# Patient Record
Sex: Female | Born: 1995 | Race: Black or African American | Hispanic: No | Marital: Single | Smoking: Never smoker
Health system: Southern US, Community
[De-identification: ages and names within clinical notes are randomized; demographics above are authoritative.]

## PROBLEM LIST (undated history)

## (undated) ENCOUNTER — Ambulatory Visit (HOSPITAL_COMMUNITY): Admission: EM | Source: Home / Self Care

## (undated) DIAGNOSIS — J45909 Unspecified asthma, uncomplicated: Secondary | ICD-10-CM

---
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
## 2017-02-22 ENCOUNTER — Emergency Department (HOSPITAL_COMMUNITY)

## 2017-02-22 ENCOUNTER — Encounter (HOSPITAL_COMMUNITY): Payer: Self-pay | Admitting: Emergency Medical Services

## 2017-02-22 ENCOUNTER — Emergency Department (HOSPITAL_COMMUNITY)
Admission: EM | Admit: 2017-02-22 | Discharge: 2017-02-22 | Disposition: A | Discharge status: Home / Self Care | Attending: Emergency Medicine | Admitting: Emergency Medicine

## 2017-02-22 ENCOUNTER — Other Ambulatory Visit: Payer: Self-pay

## 2017-02-22 DIAGNOSIS — R059 Cough, unspecified: Secondary | ICD-10-CM

## 2017-02-22 DIAGNOSIS — R05 Cough: Secondary | ICD-10-CM | POA: Diagnosis present

## 2017-02-22 MED ORDER — DEXAMETHASONE SODIUM PHOSPHATE 10 MG/ML IJ SOLN
10.0000 mg | Freq: Once | INTRAMUSCULAR | Status: AC
  Administered 2017-02-22: 10 mg via INTRAMUSCULAR
  Filled 2017-02-22: qty 1

## 2017-02-22 MED ORDER — ALBUTEROL SULFATE HFA 108 (90 BASE) MCG/ACT IN AERS
2.0000 | INHALATION_SPRAY | Freq: Once | RESPIRATORY_TRACT | Status: AC
  Administered 2017-02-22: 2 via RESPIRATORY_TRACT
  Filled 2017-02-22: qty 6.7

## 2017-02-22 MED ORDER — IPRATROPIUM-ALBUTEROL 0.5-2.5 (3) MG/3ML IN SOLN
3.0000 mL | Freq: Once | RESPIRATORY_TRACT | Status: AC
  Administered 2017-02-22: 3 mL via RESPIRATORY_TRACT
  Filled 2017-02-22: qty 3

## 2017-02-22 NOTE — Discharge Instructions (Signed)
2 puffs of albuterol inhaler every 4hrs.  Your chest xray was reassuring.   Please call and schedule appointment with [facility] to establish care and for follow up.  Return to the ER if you have shortness of breath that does not improve with inhaler or have any new or worsening symptoms.

## 2017-02-22 NOTE — ED Triage Notes (Signed)
Patient presents with cough, runny nose and diarrhea x 10 days. Productive cough.

## 2017-02-22 NOTE — ED Notes (Signed)
Patient transported to X-ray 

## 2017-02-22 NOTE — ED Provider Notes (Signed)
[facility]   CSN: [id number] Arrival date & time: 02/22/17  [id number]     History   Chief Complaint Chief Complaint  Patient presents with  . Cough    HPI Kaitlyn Mcneil is a 22 y.o. female.  HPI  Kaitlyn Mcneil is a [place]21yo female with a history of asthma, allergic rhinitis and eczema who presents to the emergency department for evaluation of cough.  Patient states that her cough began about 10 days ago and is productive of a clear mucus.  States that she has some associated wheezing and has been needing to use her albuterol inhaler more often than usual (approximately 2 times a day.)  She normally does not have to use her albuterol inhaler.  States that this morning she felt short of breath when she woke up, which concerned her.  States that the albuterol helped with her shortness of breath.  States that she also has some nasal congestion.  Reports diarrhea about twice a day for the past several days. Denies recent travel outside of the country or recent antibiotic use. She denies fevers, chills, sore throat, ear pain, body aches, chest pain, abdominal pain, nausea/vomiting, hematochezia, melena, dysuria, urinary frequency, pelvic pain,  No past medical history on file.  There are no active problems to display for this patient.   History reviewed. No pertinent surgical history.  OB History    No data available       Home Medications    Prior to Admission medications   Not on File    Family History No family history on file.  Social History Social History   Tobacco Use  . Smoking status: Not on file  Substance Use Topics  . Alcohol use: Not on file  . Drug use: Not on file     Allergies   Shellfish allergy   Review of Systems Review of Systems  Constitutional: Negative for chills and fever.  HENT: Positive for congestion. Negative for sore throat and trouble swallowing.   Respiratory: Positive for cough,  shortness of breath and wheezing.   Cardiovascular: Negative for chest pain and leg swelling.  Gastrointestinal: Positive for diarrhea. Negative for abdominal pain, blood in stool, nausea and vomiting.  Genitourinary: Negative for difficulty urinating, dysuria, flank pain, frequency and pelvic pain.  Musculoskeletal: Negative for gait problem.  Neurological: Negative for headaches.     Physical Exam Updated Vital Signs BP 131/80 (BP Location: Right Arm)   Pulse (!) 115   Temp 99 F (37.2 C) (Oral)   Ht 4' 11.5" (1.511 m)   Wt 50.3 kg (111 lb)   LMP 12/20/2016 (Approximate) Comment: double shielded pt for CXR  SpO2 100%   BMI 22.04 kg/m   Physical Exam  Constitutional: She is oriented to person, place, and time. She appears well-developed and well-nourished. No distress.  HENT:  Head: Normocephalic and atraumatic.  Mouth/Throat: Oropharynx is clear and moist. No oropharyngeal exudate.  Eyes: Conjunctivae are normal. Pupils are equal, round, and reactive to light. Right eye exhibits no discharge. Left eye exhibits no discharge.  Neck: Normal range of motion. Neck supple. No JVD present. No tracheal deviation present.  Cardiovascular: Normal rate, regular rhythm and intact distal pulses. Exam reveals no friction rub.  No murmur heard. Pulmonary/Chest: Effort normal. No respiratory distress.  No respiratory distress.  Speaking in full sentences.  Good air movement.  Inspiratory and expiratory wheezing heard in all lung fields.  No crackles, stridor or  rhonchi.  Abdominal: Soft. Bowel sounds are normal. There is no tenderness. There is no guarding.  Musculoskeletal: Normal range of motion.  Neurological: She is alert and oriented to person, place, and time. Coordination normal.  Skin: Skin is warm and dry. Capillary refill takes less than 2 seconds. She is not diaphoretic.  Psychiatric: She has a normal mood and affect. Her behavior is normal.  Nursing note and vitals  reviewed.    ED Treatments / Results  Labs (all labs ordered are listed, but only abnormal results are displayed) Labs Reviewed - No data to display  EKG  EKG Interpretation None       Radiology Dg Chest 2 View  Result Date: 02/22/2017 CLINICAL DATA:  Productive cough EXAM: CHEST  2 VIEW COMPARISON:  None. FINDINGS: The heart size and mediastinal contours are within normal limits. Both lungs are clear. The visualized skeletal structures are unremarkable. IMPRESSION: No active cardiopulmonary disease. Electronically Signed   By: Marlan Palau M.D.   On: 02/22/2017 11:04    Procedures Procedures (including critical care time)  Medications Ordered in ED Medications  ipratropium-albuterol (DUONEB) 0.5-2.5 (3) MG/3ML nebulizer solution 3 mL (3 mLs Nebulization Given 02/22/17 1226)  dexamethasone (DECADRON) injection 10 mg (10 mg Intramuscular Given 02/22/17 1227)  albuterol (PROVENTIL HFA;VENTOLIN HFA) 108 (90 Base) MCG/ACT inhaler 2 puff (2 puffs Inhalation Given 02/22/17 1226)     Initial Impression / Assessment and Plan / ED Course  I have reviewed the triage vital signs and the nursing notes.  Pertinent labs & imaging results that were available during my care of the patient were reviewed by me and considered in my medical decision making (see chart for details).    CXR without acute abnormality. Patient ambulated in ED with O2 saturations maintained >90, no signs of respiratory distress. Lung exam improved after nebulizer treatment. She is mildly tachycardic (pulse 115), likely related to albuterol breathing treatment.  Pt states they she is breathing at baseline.  Decadron given in the ED. Pt has been instructed to continue using prescribed albuterol as needed. She does not have a PCP, have provided her with information to establish care with [facility] in her discharge paperwork. In terms of patient's diarrhea, no signs of dehydration. No abdominal tenderness on exam. She  reports 1-2 episodes of diarrhea daily. Given presentation, do not suspect infectious diarrhea, diverticulitis, colitis. Have counseled her to use imodium as needed for diarrhea and follow up with PCP. Counseled patient on return precautions. Patient agrees with plan and voices understanding to the above plan.   Final Clinical Impressions(s) / ED Diagnoses   Final diagnoses:  Cough    ED Discharge Orders    None       Lawrence Marseilles 02/22/17 1827    Alvira Monday, MD 02/22/17 2148

## 2019-04-05 IMAGING — DX DG CHEST 2V
2 series · 2 of 2 positions shown · non-contrast
Comparison: None.

CLINICAL DATA: Productive cough

EXAM:
CHEST  2 VIEW

[chest pa]
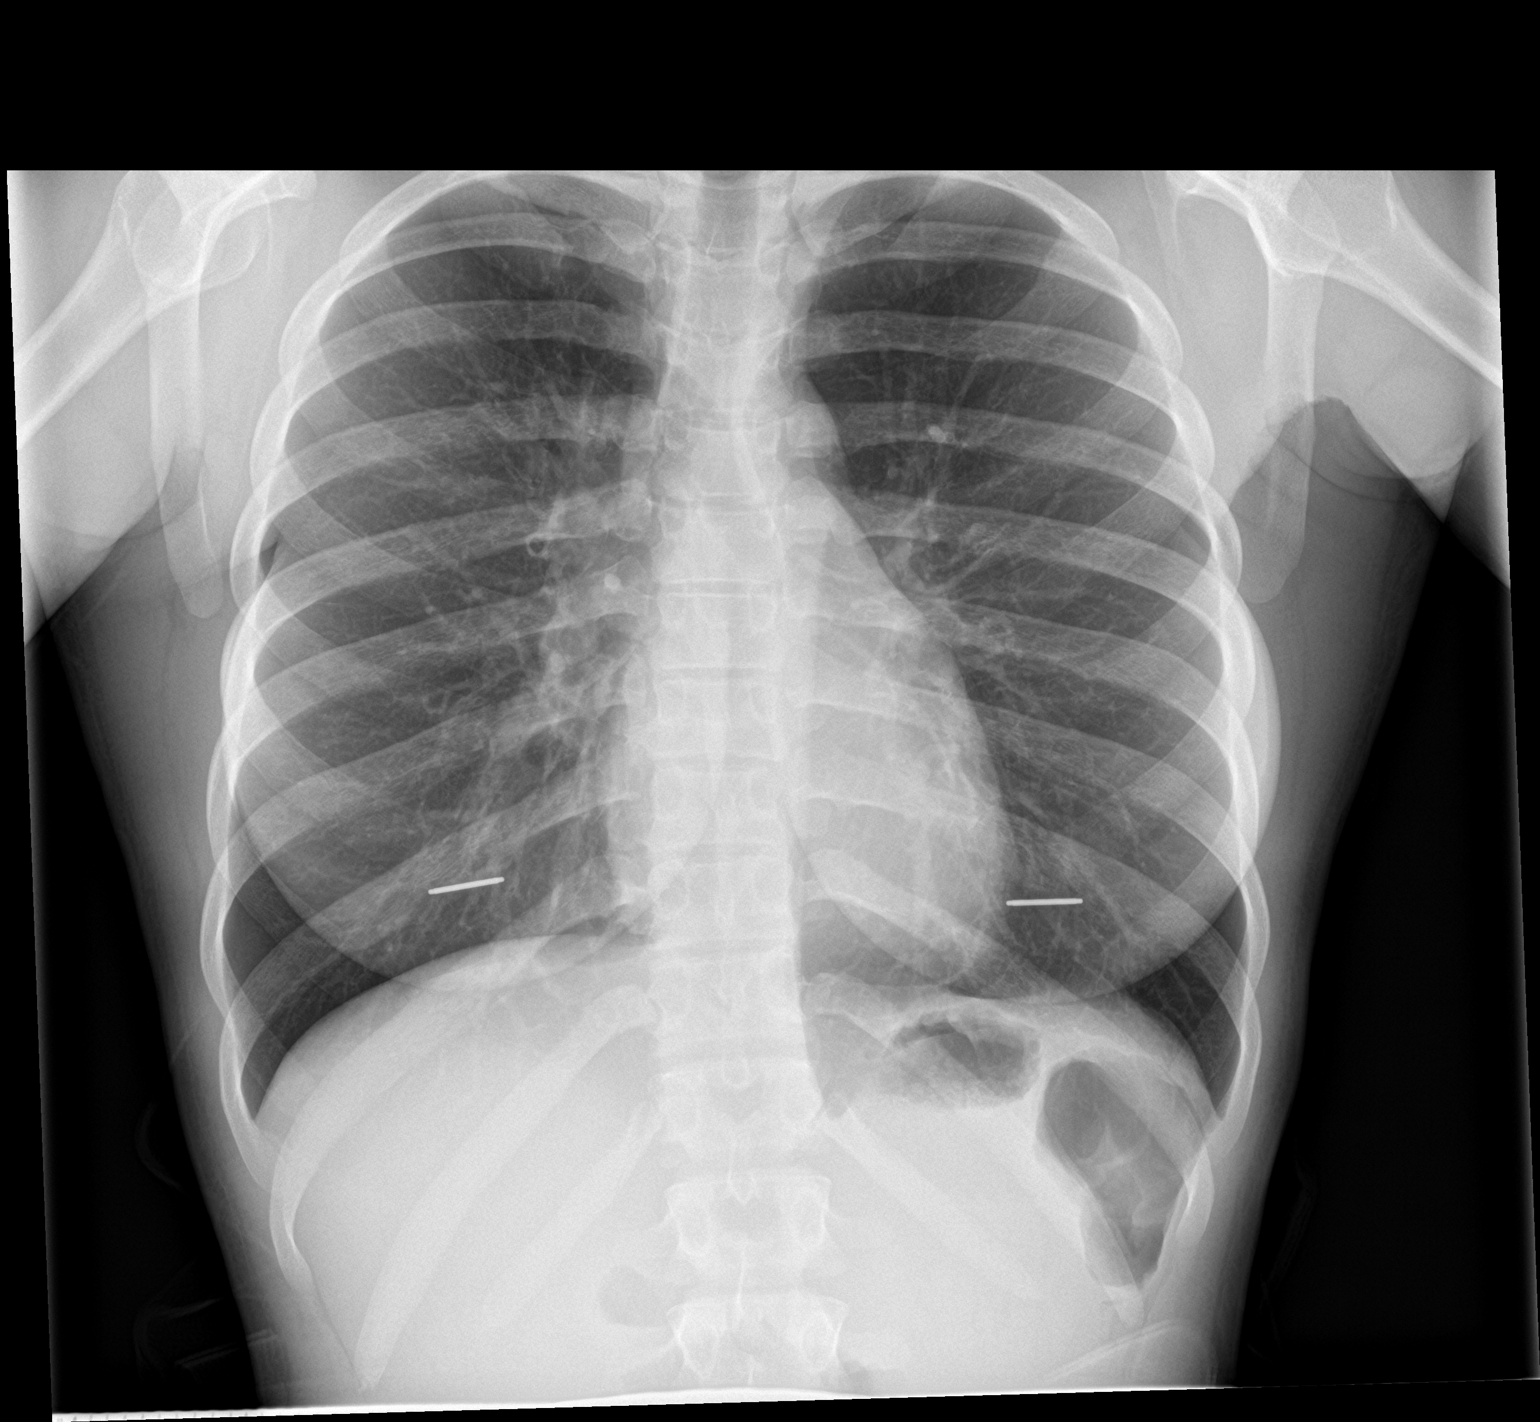

[chest lat]
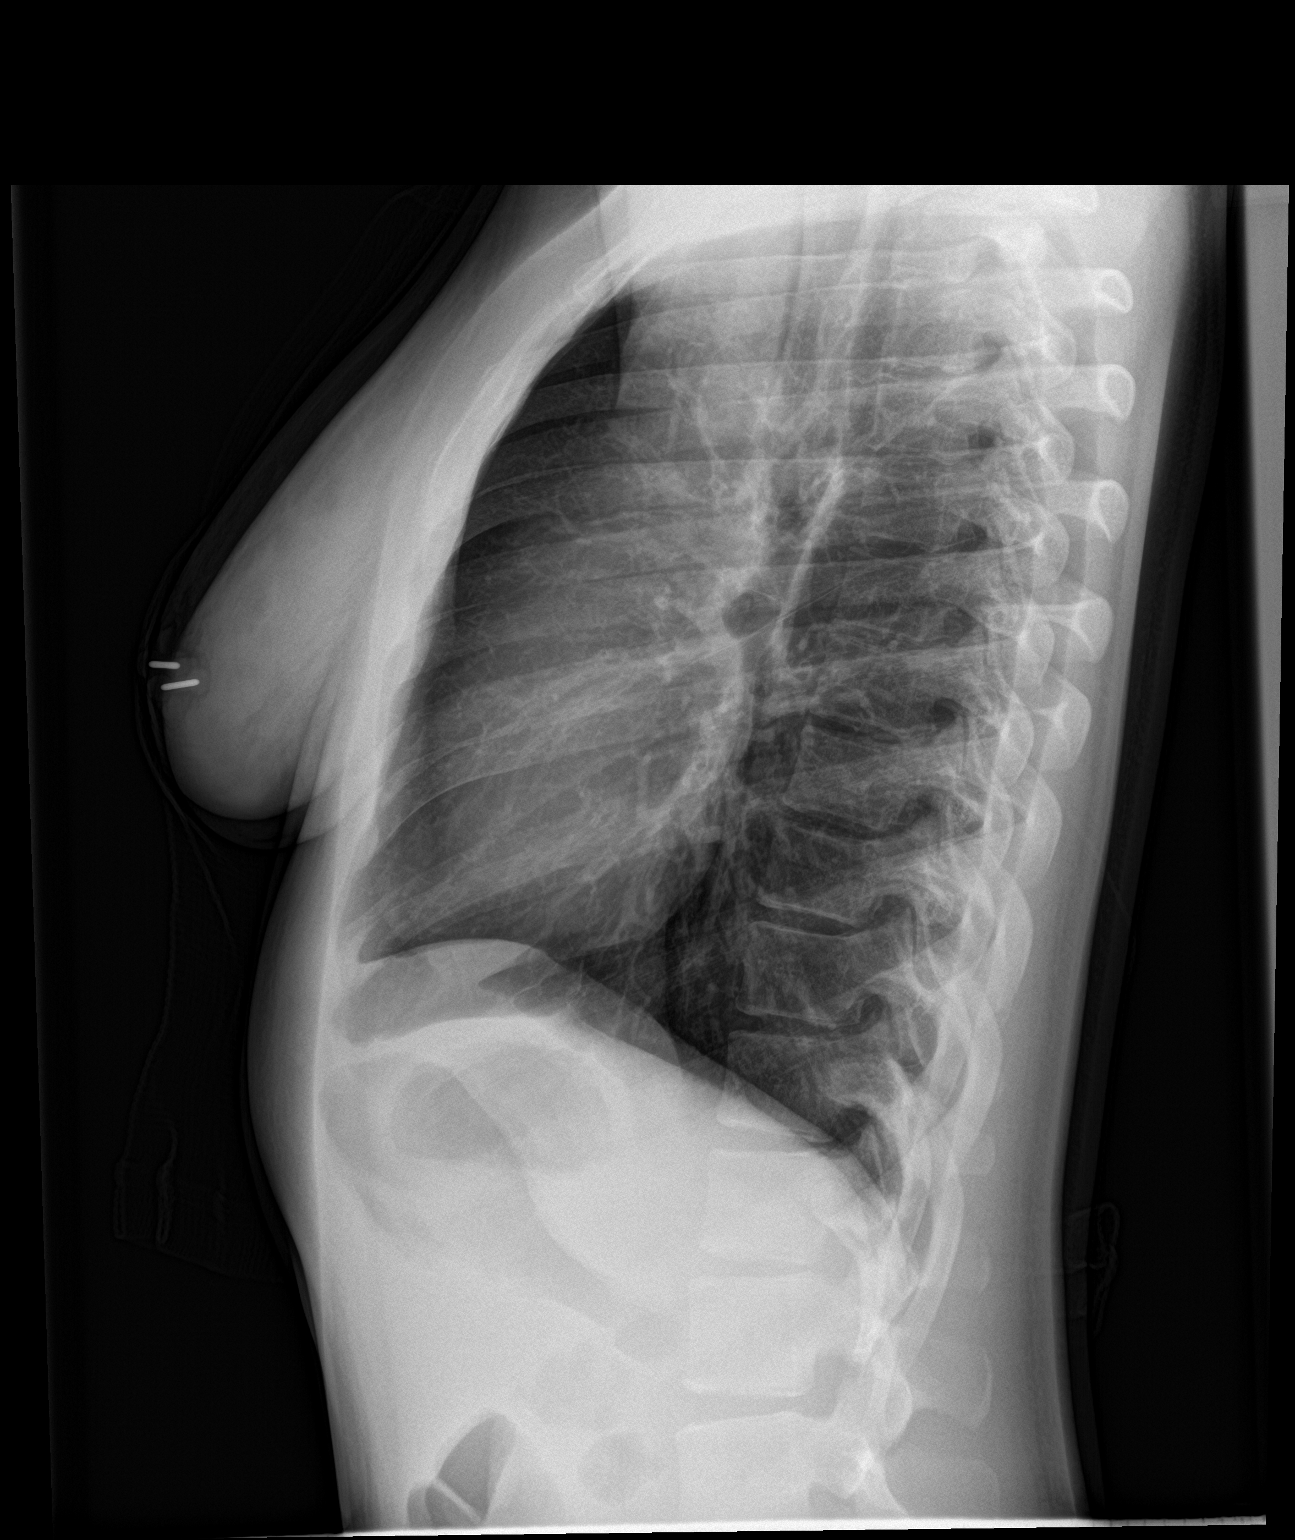

[2 of 2 positions shown; findings below may reference images not displayed]

FINDINGS: The heart size and mediastinal contours are within normal limits.
Both lungs are clear. The visualized skeletal structures are
unremarkable.
IMPRESSION: No active cardiopulmonary disease.

## 2022-02-07 ENCOUNTER — Inpatient Hospital Stay (HOSPITAL_COMMUNITY)
Admission: EM | Disposition: A | Discharge status: Rehabilitation Facility | Payer: Self-pay | Source: Home / Self Care | Attending: Pulmonary Disease

## 2022-02-07 ENCOUNTER — Inpatient Hospital Stay (HOSPITAL_COMMUNITY): Payer: BC Managed Care – PPO | Admitting: Anesthesiology

## 2022-02-07 ENCOUNTER — Inpatient Hospital Stay (HOSPITAL_COMMUNITY): Payer: BC Managed Care – PPO

## 2022-02-07 ENCOUNTER — Encounter (HOSPITAL_COMMUNITY): Payer: Self-pay

## 2022-02-07 ENCOUNTER — Emergency Department (HOSPITAL_COMMUNITY): Payer: BC Managed Care – PPO

## 2022-02-07 ENCOUNTER — Encounter (HOSPITAL_COMMUNITY)
Admission: EM | Disposition: A | Discharge status: Rehabilitation Facility | Payer: Self-pay | Source: Home / Self Care | Attending: Pulmonary Disease

## 2022-02-07 ENCOUNTER — Other Ambulatory Visit: Payer: Self-pay

## 2022-02-07 ENCOUNTER — Inpatient Hospital Stay (HOSPITAL_COMMUNITY)
Admission: EM | Admit: 2022-02-07 | Discharge: 2022-02-25 | DRG: 003 | Disposition: A | Discharge status: Rehabilitation Facility | Payer: BC Managed Care – PPO | Attending: Family Medicine | Admitting: Family Medicine

## 2022-02-07 DIAGNOSIS — Z79899 Other long term (current) drug therapy: Secondary | ICD-10-CM

## 2022-02-07 DIAGNOSIS — A4189 Other specified sepsis: Principal | ICD-10-CM | POA: Diagnosis present

## 2022-02-07 DIAGNOSIS — R0603 Acute respiratory distress: Secondary | ICD-10-CM

## 2022-02-07 DIAGNOSIS — Z4682 Encounter for fitting and adjustment of non-vascular catheter: Secondary | ICD-10-CM | POA: Diagnosis not present

## 2022-02-07 DIAGNOSIS — J9601 Acute respiratory failure with hypoxia: Secondary | ICD-10-CM | POA: Diagnosis not present

## 2022-02-07 DIAGNOSIS — R1312 Dysphagia, oropharyngeal phase: Secondary | ICD-10-CM | POA: Diagnosis present

## 2022-02-07 DIAGNOSIS — Z91018 Allergy to other foods: Secondary | ICD-10-CM

## 2022-02-07 DIAGNOSIS — T380X5A Adverse effect of glucocorticoids and synthetic analogues, initial encounter: Secondary | ICD-10-CM | POA: Diagnosis present

## 2022-02-07 DIAGNOSIS — J4552 Severe persistent asthma with status asthmaticus: Secondary | ICD-10-CM | POA: Diagnosis present

## 2022-02-07 DIAGNOSIS — R531 Weakness: Secondary | ICD-10-CM

## 2022-02-07 DIAGNOSIS — Z91013 Allergy to seafood: Secondary | ICD-10-CM | POA: Diagnosis not present

## 2022-02-07 DIAGNOSIS — Z888 Allergy status to other drugs, medicaments and biological substances status: Secondary | ICD-10-CM

## 2022-02-07 DIAGNOSIS — W19XXXA Unspecified fall, initial encounter: Secondary | ICD-10-CM | POA: Diagnosis present

## 2022-02-07 DIAGNOSIS — D509 Iron deficiency anemia, unspecified: Secondary | ICD-10-CM | POA: Diagnosis present

## 2022-02-07 DIAGNOSIS — I10 Essential (primary) hypertension: Secondary | ICD-10-CM | POA: Diagnosis present

## 2022-02-07 DIAGNOSIS — B9789 Other viral agents as the cause of diseases classified elsewhere: Secondary | ICD-10-CM | POA: Diagnosis present

## 2022-02-07 DIAGNOSIS — J9602 Acute respiratory failure with hypercapnia: Secondary | ICD-10-CM | POA: Diagnosis not present

## 2022-02-07 DIAGNOSIS — R410 Disorientation, unspecified: Secondary | ICD-10-CM | POA: Insufficient documentation

## 2022-02-07 DIAGNOSIS — K567 Ileus, unspecified: Secondary | ICD-10-CM | POA: Diagnosis not present

## 2022-02-07 DIAGNOSIS — J984 Other disorders of lung: Secondary | ICD-10-CM | POA: Diagnosis not present

## 2022-02-07 DIAGNOSIS — Z1152 Encounter for screening for COVID-19: Secondary | ICD-10-CM

## 2022-02-07 DIAGNOSIS — I161 Hypertensive emergency: Secondary | ICD-10-CM | POA: Diagnosis present

## 2022-02-07 DIAGNOSIS — F515 Nightmare disorder: Secondary | ICD-10-CM | POA: Diagnosis not present

## 2022-02-07 DIAGNOSIS — E876 Hypokalemia: Secondary | ICD-10-CM | POA: Diagnosis present

## 2022-02-07 DIAGNOSIS — R7401 Elevation of levels of liver transaminase levels: Secondary | ICD-10-CM | POA: Diagnosis present

## 2022-02-07 DIAGNOSIS — I440 Atrioventricular block, first degree: Secondary | ICD-10-CM | POA: Diagnosis present

## 2022-02-07 DIAGNOSIS — R5381 Other malaise: Secondary | ICD-10-CM | POA: Diagnosis not present

## 2022-02-07 DIAGNOSIS — J45901 Unspecified asthma with (acute) exacerbation: Secondary | ICD-10-CM

## 2022-02-07 DIAGNOSIS — L309 Dermatitis, unspecified: Secondary | ICD-10-CM | POA: Diagnosis present

## 2022-02-07 DIAGNOSIS — T4275XA Adverse effect of unspecified antiepileptic and sedative-hypnotic drugs, initial encounter: Secondary | ICD-10-CM | POA: Diagnosis present

## 2022-02-07 DIAGNOSIS — K30 Functional dyspepsia: Secondary | ICD-10-CM | POA: Diagnosis present

## 2022-02-07 DIAGNOSIS — E870 Hyperosmolality and hypernatremia: Secondary | ICD-10-CM | POA: Diagnosis present

## 2022-02-07 DIAGNOSIS — Z6830 Body mass index (BMI) 30.0-30.9, adult: Secondary | ICD-10-CM

## 2022-02-07 DIAGNOSIS — Y92239 Unspecified place in hospital as the place of occurrence of the external cause: Secondary | ICD-10-CM | POA: Diagnosis present

## 2022-02-07 DIAGNOSIS — J90 Pleural effusion, not elsewhere classified: Secondary | ICD-10-CM | POA: Diagnosis not present

## 2022-02-07 DIAGNOSIS — D62 Acute posthemorrhagic anemia: Secondary | ICD-10-CM | POA: Diagnosis present

## 2022-02-07 DIAGNOSIS — J9811 Atelectasis: Secondary | ICD-10-CM | POA: Diagnosis not present

## 2022-02-07 DIAGNOSIS — A419 Sepsis, unspecified organism: Secondary | ICD-10-CM | POA: Insufficient documentation

## 2022-02-07 DIAGNOSIS — R6520 Severe sepsis without septic shock: Secondary | ICD-10-CM | POA: Diagnosis not present

## 2022-02-07 DIAGNOSIS — Z452 Encounter for adjustment and management of vascular access device: Secondary | ICD-10-CM | POA: Diagnosis not present

## 2022-02-07 DIAGNOSIS — D6489 Other specified anemias: Secondary | ICD-10-CM | POA: Diagnosis present

## 2022-02-07 DIAGNOSIS — G9341 Metabolic encephalopathy: Secondary | ICD-10-CM | POA: Diagnosis present

## 2022-02-07 DIAGNOSIS — R7303 Prediabetes: Secondary | ICD-10-CM | POA: Diagnosis not present

## 2022-02-07 DIAGNOSIS — J45909 Unspecified asthma, uncomplicated: Secondary | ICD-10-CM

## 2022-02-07 DIAGNOSIS — R000 Tachycardia, unspecified: Secondary | ICD-10-CM | POA: Diagnosis not present

## 2022-02-07 DIAGNOSIS — R0602 Shortness of breath: Secondary | ICD-10-CM | POA: Diagnosis not present

## 2022-02-07 DIAGNOSIS — E872 Acidosis, unspecified: Secondary | ICD-10-CM | POA: Diagnosis present

## 2022-02-07 DIAGNOSIS — R918 Other nonspecific abnormal finding of lung field: Secondary | ICD-10-CM | POA: Diagnosis not present

## 2022-02-07 DIAGNOSIS — I952 Hypotension due to drugs: Secondary | ICD-10-CM | POA: Diagnosis present

## 2022-02-07 DIAGNOSIS — E669 Obesity, unspecified: Secondary | ICD-10-CM | POA: Diagnosis not present

## 2022-02-07 DIAGNOSIS — Z597 Insufficient social insurance and welfare support: Secondary | ICD-10-CM

## 2022-02-07 DIAGNOSIS — Z8619 Personal history of other infectious and parasitic diseases: Secondary | ICD-10-CM | POA: Diagnosis not present

## 2022-02-07 DIAGNOSIS — J9690 Respiratory failure, unspecified, unspecified whether with hypoxia or hypercapnia: Secondary | ICD-10-CM

## 2022-02-07 DIAGNOSIS — I619 Nontraumatic intracerebral hemorrhage, unspecified: Secondary | ICD-10-CM | POA: Diagnosis not present

## 2022-02-07 DIAGNOSIS — J45902 Unspecified asthma with status asthmaticus: Secondary | ICD-10-CM | POA: Diagnosis not present

## 2022-02-07 DIAGNOSIS — J9600 Acute respiratory failure, unspecified whether with hypoxia or hypercapnia: Secondary | ICD-10-CM | POA: Diagnosis not present

## 2022-02-07 DIAGNOSIS — G245 Blepharospasm: Secondary | ICD-10-CM | POA: Diagnosis present

## 2022-02-07 HISTORY — PX: ECMO CANNULATION: CATH118321

## 2022-02-07 HISTORY — PX: CANNULATION FOR ECMO (EXTRACORPOREAL MEMBRANE OXYGENATION): SHX6796

## 2022-02-07 HISTORY — PX: TEE WITHOUT CARDIOVERSION: SHX5443

## 2022-02-07 HISTORY — DX: Unspecified asthma, uncomplicated: J45.909

## 2022-02-07 LAB — BASIC METABOLIC PANEL
Anion gap: 12 (ref 5–15)
BUN: 6 mg/dL (ref 6–20)
CO2: 22 mmol/L (ref 22–32)
Calcium: 9.2 mg/dL (ref 8.9–10.3)
Chloride: 101 mmol/L (ref 98–111)
Creatinine, Ser: 0.67 mg/dL (ref 0.44–1.00)
GFR, Estimated: >60 mL/min (ref >60)
Glucose, Bld: 96 mg/dL (ref 70–99)
Potassium: 3.1 mmol/L — ABNORMAL LOW (ref 3.5–5.1)
Sodium: 135 mmol/L (ref 135–145)

## 2022-02-07 LAB — POCT I-STAT 7, (LYTES, BLD GAS, ICA,H+H)
Acid-Base Excess: 0 mmol/L (ref 0.0–2.0)
Acid-base deficit: 4 mmol/L — ABNORMAL HIGH (ref 0.0–2.0)
Acid-base deficit: 3 mmol/L — ABNORMAL HIGH (ref 0.0–2.0)
Bicarbonate: 30 mmol/L — ABNORMAL HIGH (ref 20.0–28.0)
Bicarbonate: 32.2 mmol/L — ABNORMAL HIGH (ref 20.0–28.0)
Bicarbonate: 29.6 mmol/L — ABNORMAL HIGH (ref 20.0–28.0)
Calcium, Ion: 1.25 mmol/L (ref 1.15–1.40)
Calcium, Ion: 1.16 mmol/L (ref 1.15–1.40)
Calcium, Ion: 1.09 mmol/L — ABNORMAL LOW (ref 1.15–1.40)
HCT: 43 % (ref 36.0–46.0)
HCT: 38 % (ref 36.0–46.0)
HCT: 37 % (ref 36.0–46.0)
Hemoglobin: 14.6 g/dL (ref 12.0–15.0)
Hemoglobin: 12.9 g/dL (ref 12.0–15.0)
Hemoglobin: 12.6 g/dL (ref 12.0–15.0)
O2 Saturation: 100 %
O2 Saturation: 97 %
O2 Saturation: 97 %
Patient temperature: 97.9
Patient temperature: 97.7
Patient temperature: 97.7
Potassium: 3.5 mmol/L (ref 3.5–5.1)
Potassium: 4.1 mmol/L (ref 3.5–5.1)
Potassium: 3.3 mmol/L — ABNORMAL LOW (ref 3.5–5.1)
Sodium: 137 mmol/L (ref 135–145)
Sodium: 139 mmol/L (ref 135–145)
Sodium: 142 mmol/L (ref 135–145)
TCO2: 33 mmol/L — ABNORMAL HIGH (ref 22–32)
TCO2: 35 mmol/L — ABNORMAL HIGH (ref 22–32)
TCO2: 33 mmol/L — ABNORMAL HIGH (ref 22–32)
pCO2 arterial: 103.8 mmHg (ref 32–48)
pCO2 arterial: 98.9 mmHg (ref 32–48)
pCO2 arterial: 99.4 mmHg (ref 32–48)
pH, Arterial: 7.066 — CL (ref 7.35–7.45)
pH, Arterial: 7.117 — CL (ref 7.35–7.45)
pH, Arterial: 7.079 — CL (ref 7.35–7.45)
pO2, Arterial: 474 mmHg — ABNORMAL HIGH (ref 83–108)
pO2, Arterial: 124 mmHg — ABNORMAL HIGH (ref 83–108)
pO2, Arterial: 137 mmHg — ABNORMAL HIGH (ref 83–108)

## 2022-02-07 LAB — URINALYSIS, ROUTINE W REFLEX MICROSCOPIC
Bilirubin Urine: NEGATIVE
Glucose, UA: NEGATIVE mg/dL
Hgb urine dipstick: NEGATIVE
Ketones, ur: 5 mg/dL — AB
Leukocytes,Ua: NEGATIVE
Nitrite: NEGATIVE
Protein, ur: NEGATIVE mg/dL
Specific Gravity, Urine: 1.012 (ref 1.005–1.030)
pH: 5 (ref 5.0–8.0)

## 2022-02-07 LAB — CBC WITH DIFFERENTIAL/PLATELET
Abs Immature Granulocytes: 0.07 10*3/uL (ref 0.00–0.07)
Basophils Absolute: 0 10*3/uL (ref 0.0–0.1)
Basophils Relative: 0 %
Eosinophils Absolute: 0.3 10*3/uL (ref 0.0–0.5)
Eosinophils Relative: 2 %
HCT: 38.7 % (ref 36.0–46.0)
Hemoglobin: 12.7 g/dL (ref 12.0–15.0)
Immature Granulocytes: 0 %
Lymphocytes Relative: 8 %
Lymphs Abs: 1.4 10*3/uL (ref 0.7–4.0)
MCH: 27.3 pg (ref 26.0–34.0)
MCHC: 32.8 g/dL (ref 30.0–36.0)
MCV: 83 fL (ref 80.0–100.0)
Monocytes Absolute: 1.1 10*3/uL — ABNORMAL HIGH (ref 0.1–1.0)
Monocytes Relative: 7 %
Neutro Abs: 13.9 10*3/uL — ABNORMAL HIGH (ref 1.7–7.7)
Neutrophils Relative %: 83 %
Platelets: 283 10*3/uL (ref 150–400)
RBC: 4.66 MIL/uL (ref 3.87–5.11)
RDW: 16 % — ABNORMAL HIGH (ref 11.5–15.5)
WBC: 16.7 10*3/uL — ABNORMAL HIGH (ref 4.0–10.5)
nRBC: 0 % (ref 0.0–0.2)

## 2022-02-07 LAB — PROCALCITONIN: Procalcitonin: <0.1 ng/mL

## 2022-02-07 LAB — RESP PANEL BY RT-PCR (RSV, FLU A&B, COVID)  RVPGX2
Influenza A by PCR: NEGATIVE
Influenza B by PCR: NEGATIVE
Resp Syncytial Virus by PCR: NEGATIVE
SARS Coronavirus 2 by RT PCR: NEGATIVE

## 2022-02-07 LAB — LACTIC ACID, PLASMA
Lactic Acid, Venous: 1.1 mmol/L (ref 0.5–1.9)
Lactic Acid, Venous: 5.5 mmol/L (ref 0.5–1.9)

## 2022-02-07 LAB — GLUCOSE, CAPILLARY
Glucose-Capillary: 182 mg/dL — ABNORMAL HIGH (ref 70–99)
Glucose-Capillary: 179 mg/dL — ABNORMAL HIGH (ref 70–99)

## 2022-02-07 LAB — ABO/RH: ABO/RH(D): O POS

## 2022-02-07 LAB — PREPARE RBC (CROSSMATCH)

## 2022-02-07 LAB — HIV ANTIBODY (ROUTINE TESTING W REFLEX): HIV Screen 4th Generation wRfx: NONREACTIVE

## 2022-02-07 LAB — MRSA NEXT GEN BY PCR, NASAL: MRSA by PCR Next Gen: NOT DETECTED

## 2022-02-07 LAB — SEDIMENTATION RATE: Sed Rate: 7 mm/hr (ref 0–22)

## 2022-02-07 LAB — POCT ACTIVATED CLOTTING TIME: Activated Clotting Time: 141 seconds

## 2022-02-07 SURGERY — CANNULATION FOR ECMO (EXTRACORPOREAL MEMBRANE OXYGENATION)
Anesthesia: General

## 2022-02-07 SURGERY — ECMO CANNULATION
Anesthesia: LOCAL

## 2022-02-07 MED ORDER — CLEVIDIPINE BUTYRATE 0.5 MG/ML IV EMUL: 0.0000 mg/h | INTRAVENOUS | Status: DC

## 2022-02-07 MED ORDER — NOREPINEPHRINE 4 MG/250ML-% IV SOLN
2.0000 ug/min | INTRAVENOUS | Status: DC
  Administered 2022-02-07: 2 ug/min via INTRAVENOUS
  Filled 2022-02-07: qty 250

## 2022-02-07 MED ORDER — HEPARIN (PORCINE) IN NACL 1000-0.9 UT/500ML-% IV SOLN
INTRAVENOUS | Status: DC | PRN
  Administered 2022-02-07: 500 mL

## 2022-02-07 MED ORDER — METHYLPREDNISOLONE SODIUM SUCC 125 MG IJ SOLR: 120.0000 mg | Freq: Two times a day (BID) | INTRAMUSCULAR | Status: DC

## 2022-02-07 MED ORDER — SODIUM BICARBONATE 8.4 % IV SOLN
INTRAVENOUS | Status: AC
  Administered 2022-02-07: 50 meq
  Filled 2022-02-07: qty 50

## 2022-02-07 MED ORDER — ROCURONIUM BROMIDE 10 MG/ML (PF) SYRINGE
PREFILLED_SYRINGE | INTRAVENOUS | Status: AC
  Filled 2022-02-07: qty 10

## 2022-02-07 MED ORDER — SUCCINYLCHOLINE CHLORIDE 20 MG/ML IJ SOLN
INTRAMUSCULAR | Status: DC | PRN
  Administered 2022-02-07: 120 mg via INTRAVENOUS

## 2022-02-07 MED ORDER — CEFAZOLIN SODIUM-DEXTROSE 2-3 GM-%(50ML) IV SOLR
INTRAVENOUS | Status: DC | PRN
  Administered 2022-02-07: 2 g via INTRAVENOUS

## 2022-02-07 MED ORDER — MAGNESIUM SULFATE 2 GM/50ML IV SOLN
2.0000 g | INTRAVENOUS | Status: AC
  Administered 2022-02-08: 2 g via INTRAVENOUS
  Filled 2022-02-07: qty 50

## 2022-02-07 MED ORDER — FENTANYL BOLUS VIA INFUSION
50.0000 ug | INTRAVENOUS | Status: DC | PRN
  Administered 2022-02-07 – 2022-02-08 (×3): 50 ug via INTRAVENOUS
  Filled 2022-02-07 (×3): qty 50

## 2022-02-07 MED ORDER — FENTANYL CITRATE PF 50 MCG/ML IJ SOSY: 50.0000 ug | PREFILLED_SYRINGE | Freq: Once | INTRAMUSCULAR | Status: AC

## 2022-02-07 MED ORDER — FENTANYL 2500MCG IN NS 250ML (10MCG/ML) PREMIX INFUSION
0.0000 ug/h | INTRAVENOUS | Status: DC
  Administered 2022-02-08: 200 ug/h via INTRAVENOUS
  Administered 2022-02-08 – 2022-02-09 (×2): 250 ug/h via INTRAVENOUS
  Administered 2022-02-09: 275 ug/h via INTRAVENOUS
  Administered 2022-02-09: 300 ug/h via INTRAVENOUS
  Administered 2022-02-10 (×2): 400 ug/h via INTRAVENOUS
  Administered 2022-02-10: 300 ug/h via INTRAVENOUS
  Administered 2022-02-11: 400 ug/h via INTRAVENOUS
  Administered 2022-02-11: 300 ug/h via INTRAVENOUS
  Administered 2022-02-11: 350 ug/h via INTRAVENOUS
  Administered 2022-02-12: 300 ug/h via INTRAVENOUS
  Administered 2022-02-12: 200 ug/h via INTRAVENOUS
  Administered 2022-02-13: 150 ug/h via INTRAVENOUS
  Administered 2022-02-13: 200 ug/h via INTRAVENOUS
  Administered 2022-02-14: 125 ug/h via INTRAVENOUS
  Administered 2022-02-15: 100 ug/h via INTRAVENOUS
  Administered 2022-02-16 – 2022-02-17 (×2): 200 ug/h via INTRAVENOUS
  Filled 2022-02-07: qty 250
  Filled 2022-02-07: qty 500
  Filled 2022-02-07 (×16): qty 250

## 2022-02-07 MED ORDER — SODIUM CHLORIDE 0.9 % IV SOLN: INTRAVENOUS | Status: DC | PRN

## 2022-02-07 MED ORDER — BUDESONIDE 0.5 MG/2ML IN SUSP
0.5000 mg | Freq: Two times a day (BID) | RESPIRATORY_TRACT | Status: DC
  Administered 2022-02-07: 0.5 mg via RESPIRATORY_TRACT
  Filled 2022-02-07: qty 2

## 2022-02-07 MED ORDER — MONTELUKAST SODIUM 10 MG PO TABS
10.0000 mg | ORAL_TABLET | Freq: Every day | ORAL | Status: DC
  Administered 2022-02-08 – 2022-02-19 (×11): 10 mg
  Filled 2022-02-07 (×12): qty 1

## 2022-02-07 MED ORDER — CISATRACURIUM BOLUS VIA INFUSION
0.1000 mg/kg | Freq: Once | INTRAVENOUS | Status: AC
  Administered 2022-02-07: 6.8 mg via INTRAVENOUS
  Filled 2022-02-07: qty 7

## 2022-02-07 MED ORDER — KETAMINE HCL 10 MG/ML IJ SOLN
INTRAMUSCULAR | Status: DC | PRN
  Administered 2022-02-07: 70 mg via INTRAVENOUS

## 2022-02-07 MED ORDER — MAGNESIUM SULFATE 2 GM/50ML IV SOLN
2.0000 g | Freq: Once | INTRAVENOUS | Status: AC
  Administered 2022-02-07: 2 g via INTRAVENOUS
  Filled 2022-02-07: qty 50

## 2022-02-07 MED ORDER — ETOMIDATE 2 MG/ML IV SOLN
INTRAVENOUS | Status: DC | PRN
  Administered 2022-02-07: 20 mg via INTRAVENOUS

## 2022-02-07 MED ORDER — FENTANYL 2500MCG IN NS 250ML (10MCG/ML) PREMIX INFUSION
50.0000 ug/h | INTRAVENOUS | Status: DC
  Administered 2022-02-07: 100 ug/h via INTRAVENOUS
  Filled 2022-02-07: qty 250

## 2022-02-07 MED ORDER — PROPOFOL 1000 MG/100ML IV EMUL
25.0000 ug/kg/min | INTRAVENOUS | Status: DC
  Administered 2022-02-07: 80 ug/kg/min via INTRAVENOUS
  Administered 2022-02-08: 60 ug/kg/min via INTRAVENOUS
  Administered 2022-02-08: 50 ug/kg/min via INTRAVENOUS
  Administered 2022-02-08: 55 ug/kg/min via INTRAVENOUS
  Administered 2022-02-08: 60 ug/kg/min via INTRAVENOUS
  Administered 2022-02-09: 50 ug/kg/min via INTRAVENOUS
  Administered 2022-02-09: 80 ug/kg/min via INTRAVENOUS
  Administered 2022-02-09: 50 ug/kg/min via INTRAVENOUS
  Administered 2022-02-09: 80 ug/kg/min via INTRAVENOUS
  Administered 2022-02-09 (×2): 50 ug/kg/min via INTRAVENOUS
  Administered 2022-02-09 – 2022-02-11 (×10): 80 ug/kg/min via INTRAVENOUS
  Administered 2022-02-11: 75 ug/kg/min via INTRAVENOUS
  Administered 2022-02-11: 50 ug/kg/min via INTRAVENOUS
  Administered 2022-02-11: 80 ug/kg/min via INTRAVENOUS
  Administered 2022-02-11: 70 ug/kg/min via INTRAVENOUS
  Administered 2022-02-11 – 2022-02-12 (×2): 50 ug/kg/min via INTRAVENOUS
  Administered 2022-02-12 (×2): 75 ug/kg/min via INTRAVENOUS
  Administered 2022-02-12: 70 ug/kg/min via INTRAVENOUS
  Administered 2022-02-12: 50 ug/kg/min via INTRAVENOUS
  Administered 2022-02-12: 40 ug/kg/min via INTRAVENOUS
  Administered 2022-02-12: 75 ug/kg/min via INTRAVENOUS
  Administered 2022-02-13: 60 ug/kg/min via INTRAVENOUS
  Administered 2022-02-13: 30 ug/kg/min via INTRAVENOUS
  Administered 2022-02-13: 39.951 ug/kg/min via INTRAVENOUS
  Administered 2022-02-14: 30 ug/kg/min via INTRAVENOUS
  Administered 2022-02-14 (×2): 40 ug/kg/min via INTRAVENOUS
  Administered 2022-02-15 (×3): 35 ug/kg/min via INTRAVENOUS
  Administered 2022-02-15: 45 ug/kg/min via INTRAVENOUS
  Administered 2022-02-16: 25 ug/kg/min via INTRAVENOUS
  Administered 2022-02-16: 29.902 ug/kg/min via INTRAVENOUS
  Administered 2022-02-17 (×2): 30 ug/kg/min via INTRAVENOUS
  Filled 2022-02-07 (×12): qty 100
  Filled 2022-02-07: qty 200
  Filled 2022-02-07 (×4): qty 100
  Filled 2022-02-07: qty 200
  Filled 2022-02-07 (×14): qty 100
  Filled 2022-02-07: qty 200
  Filled 2022-02-07 (×9): qty 100
  Filled 2022-02-07: qty 200
  Filled 2022-02-07 (×3): qty 100

## 2022-02-07 MED ORDER — FAMOTIDINE 20 MG PO TABS: 20.0000 mg | ORAL_TABLET | Freq: Two times a day (BID) | ORAL | Status: DC

## 2022-02-07 MED ORDER — ARFORMOTEROL TARTRATE 15 MCG/2ML IN NEBU
15.0000 ug | INHALATION_SOLUTION | Freq: Two times a day (BID) | RESPIRATORY_TRACT | Status: DC
  Administered 2022-02-07: 15 ug via RESPIRATORY_TRACT
  Filled 2022-02-07: qty 2

## 2022-02-07 MED ORDER — KETAMINE HCL 50 MG/5ML IJ SOSY
PREFILLED_SYRINGE | INTRAMUSCULAR | Status: AC
  Filled 2022-02-07: qty 10

## 2022-02-07 MED ORDER — ALBUTEROL (5 MG/ML) CONTINUOUS INHALATION SOLN
10.0000 mg/h | INHALATION_SOLUTION | RESPIRATORY_TRACT | Status: DC
  Filled 2022-02-07: qty 20

## 2022-02-07 MED ORDER — NOREPINEPHRINE 4 MG/250ML-% IV SOLN
2.0000 ug/min | INTRAVENOUS | Status: DC
  Filled 2022-02-07: qty 250

## 2022-02-07 MED ORDER — STERILE WATER FOR INJECTION IV SOLN
INTRAVENOUS | Status: DC
  Filled 2022-02-07 (×2): qty 1000

## 2022-02-07 MED ORDER — ETOMIDATE 2 MG/ML IV SOLN
INTRAVENOUS | Status: AC
  Filled 2022-02-07: qty 20

## 2022-02-07 MED ORDER — METHYLPREDNISOLONE SODIUM SUCC 125 MG IJ SOLR
120.0000 mg | INTRAMUSCULAR | Status: DC
  Filled 2022-02-07: qty 2

## 2022-02-07 MED ORDER — ALBUTEROL (5 MG/ML) CONTINUOUS INHALATION SOLN
20.0000 mg/h | INHALATION_SOLUTION | RESPIRATORY_TRACT | Status: DC
  Administered 2022-02-07: 20 mg/h via RESPIRATORY_TRACT
  Filled 2022-02-07 (×2): qty 20

## 2022-02-07 MED ORDER — IPRATROPIUM-ALBUTEROL 0.5-2.5 (3) MG/3ML IN SOLN: 3.0000 mL | Freq: Once | RESPIRATORY_TRACT | Status: DC

## 2022-02-07 MED ORDER — SUCCINYLCHOLINE CHLORIDE 200 MG/10ML IV SOSY
PREFILLED_SYRINGE | INTRAVENOUS | Status: AC
  Filled 2022-02-07: qty 10

## 2022-02-07 MED ORDER — SODIUM BICARBONATE 8.4 % IV SOLN
100.0000 meq | Freq: Once | INTRAVENOUS | Status: AC
  Administered 2022-02-07: 100 meq via INTRAVENOUS
  Filled 2022-02-07: qty 50

## 2022-02-07 MED ORDER — ONDANSETRON HCL 4 MG/2ML IJ SOLN: 4.0000 mg | Freq: Four times a day (QID) | INTRAMUSCULAR | Status: DC | PRN

## 2022-02-07 MED ORDER — DOCUSATE SODIUM 50 MG/5ML PO LIQD: 100.0000 mg | Freq: Two times a day (BID) | ORAL | Status: DC | PRN

## 2022-02-07 MED ORDER — LORATADINE 10 MG PO TABS
10.0000 mg | ORAL_TABLET | Freq: Every day | ORAL | Status: DC
  Administered 2022-02-08 – 2022-02-20 (×11): 10 mg
  Filled 2022-02-07 (×11): qty 1

## 2022-02-07 MED ORDER — ALBUTEROL SULFATE (2.5 MG/3ML) 0.083% IN NEBU
INHALATION_SOLUTION | RESPIRATORY_TRACT | Status: AC
  Filled 2022-02-07: qty 12

## 2022-02-07 MED ORDER — ALBUTEROL SULFATE (2.5 MG/3ML) 0.083% IN NEBU
10.0000 mg | INHALATION_SOLUTION | Freq: Once | RESPIRATORY_TRACT | Status: AC
  Administered 2022-02-07: 10 mg via RESPIRATORY_TRACT
  Filled 2022-02-07: qty 12

## 2022-02-07 MED ORDER — PROPOFOL 1000 MG/100ML IV EMUL
0.0000 ug/kg/min | INTRAVENOUS | Status: DC
  Administered 2022-02-07: 20 ug/kg/min via INTRAVENOUS
  Filled 2022-02-07: qty 100

## 2022-02-07 MED ORDER — SODIUM BICARBONATE 8.4 % IV SOLN
INTRAVENOUS | Status: AC
  Filled 2022-02-07: qty 50

## 2022-02-07 MED ORDER — SODIUM CHLORIDE 0.9 % IV SOLN
0.0000 ug/kg/min | INTRAVENOUS | Status: DC
  Administered 2022-02-07: 3 ug/kg/min via INTRAVENOUS
  Administered 2022-02-08: 4 ug/kg/min via INTRAVENOUS
  Administered 2022-02-08: 3 ug/kg/min via INTRAVENOUS
  Filled 2022-02-07 (×4): qty 20

## 2022-02-07 MED ORDER — POTASSIUM CHLORIDE 20 MEQ PO PACK
40.0000 meq | PACK | Freq: Once | ORAL | Status: AC
  Administered 2022-02-07: 40 meq
  Filled 2022-02-07: qty 2

## 2022-02-07 MED ORDER — POLYETHYLENE GLYCOL 3350 17 G PO PACK
17.0000 g | PACK | Freq: Every day | ORAL | Status: DC
  Administered 2022-02-08 – 2022-02-13 (×6): 17 g
  Filled 2022-02-07 (×6): qty 1

## 2022-02-07 MED ORDER — CEFAZOLIN SODIUM-DEXTROSE 2-4 GM/100ML-% IV SOLN
INTRAVENOUS | Status: AC
  Filled 2022-02-07: qty 100

## 2022-02-07 MED ORDER — REVEFENACIN 175 MCG/3ML IN SOLN
175.0000 ug | Freq: Every day | RESPIRATORY_TRACT | Status: DC
  Administered 2022-02-07: 175 ug via RESPIRATORY_TRACT
  Filled 2022-02-07: qty 3

## 2022-02-07 MED ORDER — IPRATROPIUM-ALBUTEROL 0.5-2.5 (3) MG/3ML IN SOLN
3.0000 mL | Freq: Once | RESPIRATORY_TRACT | Status: AC
  Administered 2022-02-07: 3 mL via RESPIRATORY_TRACT
  Filled 2022-02-07: qty 3

## 2022-02-07 MED ORDER — FENTANYL CITRATE PF 50 MCG/ML IJ SOSY: 50.0000 ug | PREFILLED_SYRINGE | Freq: Once | INTRAMUSCULAR | Status: DC

## 2022-02-07 MED ORDER — ORAL CARE MOUTH RINSE
15.0000 mL | OROMUCOSAL | Status: DC
  Administered 2022-02-07 – 2022-02-17 (×116): 15 mL via OROMUCOSAL

## 2022-02-07 MED ORDER — DEXAMETHASONE SODIUM PHOSPHATE 10 MG/ML IJ SOLN
10.0000 mg | Freq: Once | INTRAMUSCULAR | Status: AC
  Administered 2022-02-07: 10 mg via INTRAMUSCULAR
  Filled 2022-02-07: qty 1

## 2022-02-07 MED ORDER — IPRATROPIUM-ALBUTEROL 0.5-2.5 (3) MG/3ML IN SOLN
3.0000 mL | RESPIRATORY_TRACT | Status: DC | PRN
  Filled 2022-02-07: qty 12

## 2022-02-07 MED ORDER — MIDAZOLAM HCL 2 MG/2ML IJ SOLN
INTRAMUSCULAR | Status: AC
  Filled 2022-02-07: qty 2

## 2022-02-07 MED ORDER — ARTIFICIAL TEARS OPHTHALMIC OINT
1.0000 | TOPICAL_OINTMENT | Freq: Three times a day (TID) | OPHTHALMIC | Status: DC
  Administered 2022-02-07 – 2022-02-10 (×8): 1 via OPHTHALMIC
  Filled 2022-02-07 (×4): qty 3.5

## 2022-02-07 MED ORDER — LACTATED RINGERS IV SOLN: INTRAVENOUS | Status: DC | PRN

## 2022-02-07 MED ORDER — FAMOTIDINE 20 MG PO TABS
20.0000 mg | ORAL_TABLET | Freq: Two times a day (BID) | ORAL | Status: AC
  Administered 2022-02-08 – 2022-02-09 (×3): 20 mg
  Filled 2022-02-07 (×4): qty 1

## 2022-02-07 MED ORDER — FENTANYL CITRATE PF 50 MCG/ML IJ SOSY
PREFILLED_SYRINGE | INTRAMUSCULAR | Status: AC
  Administered 2022-02-07: 50 ug via INTRAVENOUS
  Filled 2022-02-07: qty 2

## 2022-02-07 MED ORDER — SODIUM CHLORIDE 0.9% IV SOLUTION: Freq: Once | INTRAVENOUS | Status: DC

## 2022-02-07 MED ORDER — ROCURONIUM BROMIDE 50 MG/5ML IV SOLN
INTRAVENOUS | Status: DC | PRN
  Administered 2022-02-07: 80 mg via INTRAVENOUS

## 2022-02-07 MED ORDER — DOCUSATE SODIUM 50 MG/5ML PO LIQD
100.0000 mg | Freq: Two times a day (BID) | ORAL | Status: DC
  Administered 2022-02-08 – 2022-02-15 (×13): 100 mg
  Filled 2022-02-07 (×14): qty 10

## 2022-02-07 MED ORDER — FENTANYL CITRATE (PF) 100 MCG/2ML IJ SOLN
INTRAMUSCULAR | Status: DC | PRN
  Administered 2022-02-07: 100 ug via INTRAVENOUS

## 2022-02-07 MED ORDER — LORAZEPAM 2 MG/ML IJ SOLN
0.5000 mg | Freq: Once | INTRAMUSCULAR | Status: AC
  Administered 2022-02-07: 0.5 mg via INTRAVENOUS
  Filled 2022-02-07: qty 1

## 2022-02-07 MED ORDER — HYDRALAZINE HCL 20 MG/ML IJ SOLN: 10.0000 mg | Freq: Four times a day (QID) | INTRAMUSCULAR | Status: DC | PRN

## 2022-02-07 MED ORDER — HEPARIN SODIUM (PORCINE) 5000 UNIT/ML IJ SOLN
5000.0000 [IU] | Freq: Three times a day (TID) | INTRAMUSCULAR | Status: DC
  Administered 2022-02-07: 5000 [IU] via SUBCUTANEOUS
  Filled 2022-02-07: qty 1

## 2022-02-07 MED ORDER — IPRATROPIUM-ALBUTEROL 0.5-2.5 (3) MG/3ML IN SOLN
3.0000 mL | RESPIRATORY_TRACT | Status: DC
  Administered 2022-02-07 – 2022-02-08 (×3): 3 mL via RESPIRATORY_TRACT
  Filled 2022-02-07 (×3): qty 3

## 2022-02-07 MED ORDER — METHYLPREDNISOLONE SODIUM SUCC 125 MG IJ SOLR
80.0000 mg | INTRAMUSCULAR | Status: DC
  Administered 2022-02-08 – 2022-02-11 (×20): 80 mg via INTRAVENOUS
  Filled 2022-02-07 (×20): qty 2

## 2022-02-07 MED ORDER — CHLORHEXIDINE GLUCONATE CLOTH 2 % EX PADS
6.0000 | MEDICATED_PAD | Freq: Every day | CUTANEOUS | Status: DC
  Administered 2022-02-07 – 2022-02-20 (×15): 6 via TOPICAL

## 2022-02-07 MED ORDER — SODIUM CHLORIDE 0.9 % IV SOLN: 250.0000 mL | INTRAVENOUS | Status: DC

## 2022-02-07 MED ORDER — METHYLPREDNISOLONE SODIUM SUCC 125 MG IJ SOLR
125.0000 mg | Freq: Once | INTRAMUSCULAR | Status: AC
  Administered 2022-02-07: 125 mg via INTRAVENOUS
  Filled 2022-02-07: qty 2

## 2022-02-07 MED ORDER — ORAL CARE MOUTH RINSE: 15.0000 mL | OROMUCOSAL | Status: DC | PRN

## 2022-02-07 MED ORDER — FENTANYL BOLUS VIA INFUSION: 50.0000 ug | INTRAVENOUS | Status: DC | PRN

## 2022-02-07 MED ORDER — POTASSIUM CHLORIDE 20 MEQ PO PACK
40.0000 meq | PACK | Freq: Once | ORAL | Status: DC
  Filled 2022-02-07: qty 2

## 2022-02-07 MED ORDER — SODIUM CHLORIDE 0.9 % IV SOLN
250.0000 mL | INTRAVENOUS | Status: DC
  Administered 2022-02-15: 500 mL via INTRAVENOUS

## 2022-02-07 MED ORDER — POLYETHYLENE GLYCOL 3350 17 G PO PACK: 17.0000 g | PACK | Freq: Every day | ORAL | Status: DC | PRN

## 2022-02-07 MED ORDER — INSULIN ASPART 100 UNIT/ML IJ SOLN
0.0000 [IU] | INTRAMUSCULAR | Status: DC
  Administered 2022-02-08: 8 [IU] via SUBCUTANEOUS

## 2022-02-07 MED ORDER — ALBUMIN HUMAN 5 % IV SOLN
INTRAVENOUS | Status: AC
  Filled 2022-02-07: qty 1000

## 2022-02-07 MED ORDER — LIDOCAINE HCL (PF) 1 % IJ SOLN
INTRAMUSCULAR | Status: DC | PRN
  Administered 2022-02-07: 2 mL

## 2022-02-07 MED ORDER — PANTOPRAZOLE SODIUM 40 MG IV SOLR
40.0000 mg | Freq: Every day | INTRAVENOUS | Status: DC
  Filled 2022-02-07: qty 10

## 2022-02-07 SURGICAL SUPPLY — 12 items
CATH DUAL LUMEN CRESCENT 30FR (CATHETERS) IMPLANT
ELECT DEFIB PAD ADLT CADENCE (PAD) IMPLANT
GUIDEWIRE SAFE TJ AMPLATZ EXST (WIRE) IMPLANT
KIT MICROPUNCTURE NIT STIFF (SHEATH) IMPLANT
SET TUBING HLS ADVANCED 7.0 (TUBING) IMPLANT
SHEATH PINNACLE 6F 10CM (SHEATH) IMPLANT
SHEATH PINNACLE 8F 10CM (SHEATH) IMPLANT
SHEATH PROBE COVER 6X72 (BAG) IMPLANT
WIRE AMPLATZ SS-J .035X180CM (WIRE) IMPLANT
WIRE EMERALD 3MM-J .025X260CM (WIRE) IMPLANT
WIRE EMERALD 3MM-J .035X150CM (WIRE) IMPLANT
WIRE EMERALD 3MM-J .035X260CM (WIRE) IMPLANT

## 2022-02-07 NOTE — ED Notes (Signed)
MD at bedside. 

## 2022-02-07 NOTE — Progress Notes (Addendum)
Mother, Christen Bame, updated at bedside on patients status, plan of care. Reviewed nature of critical illness, potential for ECMO pending clinical course. Questions answered. Support offered.     Mother reports she does not smoke cigarettes, occasional THC.  Works in a [employer].  Has been sick for 3 days.  Lives with her mother currently.    Time spent with family: 16 minutes    Noe Gens, MSN, APRN, NP-C, AGACNP-BC [facility] 02/07/2022, 6:39 PM   Please see [email or web address] for pager details.   From 7A-7P if no response, please call [phone] After hours, please call ELink [phone]

## 2022-02-07 NOTE — Anesthesia Preprocedure Evaluation (Signed)
Anesthesia Evaluation  Patient identified by MRN, date of birth, ID band  Reviewed: Unable to perform ROS - Chart review onlyPreop documentation limited or incomplete due to emergent nature of procedure.  Airway Mallampati: Intubated       Dental   Pulmonary           Cardiovascular      Neuro/Psych    GI/Hepatic   Endo/Other    Renal/GU      Musculoskeletal   Abdominal   Peds  Hematology   Anesthesia Other Findings   Reproductive/Obstetrics                             Anesthesia Physical Anesthesia Plan  ASA: 5 and emergent  Anesthesia Plan: General   Post-op Pain Management:    Induction:   PONV Risk Score and Plan: 3 and Treatment may vary due to age or medical condition  Airway Management Planned: Oral ETT  Additional Equipment: Arterial line  Intra-op Plan:   Post-operative Plan: Post-operative intubation/ventilation  Informed Consent:      History available from chart only and Only emergency history available  Plan Discussed with: CRNA  Anesthesia Plan Comments:        Anesthesia Quick Evaluation

## 2022-02-07 NOTE — ED Notes (Signed)
BIPAP applied

## 2022-02-07 NOTE — Progress Notes (Signed)
Albuterol 20mg /hr continuous nebulizer started.

## 2022-02-07 NOTE — Progress Notes (Signed)
ABG    Component Value Date/Time   PHART 7.066 (LL) 02/07/2022 1904   PCO2ART 103.8 (HH) 02/07/2022 1904   PO2ART 474 (H) 02/07/2022 1904   HCO3 30.0 (H) 02/07/2022 1904   TCO2 33 (H) 02/07/2022 1904   ACIDBASEDEF 4.0 (H) 02/07/2022 1904   O2SAT 100 02/07/2022 1904   Giving 2 amps bicarb, starting infusion Increase RR to 16 Repeat ABG in 1 hr.  Julian Hy, DO 02/07/22 7:20 PM [facility]

## 2022-02-07 NOTE — ED Notes (Signed)
CCM remains at bedside.

## 2022-02-07 NOTE — ED Triage Notes (Signed)
Pt arrived POV from home c/o SHOB, tightness in the chest and coughing. Pt does have a hx of asthma and has been using her rescue inhalers all day with no relief. Pt has audible wheezing in triage.

## 2022-02-07 NOTE — ED Provider Notes (Signed)
[facility] Provider Note   CSN: [id number] Arrival date & time: 02/07/22  1410     History  Chief Complaint  Patient presents with   Shortness of Breath    Kaitlyn Mcneil is a 27 y.o. female.  27 year old female resents with shortness of breath.  Patient has history of asthma and this feels similar.  She was at home nebulizers without relief.  Patient states she has had positive sick exposures.  She has had fever and cough.  Also notes myalgias.  Patient has been hospitalized in the past for her asthma.  Denies any DVT risk factors.       Home Medications Prior to Admission medications   Not on File      Allergies    Shellfish allergy    Review of Systems   Review of Systems  All other systems reviewed and are negative.   Physical Exam Updated Vital Signs BP (!) 181/126 (BP Location: Right Arm)   Pulse (!) 126   Temp 99 F (37.2 C)   Resp (!) 24   Ht 1.524 m (5')   Wt 68 kg   LMP  (LMP Unknown)   SpO2 93%   BMI 29.29 kg/m  Physical Exam Vitals and nursing note reviewed.  Constitutional:      General: She is not in acute distress.    Appearance: Normal appearance. She is well-developed. She is not toxic-appearing.  HENT:     Head: Normocephalic and atraumatic.  Eyes:     General: Lids are normal.     Conjunctiva/sclera: Conjunctivae normal.     Pupils: Pupils are equal, round, and reactive to light.  Neck:     Thyroid: No thyroid mass.     Trachea: No tracheal deviation.  Cardiovascular:     Rate and Rhythm: Regular rhythm. Tachycardia present.     Heart sounds: Normal heart sounds. No murmur heard.    No gallop.  Pulmonary:     Effort: Tachypnea, prolonged expiration and respiratory distress present.     Breath sounds: No stridor. Decreased breath sounds and wheezing present. No rhonchi or rales.  Abdominal:     General: There is no distension.     Palpations: Abdomen is soft.     Tenderness:  There is no abdominal tenderness. There is no rebound.  Musculoskeletal:        General: No tenderness. Normal range of motion.     Cervical back: Normal range of motion and neck supple.  Skin:    General: Skin is warm and dry.     Findings: No abrasion or rash.  Neurological:     Mental Status: She is alert and oriented to person, place, and time. Mental status is at baseline.     GCS: GCS eye subscore is 4. GCS verbal subscore is 5. GCS motor subscore is 6.     Cranial Nerves: No cranial nerve deficit.     Sensory: No sensory deficit.     Motor: Motor function is intact.  Psychiatric:        Attention and Perception: Attention normal.        Speech: Speech normal.        Behavior: Behavior normal.     ED Results / Procedures / Treatments   Labs (all labs ordered are listed, but only abnormal results are displayed) Labs Reviewed  RESP PANEL BY RT-PCR (RSV, FLU A&B, COVID)  [id number]  BASIC METABOLIC PANEL  CBC WITH DIFFERENTIAL/PLATELET    EKG EKG Interpretation  Date/Time:  Monday February 07 2022 14:18:10 EST Ventricular Rate:  127 PR Interval:  144 QRS Duration: 74 QT Interval:  294 QTC Calculation: 427 R Axis:   92 Text Interpretation: Sinus tachycardia Right atrial enlargement Rightward axis Pulmonary disease pattern Nonspecific ST and T wave abnormality Abnormal ECG No previous ECGs available Confirmed by Lacretia Leigh ([id number]) on 02/07/2022 4:17:08 PM  Radiology DG Chest Portable 1 View  Result Date: 02/07/2022 CLINICAL DATA:  Shortness of breath. EXAM: PORTABLE CHEST 1 VIEW COMPARISON:  Chest x-ray dated 02/22/2017. FINDINGS: Heart size and mediastinal contours are within normal limits. Mild prominence of the central perihilar bronchovascular markings bilaterally. Lungs are otherwise clear. No pleural effusion or pneumothorax is seen. Osseous structures about the chest are unremarkable. IMPRESSION: Mild prominence of the central perihilar bronchovascular markings  bilaterally, compatible with viral bronchitis or reactive airway disease. No evidence of a consolidating pneumonia. Electronically Signed   By: Franki Cabot M.D.   On: 02/07/2022 16:08    Procedures Procedures    Medications Ordered in ED Medications  magnesium sulfate IVPB 2 g 50 mL (2 g Intravenous New Bag/Given 02/07/22 1612)  LORazepam (ATIVAN) injection 0.5 mg (has no administration in time range)  ipratropium-albuterol (DUONEB) 0.5-2.5 (3) MG/3ML nebulizer solution 3 mL (3 mLs Nebulization Given 02/07/22 1512)  dexamethasone (DECADRON) injection 10 mg (10 mg Intramuscular Given 02/07/22 1512)  albuterol (PROVENTIL) (2.5 MG/3ML) 0.083% nebulizer solution 10 mg (10 mg Nebulization Given 02/07/22 1609)  methylPREDNISolone sodium succinate (SOLU-MEDROL) 125 mg/2 mL injection 125 mg (125 mg Intravenous Given 02/07/22 1611)    ED Course/ Medical Decision Making/ A&P                             Medical Decision Making Amount and/or Complexity of Data Reviewed Labs: ordered.  Risk OTC drugs. Prescription drug management.  Patient's chest x-ray shows no acute infiltrates.  Patient is EKG per interpretation shows sinus tachycardia.  Viral panel negative. Patient presented and respiratory distress.  Was given and magnesium along with Solu-Medrol.  Was given a continuous albuterol treatment.  Patient very anxious at this time.  Did not do well with this and became more somnolent.  Pulse oximetry start to go down.  Was placed on BiPAP.  Patient did have improvement of her status but mental status began to decrease.  Decision made to intubate for airway protection.  Please see resident procedure note for this.  Discussed with critical care and they have accepted patient to go to the ICU  CRITICAL CARE Performed by: Leota Jacobsen Total critical care time: 50 minutes Critical care time was exclusive of separately billable procedures and treating other patients. Critical care was necessary to treat  or prevent imminent or life-threatening deterioration. Critical care was time spent personally by me on the following activities: development of treatment plan with patient and/or surrogate as well as nursing, discussions with consultants, evaluation of patient's response to treatment, examination of patient, obtaining history from patient or surrogate, ordering and performing treatments and interventions, ordering and review of laboratory studies, ordering and review of radiographic studies, pulse oximetry and re-evaluation of patient's condition.         Final Clinical Impression(s) / ED Diagnoses Final diagnoses:  None    Rx / DC Orders ED Discharge Orders     None         Lacretia Leigh,  MD 02/07/22 1658

## 2022-02-07 NOTE — Progress Notes (Addendum)
Repeat ABG 7.1/pc02 101  Adding duonebs, another CAT now.  Minimal autoPEEP per RT- increase RR to 18 Bedside team will check on her in a few minutes. Additional K+ tonight with bicarb and albuterol.  Julian Hy, DO 02/07/22 9:36 PM [facility]

## 2022-02-07 NOTE — ED Notes (Signed)
RT called for BIPAP

## 2022-02-07 NOTE — H&P (View-Only) (Signed)
Advanced Heart Failure Team Consult Note   Primary Physician: Patient, No Pcp Per PCP-Cardiologist:  None  Reason for Consultation: Acute hypoxic/hypercarbic respiratory failure  HPI:    Kaitlyn Mcneil is seen today for evaluation of possible VV ECMO cannulation for respiratory failure at the request of Dr. Chase Caller.   Kaitlyn Mcneil is a 27 y/o woman with a history of eczema, asthma, and allergies who presented with an acute asthma exacerbation. Over several hours today she was noted to have increasing oxygen requirements and progressive respiratory failure. She was intubated in the ED for respiratory distress and PCCM was consulted. Due to poor ventilation ECMO team was consulted.  Per the ED notes, she has had a cough and fever PTA and known sick contacts. She is on PRN albuterol only, not maintenance long acting inhalers. She has been hospitalized previously for asthma. She was seen in the ED at [facility] in 12/23 for AE asthma and was prescribed steroids.   During the course of the evening has remained severely hypercarbic despite maximum intervention. Recently became hypotensive with sedation and NE started but now off.   7.06/104/474/100% -> 7.12/99/124/97%   Lactic acid 5.5   pCXR: no infiltrate  ROS: Not available    Home Medications Prior to Admission medications   Medication Sig Start Date End Date Taking? Authorizing Provider  albuterol (VENTOLIN HFA) 108 (90 Base) MCG/ACT inhaler Inhale 1-2 puffs into the lungs every 6 (six) hours as needed for wheezing or shortness of breath.   Yes [provider]  ALBUTEROL IN Inhale 1 vial into the lungs every 6 (six) hours as needed (wheezing, shortness of breath).   Yes [provider]  triamcinolone ointment (KENALOG) 0.1 % Apply 1 Application topically daily as needed (eczema flares).   Yes [provider]    Past Medical History: Past Medical History:  Diagnosis Date   Asthma     Past  Surgical History: History reviewed. No pertinent surgical history.  Family History: History reviewed. No pertinent family history.  Social History: Social History   Socioeconomic History   Marital status: Single    Spouse name: Not on file   Number of children: Not on file   Years of education: Not on file   Highest education level: Not on file  Occupational History   Not on file  Tobacco Use   Smoking status: Never   Smokeless tobacco: Never  Substance and Sexual Activity   Alcohol use: Not on file   Drug use: Not on file   Sexual activity: Not on file  Other Topics Concern   Not on file  Social History Narrative   Not on file   Social Determinants of Health   Financial Resource Strain: Not on file  Food Insecurity: Not on file  Transportation Needs: Not on file  Physical Activity: Not on file  Stress: Not on file  Social Connections: Not on file    Allergies:  Allergies  Allergen Reactions   Shellfish Allergy Swelling    Throat, facial swelling   Tomato Itching and Other (See Comments)    Eczema flares    Objective:    Vital Signs:   Temp:  [96.4 F (35.8 C)-99 F (37.2 C)] 97.7 F (36.5 C) (02/05 1943) Pulse Rate:  [109-137] 137 (02/05 1800) Resp:  [11-39] 16 (02/05 1736) BP: (147-254)/(85-190) 170/106 (02/05 1800) SpO2:  [78 %-99 %] 94 % (02/05 2211) FiO2 (%):  [50 %-100 %] 70 % (02/05 2211) Weight:  [  68 kg-71.1 kg] 71.1 kg (02/05 1800) Last BM Date :  (pta)  Weight change: Filed Weights   02/07/22 1434 02/07/22 1800  Weight: 68 kg 71.1 kg    Intake/Output:   Intake/Output Summary (Last 24 hours) at 02/07/2022 2249 Last data filed at 02/07/2022 1809 Gross per 24 hour  Intake 50 ml  Output 75 ml  Net -25 ml      Physical Exam    General:  Intubated sedated HEENT: normal + ETT Neck: supple. JVP  up . Carotids 2+ bilat; no bruits. No lymphadenopathy or thyromegaly appreciated. Cor: PMI nondisplaced. Regular rate & rhythm. No rubs,  gallops or murmurs. Lungs: poor air movement Abdomen: soft, nontender, nondistended. No hepatosplenomegaly. No bruits or masses. Good bowel sounds. Extremities: no cyanosis, clubbing, rash, edema Neuro: intubated sedated   Telemetry   Sinus tach 120-130s Personally reviewed  Labs   Basic Metabolic Panel: Recent Labs  Lab 02/07/22 1516 02/07/22 1904 02/07/22 2130  NA 135 137 139  K 3.1* 3.5 4.1  CL 101  --   --   CO2 22  --   --   GLUCOSE 96  --   --   BUN 6  --   --   CREATININE 0.67  --   --   CALCIUM 9.2  --   --     Liver Function Tests: No results for input(s): "AST", "ALT", "ALKPHOS", "BILITOT", "PROT", "ALBUMIN" in the last 168 hours. No results for input(s): "LIPASE", "AMYLASE" in the last 168 hours. No results for input(s): "AMMONIA" in the last 168 hours.  CBC: Recent Labs  Lab 02/07/22 1516 02/07/22 1904 02/07/22 2130  WBC 16.7*  --   --   NEUTROABS 13.9*  --   --   HGB 12.7 14.6 12.9  HCT 38.7 43.0 38.0  MCV 83.0  --   --   PLT 283  --   --     Cardiac Enzymes: No results for input(s): "CKTOTAL", "CKMB", "CKMBINDEX", "TROPONINI" in the last 168 hours.  BNP: BNP (last 3 results) No results for input(s): "BNP" in the last 8760 hours.  ProBNP (last 3 results) No results for input(s): "PROBNP" in the last 8760 hours.   CBG: Recent Labs  Lab 02/07/22 1831 02/07/22 1940  GLUCAP 182* 179*    Coagulation Studies: No results for input(s): "LABPROT", "INR" in the last 72 hours.   Imaging   DG CHEST PORT 1 VIEW  Result Date: 02/07/2022 CLINICAL DATA:  [id number] Acute hypoxemic respiratory failure ([place]) [id number] EXAM: PORTABLE CHEST 1 VIEW COMPARISON:  02/07/2022 FINDINGS: Endotracheal tube is 3 cm above the carina. NG tube is in the stomach. Heart is normal size. Right basilar and left perihilar airspace disease, similar to prior study. No visible effusions or acute bony abnormality. IMPRESSION: Right basilar and left perihilar airspace  opacities, stable. Support devices in expected position. Electronically Signed   By: Rolm Baptise M.D.   On: 02/07/2022 22:29   DG Abd Portable 1V  Result Date: 02/07/2022 CLINICAL DATA:  Nasogastric tube placement. EXAM: PORTABLE ABDOMEN - 1 VIEW COMPARISON:  None Available. FINDINGS: Nasal/orogastric tube passes well below the diaphragm to curl within the mid stomach, tip projecting in the fundus. Hazy opacity noted at the right lung base, better visualized on the current portable chest radiograph. This may reflect atelectasis or infection. IMPRESSION: 1. Well-positioned nasal/orogastric tube. 2. Hazy right lung base opacity. Consider pneumonia in the proper clinical setting. Electronically Signed   By: Shanon Brow  Ormond M.D.   On: 02/07/2022 17:16   DG Chest Port 1 View  Result Date: 02/07/2022 CLINICAL DATA:  Endotracheal tube placement. Also reported central line placement. EXAM: PORTABLE CHEST 1 VIEW COMPARISON:  02/07/2022 at 3:52 p.m. FINDINGS: Endotracheal tube tip projects 1.8 cm above the carina. No visualized central line. Heart, mediastinum and hila are unremarkable. Clear lungs. IMPRESSION: 1. Well-positioned endotracheal tube. Electronically Signed   By: Lajean Manes M.D.   On: 02/07/2022 17:15   DG Chest Portable 1 View  Result Date: 02/07/2022 CLINICAL DATA:  Shortness of breath. EXAM: PORTABLE CHEST 1 VIEW COMPARISON:  Chest x-ray dated 02/22/2017. FINDINGS: Heart size and mediastinal contours are within normal limits. Mild prominence of the central perihilar bronchovascular markings bilaterally. Lungs are otherwise clear. No pleural effusion or pneumothorax is seen. Osseous structures about the chest are unremarkable. IMPRESSION: Mild prominence of the central perihilar bronchovascular markings bilaterally, compatible with viral bronchitis or reactive airway disease. No evidence of a consolidating pneumonia. Electronically Signed   By: Franki Cabot M.D.   On: 02/07/2022 16:08      Medications:     Current Medications:  artificial tears  1 Application Both Eyes N2D   Chlorhexidine Gluconate Cloth  6 each Topical Daily   docusate  100 mg Per Tube BID   famotidine  20 mg Per Tube BID   heparin  5,000 Units Subcutaneous Q8H   [START ON 02/08/2022] insulin aspart  0-15 Units Subcutaneous Q4H   [START ON 02/08/2022] ipratropium-albuterol  3 mL Nebulization Q4H   [START ON 02/08/2022] loratadine  10 mg Per Tube Daily   methylPREDNISolone (SOLU-MEDROL) injection  80 mg Intravenous Q4H   montelukast  10 mg Per Tube QHS   mouth rinse  15 mL Mouth Rinse Q2H   pantoprazole (PROTONIX) IV  40 mg Intravenous QHS   polyethylene glycol  17 g Per Tube Daily   [START ON 02/08/2022] potassium chloride  40 mEq Per Tube Once   sodium bicarbonate        Infusions:  sodium chloride     sodium chloride     sodium chloride     albuterol 20 mg/hr (02/07/22 2210)   cisatracurium (NIMBEX) 200 mg in sodium chloride 0.9 % 100 mL (2 mg/mL) infusion 3 mcg/kg/min (02/07/22 1851)   clevidipine     fentaNYL infusion INTRAVENOUS 200 mcg/hr (02/07/22 1823)   magnesium sulfate bolus IVPB     norepinephrine (LEVOPHED) Adult infusion 2 mcg/min (02/07/22 2214)   norepinephrine (LEVOPHED) Adult infusion     propofol (DIPRIVAN) infusion 80 mcg/kg/min (02/07/22 1930)   sodium bicarbonate 150 mEq in sterile water 1,150 mL infusion 100 mL/hr at 02/07/22 2036       Assessment/Plan   1. Acute hypoxic/hypercarbic respiratory failure in setting of status asthmaticus - d/w P/CCM including Drs. Clark and Clinton. She remains hypercarbic despite maximum intervention. Now with recent onset hemodynamic instability requiring NE.  - will repeat ABG, if not improving will plan VV ECMO cannulation  - plan bedside echo to assess RV  RESP Score + 11 = 92% survival  Echo LV hyperdynamic and underfilled EF 70-75%. RV normal.   CRITICAL CARE Performed by: Glori Bickers  Total critical care time: 55  minutes  Critical care time was exclusive of separately billable procedures and treating other patients.  Critical care was necessary to treat or prevent imminent or life-threatening deterioration.  Critical care was time spent personally by me (independent of midlevel providers or residents) on the  following activities: development of treatment plan with patient and/or surrogate as well as nursing, discussions with consultants, evaluation of patient's response to treatment, examination of patient, obtaining history from patient or surrogate, ordering and performing treatments and interventions, ordering and review of laboratory studies, ordering and review of radiographic studies, pulse oximetry and re-evaluation of patient's condition.   Length of Stay: 0  Arvilla Meres, MD  02/07/2022, 10:49 PM  Advanced Heart Failure Team Pager [phone] (M-F; 7a - 5p)  Please contact [facility] for night-coverage after hours (4p -7a ) and weekends on [email or web address]

## 2022-02-07 NOTE — Progress Notes (Signed)
[place] Progress Note Patient Name: Gwynneth Fabio DOB: Apr 20, 1995 MRN: [id number]   Date of Service  02/07/2022  HPI/Events of Note  Issues with IV access now and hypotension. Has deep sedation planned, with paralytics as well as a bicarb drip. D/w Dr Chase Caller . They will place central line. Art line has already been placed. ECMO consult also placed and is following.   eICU Interventions  Bedside evaluation requested. I placed order for Levophed to start via peripheral line as well.      Intervention Category Major Interventions: Respiratory failure - evaluation and management  Margaretmary Lombard 02/07/2022, 9:58 PM

## 2022-02-07 NOTE — Code Documentation (Signed)
Time out completed at bedside by Nicole Kindred, MD

## 2022-02-07 NOTE — Progress Notes (Addendum)
    PM rounds and called to bedsde  Patient Kaitlyn Mcneil now hemodynmically unstable - > despite dc diprivan m, increased bic gtt from 100cc/h t0 125/cch and fluid bolus  ABG Recent Labs  Lab 02/07/22 1904 02/07/22 2130  PHART 7.066* 7.117*  PCO2ART 103.8* 98.9*  PO2ART 474* 124*  HCO3 30.0* 32.2*  TCO2 33* 35*  O2SAT 100 97    ABG only marginally better Exam with diffuse bilaterally wheezing Pateint deeply sedated and paralyzed. No high Pip.Marland Kitchen 80% fio2, peep 5, RR 18   A Shock Severa ASThma - status with BP/HR insteability Severe refractory acidosis with lactic acidosis  Plan d/w Dr Carlis Abbott, Bensimohn and elink  - mag sulfate stat  - start levophed gtt via PIV - CVL - start [place] =- called Dr Haroldine Laws - chagne steroid dosing to 80mg  Q4h - ssi - nimbex (dc diprivan till bp/HR better, continue fent gtt, BIS monitoring - await RVP - check RAST allergen panel and IgE   Mom updated  45 min criticl care     SIGNATURE    Dr. Brand Males, M.D., F.C.C.P,  Pulmonary and Critical Care Medicine Staff Physician, [place] Director - Interstitial Lung Disease  Program  Medical Director - [facility] [place] [facility] [place], [place], [address]   Pager: [phone], If no answer  -> Check AMION or Try [phone] Telephone (clinical office): [phone] Telephone (research): [phone]  10:35 PM 02/07/2022

## 2022-02-07 NOTE — Consult Note (Signed)
Advanced Heart Failure Team Consult Note   Primary Physician: Patient, No Pcp Per PCP-Cardiologist:  None  Reason for Consultation: Acute hypoxic/hypercarbic respiratory failure  HPI:    Kaitlyn Mcneil is seen today for evaluation of possible VV ECMO cannulation for respiratory failure at the request of Dr. Chase Caller.   HMs. Kaitlyn Mcneil is a 27 y/o woman with a history of eczema, asthma, and allergies who presented with an acute asthma exacerbation. Over several hours today she was noted to have increasing oxygen requirements and progressive respiratory failure. She was intubated in the ED for respiratory distress and PCCM was consulted. Due to poor ventilation ECMO team was consulted.  Per the ED notes, she has had a cough and fever PTA and known sick contacts. She is on PRN albuterol only, not maintenance long acting inhalers. She has been hospitalized previously for asthma. She was seen in the ED at [facility] in 12/23 for AE asthma and was prescribed steroids.   During the course of the evening has remained severely hypercarbic despite maximum intervention. Recently became hypotensive with sedation and NE started but now off.   7.06/104/474/100% -> 7.12/99/124/97%   Lactic acid 5.5   pCXR: no infiltrate  ROS: Not available    Home Medications Prior to Admission medications   Medication Sig Start Date End Date Taking? Authorizing Provider  albuterol (VENTOLIN HFA) 108 (90 Base) MCG/ACT inhaler Inhale 1-2 puffs into the lungs every 6 (six) hours as needed for wheezing or shortness of breath.   Yes [provider]  ALBUTEROL IN Inhale 1 vial into the lungs every 6 (six) hours as needed (wheezing, shortness of breath).   Yes [provider]  triamcinolone ointment (KENALOG) 0.1 % Apply 1 Application topically daily as needed (eczema flares).   Yes [provider]    Past Medical History: Past Medical History:  Diagnosis Date   Asthma     Past  Surgical History: History reviewed. No pertinent surgical history.  Family History: History reviewed. No pertinent family history.  Social History: Social History   Socioeconomic History   Marital status: Single    Spouse name: Not on file   Number of children: Not on file   Years of education: Not on file   Highest education level: Not on file  Occupational History   Not on file  Tobacco Use   Smoking status: Never   Smokeless tobacco: Never  Substance and Sexual Activity   Alcohol use: Not on file   Drug use: Not on file   Sexual activity: Not on file  Other Topics Concern   Not on file  Social History Narrative   Not on file   Social Determinants of Health   Financial Resource Strain: Not on file  Food Insecurity: Not on file  Transportation Needs: Not on file  Physical Activity: Not on file  Stress: Not on file  Social Connections: Not on file    Allergies:  Allergies  Allergen Reactions   Shellfish Allergy Swelling    Throat, facial swelling   Tomato Itching and Other (See Comments)    Eczema flares    Objective:    Vital Signs:   Temp:  [96.4 F (35.8 C)-99 F (37.2 C)] 97.7 F (36.5 C) (02/05 1943) Pulse Rate:  [109-137] 137 (02/05 1800) Resp:  [11-39] 16 (02/05 1736) BP: (147-254)/(85-190) 170/106 (02/05 1800) SpO2:  [78 %-99 %] 94 % (02/05 2211) FiO2 (%):  [50 %-100 %] 70 % (02/05 2211) Weight:  [  68 kg-71.1 kg] 71.1 kg (02/05 1800) Last BM Date :  (pta)  Weight change: Filed Weights   02/07/22 1434 02/07/22 1800  Weight: 68 kg 71.1 kg    Intake/Output:   Intake/Output Summary (Last 24 hours) at 02/07/2022 2249 Last data filed at 02/07/2022 1809 Gross per 24 hour  Intake 50 ml  Output 75 ml  Net -25 ml      Physical Exam    General:  Intubated sedated HEENT: normal + ETT Neck: supple. JVP  up . Carotids 2+ bilat; no bruits. No lymphadenopathy or thyromegaly appreciated. Cor: PMI nondisplaced. Regular rate & rhythm. No rubs,  gallops or murmurs. Lungs: poor air movement Abdomen: soft, nontender, nondistended. No hepatosplenomegaly. No bruits or masses. Good bowel sounds. Extremities: no cyanosis, clubbing, rash, edema Neuro: intubated sedated   Telemetry   Sinus tach 120-130s Personally reviewed  Labs   Basic Metabolic Panel: Recent Labs  Lab 02/07/22 1516 02/07/22 1904 02/07/22 2130  NA 135 137 139  K 3.1* 3.5 4.1  CL 101  --   --   CO2 22  --   --   GLUCOSE 96  --   --   BUN 6  --   --   CREATININE 0.67  --   --   CALCIUM 9.2  --   --     Liver Function Tests: No results for input(s): "AST", "ALT", "ALKPHOS", "BILITOT", "PROT", "ALBUMIN" in the last 168 hours. No results for input(s): "LIPASE", "AMYLASE" in the last 168 hours. No results for input(s): "AMMONIA" in the last 168 hours.  CBC: Recent Labs  Lab 02/07/22 1516 02/07/22 1904 02/07/22 2130  WBC 16.7*  --   --   NEUTROABS 13.9*  --   --   HGB 12.7 14.6 12.9  HCT 38.7 43.0 38.0  MCV 83.0  --   --   PLT 283  --   --     Cardiac Enzymes: No results for input(s): "CKTOTAL", "CKMB", "CKMBINDEX", "TROPONINI" in the last 168 hours.  BNP: BNP (last 3 results) No results for input(s): "BNP" in the last 8760 hours.  ProBNP (last 3 results) No results for input(s): "PROBNP" in the last 8760 hours.   CBG: Recent Labs  Lab 02/07/22 1831 02/07/22 1940  GLUCAP 182* 179*    Coagulation Studies: No results for input(s): "LABPROT", "INR" in the last 72 hours.   Imaging   DG CHEST PORT 1 VIEW  Result Date: 02/07/2022 CLINICAL DATA:  [id number] Acute hypoxemic respiratory failure ([place]) [id number] EXAM: PORTABLE CHEST 1 VIEW COMPARISON:  02/07/2022 FINDINGS: Endotracheal tube is 3 cm above the carina. NG tube is in the stomach. Heart is normal size. Right basilar and left perihilar airspace disease, similar to prior study. No visible effusions or acute bony abnormality. IMPRESSION: Right basilar and left perihilar airspace  opacities, stable. Support devices in expected position. Electronically Signed   By: Rolm Baptise M.D.   On: 02/07/2022 22:29   DG Abd Portable 1V  Result Date: 02/07/2022 CLINICAL DATA:  Nasogastric tube placement. EXAM: PORTABLE ABDOMEN - 1 VIEW COMPARISON:  None Available. FINDINGS: Nasal/orogastric tube passes well below the diaphragm to curl within the mid stomach, tip projecting in the fundus. Hazy opacity noted at the right lung base, better visualized on the current portable chest radiograph. This may reflect atelectasis or infection. IMPRESSION: 1. Well-positioned nasal/orogastric tube. 2. Hazy right lung base opacity. Consider pneumonia in the proper clinical setting. Electronically Signed   By: Shanon Brow  Ormond M.D.   On: 02/07/2022 17:16   DG Chest Port 1 View  Result Date: 02/07/2022 CLINICAL DATA:  Endotracheal tube placement. Also reported central line placement. EXAM: PORTABLE CHEST 1 VIEW COMPARISON:  02/07/2022 at 3:52 p.m. FINDINGS: Endotracheal tube tip projects 1.8 cm above the carina. No visualized central line. Heart, mediastinum and hila are unremarkable. Clear lungs. IMPRESSION: 1. Well-positioned endotracheal tube. Electronically Signed   By: Lajean Manes M.D.   On: 02/07/2022 17:15   DG Chest Portable 1 View  Result Date: 02/07/2022 CLINICAL DATA:  Shortness of breath. EXAM: PORTABLE CHEST 1 VIEW COMPARISON:  Chest x-ray dated 02/22/2017. FINDINGS: Heart size and mediastinal contours are within normal limits. Mild prominence of the central perihilar bronchovascular markings bilaterally. Lungs are otherwise clear. No pleural effusion or pneumothorax is seen. Osseous structures about the chest are unremarkable. IMPRESSION: Mild prominence of the central perihilar bronchovascular markings bilaterally, compatible with viral bronchitis or reactive airway disease. No evidence of a consolidating pneumonia. Electronically Signed   By: Franki Cabot M.D.   On: 02/07/2022 16:08      Medications:     Current Medications:  artificial tears  1 Application Both Eyes N2D   Chlorhexidine Gluconate Cloth  6 each Topical Daily   docusate  100 mg Per Tube BID   famotidine  20 mg Per Tube BID   heparin  5,000 Units Subcutaneous Q8H   [START ON 02/08/2022] insulin aspart  0-15 Units Subcutaneous Q4H   [START ON 02/08/2022] ipratropium-albuterol  3 mL Nebulization Q4H   [START ON 02/08/2022] loratadine  10 mg Per Tube Daily   methylPREDNISolone (SOLU-MEDROL) injection  80 mg Intravenous Q4H   montelukast  10 mg Per Tube QHS   mouth rinse  15 mL Mouth Rinse Q2H   pantoprazole (PROTONIX) IV  40 mg Intravenous QHS   polyethylene glycol  17 g Per Tube Daily   [START ON 02/08/2022] potassium chloride  40 mEq Per Tube Once   sodium bicarbonate        Infusions:  sodium chloride     sodium chloride     sodium chloride     albuterol 20 mg/hr (02/07/22 2210)   cisatracurium (NIMBEX) 200 mg in sodium chloride 0.9 % 100 mL (2 mg/mL) infusion 3 mcg/kg/min (02/07/22 1851)   clevidipine     fentaNYL infusion INTRAVENOUS 200 mcg/hr (02/07/22 1823)   magnesium sulfate bolus IVPB     norepinephrine (LEVOPHED) Adult infusion 2 mcg/min (02/07/22 2214)   norepinephrine (LEVOPHED) Adult infusion     propofol (DIPRIVAN) infusion 80 mcg/kg/min (02/07/22 1930)   sodium bicarbonate 150 mEq in sterile water 1,150 mL infusion 100 mL/hr at 02/07/22 2036       Assessment/Plan   1. Acute hypoxic/hypercarbic respiratory failure in setting of status asthmaticus - d/w P/CCM including Drs. Clark and Clinton. She remains hypercarbic despite maximum intervention. Now with recent onset hemodynamic instability requiring NE.  - will repeat ABG, if not improving will plan VV ECMO cannulation  - plan bedside echo to assess RV  RESP Score + 11 = 92% survival  Echo LV hyperdynamic and underfilled EF 70-75%. RV normal.   CRITICAL CARE Performed by: Glori Bickers  Total critical care time: 55  minutes  Critical care time was exclusive of separately billable procedures and treating other patients.  Critical care was necessary to treat or prevent imminent or life-threatening deterioration.  Critical care was time spent personally by me (independent of midlevel providers or residents) on the  following activities: development of treatment plan with patient and/or surrogate as well as nursing, discussions with consultants, evaluation of patient's response to treatment, examination of patient, obtaining history from patient or surrogate, ordering and performing treatments and interventions, ordering and review of laboratory studies, ordering and review of radiographic studies, pulse oximetry and re-evaluation of patient's condition.   Length of Stay: 0  Shadasia Oldfield, MD  02/07/2022, 10:49 PM  Advanced Heart Failure Team Pager [phone] (M-F; 7a - 5p)  Please contact [facility] for night-coverage after hours (4p -7a ) and weekends on [email or web address]  

## 2022-02-07 NOTE — Progress Notes (Signed)
IVT consult placed r/t vasopressor order.  Upon arrival, RN states MD coming to place CL.  MD at bedside prior to leaving unit. Advised to place a new consult should need arise for u/s guided PIV. RN in agreement.

## 2022-02-07 NOTE — Consult Note (Signed)
ECMO Consult Note   Called to acute asthma exacerbation for ECMO Consult at (time)5:03pm by Rise Paganini MD Admitting Diagnosis- acute asthma exacerbation Primary Issue- acute hypoxic and hypercapnic respiratory failure Age:27 y.o. Weight: 71.1 kg  Days on Mechanical Ventilation- <1  MAP FiO2 Oxygen Index P/F Ratio         Vasopressors no   MSOF No   RESP score (VV-ECMO) : http://www.respscore.com  SAVE score (VA-ECMO) : http://www.save-score.com  Recent Blood Gas:     Component Value Date/Time   PHART 7.066 (LL) 02/07/2022 1904   PCO2ART 103.8 (HH) 02/07/2022 1904   PO2ART 474 (H) 02/07/2022 1904   HCO3 30.0 (H) 02/07/2022 1904   TCO2 33 (H) 02/07/2022 1904   ACIDBASEDEF 4.0 (H) 02/07/2022 1904   O2SAT 100 02/07/2022 1904    Coags: No results found for: "PTT", "INR", "FIBRINOGEN"  CBC    Component Value Date/Time   WBC 16.7 (H) 02/07/2022 1516   RBC 4.66 02/07/2022 1516   HGB 14.6 02/07/2022 1904   HCT 43.0 02/07/2022 1904   PLT 283 02/07/2022 1516   MCV 83.0 02/07/2022 1516   MCH 27.3 02/07/2022 1516   MCHC 32.8 02/07/2022 1516   RDW 16.0 (H) 02/07/2022 1516   LYMPHSABS 1.4 02/07/2022 1516   MONOABS 1.1 (H) 02/07/2022 1516   EOSABS 0.3 02/07/2022 1516   BASOSABS 0.0 02/07/2022 1516    BMET    Component Value Date/Time   NA 137 02/07/2022 1904   K 3.5 02/07/2022 1904   CL 101 02/07/2022 1516   CO2 22 02/07/2022 1516   GLUCOSE 96 02/07/2022 1516   BUN 6 02/07/2022 1516   CREATININE 0.67 02/07/2022 1516   CALCIUM 9.2 02/07/2022 1516   GFRNONAA >60 02/07/2022 1516                                                                                                                                                             ECMO physician Mennie Spiller, [place] notified at 5:10pm (time) Candidate meets ECMO Criteria- Yes  Placed on ECMO watch at 6pm (time).  Additional notes- Poorly controlled asthma, recent hospitalization for acute asthma exacerbation,  smoker Reason for Consult:acute respiratory failure Referring Physician: Delbert Mcneil Kaitlyn Mcneil is an 27 y.o. female.  HPI: Kaitlyn Mcneil is a 27 y/o woman with a history of eczema, asthma, and allergies who presented with an acute asthma exacerbation. Over several hours today she was noted to have increasing oxygen requirements and progressive respiratory failure. She was intubated in the ED for respiratory distress and PCCM was consulted. Due to poor ventilation ECMO team was consulted.  Per the ED notes, she has had a cough and fever PTA and known sick contacts. She is on PRN albuterol only, not maintenance long acting inhalers. She has been  hospitalized previously for asthma. She was seen in the ED at [facility] in 12/23 for AE asthma and was prescribed steroids.   Past Medical History:  Diagnosis Date   Asthma     History reviewed. No pertinent surgical history.  History reviewed. No pertinent family history.  Social History:  reports that she has never smoked. She has never used smokeless tobacco. No history on file for alcohol use and drug use.  Allergies:  Allergies  Allergen Reactions   Shellfish Allergy Swelling    Throat, facial swelling   Tomato Itching and Other (See Comments)    Eczema flares    Medications: I have reviewed the patient's current medications. Prior to Admission:  Medications Prior to Admission  Medication Sig Dispense Refill Last Dose   albuterol (VENTOLIN HFA) 108 (90 Base) MCG/ACT inhaler Inhale 1-2 puffs into the lungs every 6 (six) hours as needed for wheezing or shortness of breath.   02/07/2022   ALBUTEROL IN Inhale 1 vial into the lungs every 6 (six) hours as needed (wheezing, shortness of breath).   UNK   triamcinolone ointment (KENALOG) 0.1 % Apply 1 Application topically daily as needed (eczema flares).   UNK    Results for orders placed or performed during the hospital encounter of 02/07/22 (from the past 48 hour(s))  Resp panel by RT-PCR (RSV,  Flu A&B, Covid) Anterior Nasal Swab     Status: None   Collection Time: 02/07/22  3:13 PM   Specimen: Anterior Nasal Swab  Result Value Ref Range   SARS Coronavirus 2 by RT PCR NEGATIVE NEGATIVE   Influenza A by PCR NEGATIVE NEGATIVE   Influenza B by PCR NEGATIVE NEGATIVE    Comment: (NOTE) The Xpert Xpress SARS-CoV-2/FLU/RSV plus assay is intended as an aid in the diagnosis of influenza from Nasopharyngeal swab specimens and should not be used as a sole basis for treatment. Nasal washings and aspirates are unacceptable for Xpert Xpress SARS-CoV-2/FLU/RSV testing.  Fact Sheet for Patients: EntrepreneurPulse.com.au  Fact Sheet for Healthcare Providers: IncredibleEmployment.be  This test is not yet approved or cleared by the [place] FDA and has been authorized for detection and/or diagnosis of SARS-CoV-2 by FDA under an Emergency Use Authorization (EUA). This EUA will remain in effect (meaning this test can be used) for the duration of the COVID-19 declaration under Section 564(b)(1) of the Act, 21 U.S.C. section 360bbb-3(b)(1), unless the authorization is terminated or revoked.     Resp Syncytial Virus by PCR NEGATIVE NEGATIVE    Comment: (NOTE) Fact Sheet for Patients: EntrepreneurPulse.com.au  Fact Sheet for Healthcare Providers: IncredibleEmployment.be  This test is not yet approved or cleared by the [place] FDA and has been authorized for detection and/or diagnosis of SARS-CoV-2 by FDA under an Emergency Use Authorization (EUA). This EUA will remain in effect (meaning this test can be used) for the duration of the COVID-19 declaration under Section 564(b)(1) of the Act, 21 U.S.C. section 360bbb-3(b)(1), unless the authorization is terminated or revoked.  Performed at [facility], [place] [address], [place], [place] [address]   Urinalysis, Routine w reflex microscopic -Urine, Clean  Catch     Status: Abnormal   Collection Time: 02/07/22  3:13 PM  Result Value Ref Range   Color, Urine STRAW (A) YELLOW   APPearance CLEAR CLEAR   Specific Gravity, Urine 1.012 1.005 - 1.030   pH 5.0 5.0 - 8.0   Glucose, UA NEGATIVE NEGATIVE mg/dL   Hgb urine dipstick NEGATIVE NEGATIVE  Bilirubin Urine NEGATIVE NEGATIVE   Ketones, ur 5 (A) NEGATIVE mg/dL   Protein, ur NEGATIVE NEGATIVE mg/dL   Nitrite NEGATIVE NEGATIVE   Leukocytes,Ua NEGATIVE NEGATIVE    Comment: Performed at [facility] [address], [place], [place] [address]  Basic metabolic panel     Status: Abnormal   Collection Time: 02/07/22  3:16 PM  Result Value Ref Range   Sodium 135 135 - 145 mmol/L   Potassium 3.1 (L) 3.5 - 5.1 mmol/L   Chloride 101 98 - 111 mmol/L   CO2 22 22 - 32 mmol/L   Glucose, Bld 96 70 - 99 mg/dL    Comment: Glucose reference range applies only to samples taken after fasting for at least 8 hours.   BUN 6 6 - 20 mg/dL   Creatinine, Ser 0.67 0.44 - 1.00 mg/dL   Calcium 9.2 8.9 - 10.3 mg/dL   GFR, Estimated >60 >60 mL/min    Comment: (NOTE) Calculated using the CKD-EPI Creatinine Equation (2021)    Anion gap 12 5 - 15    Comment: Performed at [facility] [address], [place], [place] [address]  CBC with Differential     Status: Abnormal   Collection Time: 02/07/22  3:16 PM  Result Value Ref Range   WBC 16.7 (H) 4.0 - 10.5 K/uL   RBC 4.66 3.87 - 5.11 MIL/uL   Hemoglobin 12.7 12.0 - 15.0 g/dL   HCT 38.7 36.0 - 46.0 %   MCV 83.0 80.0 - 100.0 fL   MCH 27.3 26.0 - 34.0 pg   MCHC 32.8 30.0 - 36.0 g/dL   RDW 16.0 (H) 11.5 - 15.5 %   Platelets 283 150 - 400 K/uL   nRBC 0.0 0.0 - 0.2 %   Neutrophils Relative % 83 %   Neutro Abs 13.9 (H) 1.7 - 7.7 K/uL   Lymphocytes Relative 8 %   Lymphs Abs 1.4 0.7 - 4.0 K/uL   Monocytes Relative 7 %   Monocytes Absolute 1.1 (H) 0.1 - 1.0 K/uL   Eosinophils Relative 2 %   Eosinophils Absolute 0.3 0.0 - 0.5 K/uL   Basophils Relative 0  %   Basophils Absolute 0.0 0.0 - 0.1 K/uL   Immature Granulocytes 0 %   Abs Immature Granulocytes 0.07 0.00 - 0.07 K/uL    Comment: Performed at [facility] [address] [place], [place] [address]  Glucose, capillary     Status: Abnormal   Collection Time: 02/07/22  6:31 PM  Result Value Ref Range   Glucose-Capillary 182 (H) 70 - 99 mg/dL    Comment: Glucose reference range applies only to samples taken after fasting for at least 8 hours.  I-STAT 7, (LYTES, BLD GAS, ICA, H+H)     Status: Abnormal   Collection Time: 02/07/22  7:04 PM  Result Value Ref Range   pH, Arterial 7.066 (LL) 7.35 - 7.45   pCO2 arterial 103.8 (HH) 32 - 48 mmHg   pO2, Arterial 474 (H) 83 - 108 mmHg   Bicarbonate 30.0 (H) 20.0 - 28.0 mmol/L   TCO2 33 (H) 22 - 32 mmol/L   O2 Saturation 100 %   Acid-base deficit 4.0 (H) 0.0 - 2.0 mmol/L   Sodium 137 135 - 145 mmol/L   Potassium 3.5 3.5 - 5.1 mmol/L   Calcium, Ion 1.25 1.15 - 1.40 mmol/L   HCT 43.0 36.0 - 46.0 %   Hemoglobin 14.6 12.0 - 15.0 g/dL  Patient temperature 97.9 F    Collection site art line    Drawn by RT    Sample type ARTERIAL    Comment NOTIFIED PHYSICIAN     DG Abd Portable 1V  Result Date: 02/07/2022 CLINICAL DATA:  Nasogastric tube placement. EXAM: PORTABLE ABDOMEN - 1 VIEW COMPARISON:  None Available. FINDINGS: Nasal/orogastric tube passes well below the diaphragm to curl within the mid stomach, tip projecting in the fundus. Hazy opacity noted at the right lung base, better visualized on the current portable chest radiograph. This may reflect atelectasis or infection. IMPRESSION: 1. Well-positioned nasal/orogastric tube. 2. Hazy right lung base opacity. Consider pneumonia in the proper clinical setting. Electronically Signed   By: Lajean Manes M.D.   On: 02/07/2022 17:16   DG Chest Port 1 View  Result Date: 02/07/2022 CLINICAL DATA:  Endotracheal tube placement. Also reported central line placement. EXAM: PORTABLE CHEST 1 VIEW  COMPARISON:  02/07/2022 at 3:52 p.m. FINDINGS: Endotracheal tube tip projects 1.8 cm above the carina. No visualized central line. Heart, mediastinum and hila are unremarkable. Clear lungs. IMPRESSION: 1. Well-positioned endotracheal tube. Electronically Signed   By: Lajean Manes M.D.   On: 02/07/2022 17:15   DG Chest Portable 1 View  Result Date: 02/07/2022 CLINICAL DATA:  Shortness of breath. EXAM: PORTABLE CHEST 1 VIEW COMPARISON:  Chest x-ray dated 02/22/2017. FINDINGS: Heart size and mediastinal contours are within normal limits. Mild prominence of the central perihilar bronchovascular markings bilaterally. Lungs are otherwise clear. No pleural effusion or pneumothorax is seen. Osseous structures about the chest are unremarkable. IMPRESSION: Mild prominence of the central perihilar bronchovascular markings bilaterally, compatible with viral bronchitis or reactive airway disease. No evidence of a consolidating pneumonia. Electronically Signed   By: Franki Cabot M.D.   On: 02/07/2022 16:08    Review of Systems- unable to be obtained due to mental status Blood pressure (!) 170/106, pulse (!) 137, temperature 98.2 F (36.8 C), temperature source Oral, resp. rate 16, height 5' (1.524 m), weight 71.1 kg, SpO2 99 %. Physical Exam Critically ill appearing young woman Breath stacking on the vent, high peak pressures ~50, Pplat 26 Inspiratory and expiratory wheezing Tachycardic, reg rhythm Abd soft, NT No peripheral eddema  Assessment/Plan: Hypertensive emergency -cleviprex for goal SBP <160  Acute hypoxic and hypercapnic respiratory failure due to acute asthma exacerbation -LTVV; switch to PRVC -long e-time, low rate-- have increased it slightly based on previous ABGs. Permissive hypercapnia is appropriate here -steroids -bronchodilators -ketamine and magnesium given -NMB to prevent any vent dyssynchrony -needs better outpatient control of her asthma-- needs an aggressive OP regimen -adding  montelukast, antihistamine -bicarb gtt; 2 amps given as a bolus -waiting on repeat ABG  On ECMO watch. Likely can be stabilized with permissive hypercapnia with appropriate bronchodilators, NMB, high dose steroids, antihistamines. Cardiac instability from her acidosis would be an indication for progression to ECMO. Consult reviewed with Dr. Haroldine Laws.    Kaitlyn Mcneil Date: 02/07/2022 Time: 7:20 PM

## 2022-02-07 NOTE — ED Provider Triage Note (Signed)
Emergency Medicine Provider Triage Evaluation Note  Kaitlyn Mcneil , a 27 y.o. female  was evaluated in triage.  Pt complains of shortness of breath, wheezing, chest tightness, and cough since last night consistent with previous asthma exacerbations.  Patient uses daily preventative inhaler as well as a rescue inhaler which she has used 8 times this morning without any relief of her symptoms.  States that she does not often have asthma exacerbations but did require over night stay in the hospital in December 2023 for an acute asthma exacerbation.  Possible sick contacts with influenza.  No fever, chills, nausea, vomiting, abdominal pain.  No recent travel, surgery, birth control or hormone use, leg pain or leg swelling, or history of DVT/PE.  Review of Systems  Positive: See HPI Negative: See HPI  Physical Exam  BP (!) 181/126 (BP Location: Right Arm)   Pulse (!) 126   Temp 99 F (37.2 C)   Resp (!) 24   Ht 5' (1.524 m)   Wt 68 kg   SpO2 93%   BMI 29.29 kg/m  Gen:   Awake, mild distress Resp:  Mild increased effort, audible wheezing, mild inspiratory wheezing, moderate and expiratory wheezing MSK:   Moves extremities without difficulty no lower extremity edema Other:  Tachycardia, abdomen soft nontender, patent airway  Medical Decision Making  Medically screening exam initiated at 3:09 PM.  Appropriate orders placed.  Kaitlyn Mcneil was informed that the remainder of the evaluation will be completed by another provider, this initial triage assessment does not replace that evaluation, and the importance of remaining in the ED until their evaluation is complete.    Suzzette Righter, PA-C 02/07/22 1512

## 2022-02-07 NOTE — ED Notes (Signed)
Pt noted to have increased work of breathing and increased oxygen requirements while in triage. Pt moved to room and primary RN and MD at bedside.

## 2022-02-07 NOTE — H&P (Addendum)
NAME:  Kaitlyn Mcneil, MRN:  [id number], DOB:  03-19-1995, LOS: 0 ADMISSION DATE:  02/07/2022, CONSULTATION DATE:  02/07/22 REFERRING MD:  Zenia Resides, CHIEF COMPLAINT:  dyspnea   History of Present Illness:  27yF with history of asthma, AR, eczema who presented today with acute dyspnea. Tried home nebulizers/rescue inhalers without relief. Has been around others with URIs. Has had fever, cough. Reportedly hospitalized in 12/2021 for asthma exacerbation.   In ED she was given continuous neb, steroids, IV magnesium and placed on BiPAP. She continued to have remarkably increased work of breathing, fatigue and decision made with Dr. Zenia Resides to intubate. Required a great deal of sedation and neuromuscular blockade to achieve adequate minute ventilation. After sedation and NMB on PRVC 400/14/5 peaks 40-55, plateau 20-25, tidal volumes ~400. Intrinsic peep of 13.  Pertinent  Medical History  Asthma  AR  Eczema  Significant Hospital Events: Including procedures, antibiotic start and stop dates in addition to other pertinent events   02/07/22 intubated, deep sedation, nimbex  Interim History / Subjective:    Objective   Blood pressure (!) 170/106, pulse (!) 137, temperature 98.2 F (36.8 C), temperature source Oral, resp. rate 16, height 5' (1.524 m), weight 71.1 kg, SpO2 98 %.    Vent Mode: PRVC FiO2 (%):  [100 %] 100 % Set Rate:  [14 bmp] 14 bmp Vt Set:  [400 mL] 400 mL PEEP:  [5 cmH20] 5 cmH20 Pressure Support:  [10 cmH20] 10 cmH20 Plateau Pressure:  [26 cmH20] 26 cmH20   Intake/Output Summary (Last 24 hours) at 02/07/2022 1826 Last data filed at 02/07/2022 1809 Gross per 24 hour  Intake 50 ml  Output 75 ml  Net -25 ml   Filed Weights   02/07/22 1434 02/07/22 1800  Weight: 68 kg 71.1 kg    Examination: General appearance: 27 y.o., female, intubated  Eyes: PERRL HENT: NCAT; MMM Neck: Trachea midline; no lymphadenopathy, no JVD Lungs: exp wheeze bl, equal chest rise CV: tachy RR, no  murmur  Abdomen: Soft, non-tender; non-distended, BS present  Extremities: No peripheral edema, warm Skin: Normal turgor and texture; no rash Neuro: rass -4   Resolved Hospital Problem list     Assessment & Plan:  # Life-threatening asthma exacerbation - RVP, BCx, urine legionella, trach aspirate - place a-line, abg q6 - full vent support, very permissive hypercapnia pH 7.1-7.2  acceptable provided there's no significant hemodynamic issue - deep sedation, nimbex - steroids, scheduled nebs  # Hypertensive urgency - cleviprex for sbp <160  # Leukocytosis - trend cbc  Best Practice (right click and "Reselect all SmartList Selections" daily)   Diet/type: NPO w/ meds via tube DVT prophylaxis: prophylactic heparin  GI prophylaxis: PPI Lines: N/A Foley:  N/A Code Status:  full code Last date of multidisciplinary goals of care discussion [will update today]  Labs   CBC: Recent Labs  Lab 02/07/22 1516  WBC 16.7*  NEUTROABS 13.9*  HGB 12.7  HCT 38.7  MCV 83.0  PLT 003    Basic Metabolic Panel: Recent Labs  Lab 02/07/22 1516  NA 135  K 3.1*  CL 101  CO2 22  GLUCOSE 96  BUN 6  CREATININE 0.67  CALCIUM 9.2   GFR: Estimated Creatinine Clearance: 94.5 mL/min (by C-G formula based on SCr of 0.67 mg/dL). Recent Labs  Lab 02/07/22 1516  WBC 16.7*    Liver Function Tests: No results for input(s): "AST", "ALT", "ALKPHOS", "BILITOT", "PROT", "ALBUMIN" in the last 168 hours. No results for  input(s): "LIPASE", "AMYLASE" in the last 168 hours. No results for input(s): "AMMONIA" in the last 168 hours.  ABG No results found for: "PHART", "PCO2ART", "PO2ART", "HCO3", "TCO2", "ACIDBASEDEF", "O2SAT"   Coagulation Profile: No results for input(s): "INR", "PROTIME" in the last 168 hours.  Cardiac Enzymes: No results for input(s): "CKTOTAL", "CKMB", "CKMBINDEX", "TROPONINI" in the last 168 hours.  HbA1C: No results found for: "HGBA1C"  CBG: No results for  input(s): "GLUCAP" in the last 168 hours.  Review of Systems:   Unable to obtain in setting of her   Past Medical History:  She,  has a past medical history of Asthma.   Surgical History:  History reviewed. No pertinent surgical history.   Social History:   reports that she has never smoked. She has never used smokeless tobacco.   Family History:  Her family history is not on file.   Allergies Allergies  Allergen Reactions   Shellfish Allergy      Home Medications  Prior to Admission medications   Not on File     Critical care time: 35 minutes

## 2022-02-07 NOTE — Interval H&P Note (Signed)
History and Physical Interval Note:  02/07/2022 11:31 PM  Kaitlyn Mcneil  has presented today for surgery, with the diagnosis of respiratory compromise.  The various methods of treatment have been discussed with the patient and family. After consideration of risks, benefits and other options for treatment, the patient has consented to  Procedure(s): CANNULATION FOR ECMO (EXTRACORPOREAL MEMBRANE OXYGENATION) (N/A) as a surgical intervention.  The patient's history has been reviewed, patient examined, no change in status, stable for surgery.  I have reviewed the patient's chart and labs.  Questions were answered to the patient's satisfaction.     Ariane Ditullio

## 2022-02-07 NOTE — Procedures (Signed)
Arterial Catheter Insertion Procedure Note  Kaitlyn Mcneil  [id number]  May 18, 1995  Date:02/07/22  Time:7:11 PM    Provider Performing: Catha Brow    Procedure: Insertion of Arterial Line [phone]) without US guidance  Indication(s) Blood pressure monitoring and/or need for frequent ABGs  Consent Unable to obtain consent due to emergent nature of procedure.  Anesthesia None   Time Out Verified patient identification, verified procedure, site/side was marked, verified correct patient position, special equipment/implants available, medications/allergies/relevant history reviewed, required imaging and test results available.   Sterile Technique Maximal sterile technique including full sterile barrier drape, hand hygiene, sterile gown, sterile gloves, mask, hair covering, sterile ultrasound probe cover (if used).   Procedure Description Area of catheter insertion was cleaned with chlorhexidine and draped in sterile fashion. Without real-time ultrasound guidance an arterial catheter was placed into the left radial artery.  Appropriate arterial tracings confirmed on monitor.     Complications/Tolerance None; patient tolerated the procedure well.   EBL Minimal   Specimen(s) None

## 2022-02-07 NOTE — Progress Notes (Signed)
Pt transported from ED 33 to 3M03 on vent. Pt stable throughout with no complications. VS WNL

## 2022-02-07 NOTE — Procedures (Signed)
Central Venous Catheter Insertion Procedure Note  Kaitlyn Mcneil  [id number]  November 09, 1995  Date:02/07/22  Time:10:54 PM   Provider Performing:Artina Minella Shearon Stalls   Procedure: Insertion of Non-tunneled Central Venous [phone]) with US guidance ([id number])   Indication(s) Medication administration and Difficult access  Consent Risks of the procedure as well as the alternatives and risks of each were explained to the patient and/or caregiver.  Consent for the procedure was obtained and is signed in the bedside chart  Anesthesia Topical only with 1% lidocaine   Timeout Verified patient identification, verified procedure, site/side was marked, verified correct patient position, special equipment/implants available, medications/allergies/relevant history reviewed, required imaging and test results available.  Sterile Technique Maximal sterile technique including full sterile barrier drape, hand hygiene, sterile gown, sterile gloves, mask, hair covering, sterile ultrasound probe cover (if used).  Procedure Description Area of catheter insertion was cleaned with chlorhexidine and draped in sterile fashion.  With real-time ultrasound guidance a central venous catheter was placed into the left internal jugular vein. Nonpulsatile blood flow and easy flushing noted in all ports.  The catheter was sutured in place and sterile dressing applied.  Complications/Tolerance None; patient tolerated the procedure well. Chest X-ray is ordered to verify placement for internal jugular or subclavian cannulation.   Chest x-ray is not ordered for femoral cannulation.  EBL Minimal  Specimen(s) None   Montey Hora, PA - [facility] For pager details, please see AMION or use Epic chat  After 1900, please call [facility] for cross coverage needs 02/07/2022, 10:54 PM

## 2022-02-08 ENCOUNTER — Inpatient Hospital Stay (HOSPITAL_COMMUNITY): Payer: BC Managed Care – PPO

## 2022-02-08 ENCOUNTER — Inpatient Hospital Stay: Payer: Self-pay

## 2022-02-08 ENCOUNTER — Encounter (HOSPITAL_COMMUNITY): Payer: Self-pay | Admitting: Internal Medicine

## 2022-02-08 DIAGNOSIS — Z9911 Dependence on respirator [ventilator] status: Secondary | ICD-10-CM

## 2022-02-08 DIAGNOSIS — R0603 Acute respiratory distress: Secondary | ICD-10-CM

## 2022-02-08 DIAGNOSIS — J9601 Acute respiratory failure with hypoxia: Secondary | ICD-10-CM

## 2022-02-08 DIAGNOSIS — J4551 Severe persistent asthma with (acute) exacerbation: Secondary | ICD-10-CM

## 2022-02-08 DIAGNOSIS — J9602 Acute respiratory failure with hypercapnia: Secondary | ICD-10-CM

## 2022-02-08 LAB — POCT I-STAT 7, (LYTES, BLD GAS, ICA,H+H)
Acid-Base Excess: 0 mmol/L (ref 0.0–2.0)
Acid-Base Excess: 3 mmol/L — ABNORMAL HIGH (ref 0.0–2.0)
Acid-Base Excess: 1 mmol/L (ref 0.0–2.0)
Acid-Base Excess: 0 mmol/L (ref 0.0–2.0)
Acid-base deficit: 8 mmol/L — ABNORMAL HIGH (ref 0.0–2.0)
Acid-base deficit: 5 mmol/L — ABNORMAL HIGH (ref 0.0–2.0)
Acid-base deficit: 5 mmol/L — ABNORMAL HIGH (ref 0.0–2.0)
Acid-base deficit: 5 mmol/L — ABNORMAL HIGH (ref 0.0–2.0)
Acid-base deficit: 6 mmol/L — ABNORMAL HIGH (ref 0.0–2.0)
Acid-base deficit: 4 mmol/L — ABNORMAL HIGH (ref 0.0–2.0)
Acid-base deficit: 8 mmol/L — ABNORMAL HIGH (ref 0.0–2.0)
Acid-base deficit: 5 mmol/L — ABNORMAL HIGH (ref 0.0–2.0)
Acid-base deficit: 5 mmol/L — ABNORMAL HIGH (ref 0.0–2.0)
Acid-base deficit: 1 mmol/L (ref 0.0–2.0)
Bicarbonate: 22.9 mmol/L (ref 20.0–28.0)
Bicarbonate: 19.2 mmol/L — ABNORMAL LOW (ref 20.0–28.0)
Bicarbonate: 21.3 mmol/L (ref 20.0–28.0)
Bicarbonate: 21.3 mmol/L (ref 20.0–28.0)
Bicarbonate: 23.6 mmol/L (ref 20.0–28.0)
Bicarbonate: 17.3 mmol/L — ABNORMAL LOW (ref 20.0–28.0)
Bicarbonate: 20.5 mmol/L (ref 20.0–28.0)
Bicarbonate: 19.6 mmol/L — ABNORMAL LOW (ref 20.0–28.0)
Bicarbonate: 20.4 mmol/L (ref 20.0–28.0)
Bicarbonate: 24.3 mmol/L (ref 20.0–28.0)
Bicarbonate: 26.9 mmol/L (ref 20.0–28.0)
Bicarbonate: 25.8 mmol/L (ref 20.0–28.0)
Bicarbonate: 24.1 mmol/L (ref 20.0–28.0)
Bicarbonate: 24.1 mmol/L (ref 20.0–28.0)
Calcium, Ion: 0.9 mmol/L — ABNORMAL LOW (ref 1.15–1.40)
Calcium, Ion: 0.92 mmol/L — ABNORMAL LOW (ref 1.15–1.40)
Calcium, Ion: 0.96 mmol/L — ABNORMAL LOW (ref 1.15–1.40)
Calcium, Ion: 0.97 mmol/L — ABNORMAL LOW (ref 1.15–1.40)
Calcium, Ion: 1.01 mmol/L — ABNORMAL LOW (ref 1.15–1.40)
Calcium, Ion: 0.94 mmol/L — ABNORMAL LOW (ref 1.15–1.40)
Calcium, Ion: 0.97 mmol/L — ABNORMAL LOW (ref 1.15–1.40)
Calcium, Ion: 0.97 mmol/L — ABNORMAL LOW (ref 1.15–1.40)
Calcium, Ion: 1.02 mmol/L — ABNORMAL LOW (ref 1.15–1.40)
Calcium, Ion: 1.05 mmol/L — ABNORMAL LOW (ref 1.15–1.40)
Calcium, Ion: 1.13 mmol/L — ABNORMAL LOW (ref 1.15–1.40)
Calcium, Ion: 1.17 mmol/L (ref 1.15–1.40)
Calcium, Ion: 1.17 mmol/L (ref 1.15–1.40)
Calcium, Ion: 1.19 mmol/L (ref 1.15–1.40)
HCT: 28 % — ABNORMAL LOW (ref 36.0–46.0)
HCT: 28 % — ABNORMAL LOW (ref 36.0–46.0)
HCT: 28 % — ABNORMAL LOW (ref 36.0–46.0)
HCT: 29 % — ABNORMAL LOW (ref 36.0–46.0)
HCT: 30 % — ABNORMAL LOW (ref 36.0–46.0)
HCT: 28 % — ABNORMAL LOW (ref 36.0–46.0)
HCT: 31 % — ABNORMAL LOW (ref 36.0–46.0)
HCT: 30 % — ABNORMAL LOW (ref 36.0–46.0)
HCT: 30 % — ABNORMAL LOW (ref 36.0–46.0)
HCT: 24 % — ABNORMAL LOW (ref 36.0–46.0)
HCT: 23 % — ABNORMAL LOW (ref 36.0–46.0)
HCT: 23 % — ABNORMAL LOW (ref 36.0–46.0)
HCT: 24 % — ABNORMAL LOW (ref 36.0–46.0)
HCT: 24 % — ABNORMAL LOW (ref 36.0–46.0)
Hemoglobin: 9.5 g/dL — ABNORMAL LOW (ref 12.0–15.0)
Hemoglobin: 10.2 g/dL — ABNORMAL LOW (ref 12.0–15.0)
Hemoglobin: 9.5 g/dL — ABNORMAL LOW (ref 12.0–15.0)
Hemoglobin: 9.5 g/dL — ABNORMAL LOW (ref 12.0–15.0)
Hemoglobin: 9.9 g/dL — ABNORMAL LOW (ref 12.0–15.0)
Hemoglobin: 10.5 g/dL — ABNORMAL LOW (ref 12.0–15.0)
Hemoglobin: 9.5 g/dL — ABNORMAL LOW (ref 12.0–15.0)
Hemoglobin: 10.2 g/dL — ABNORMAL LOW (ref 12.0–15.0)
Hemoglobin: 10.2 g/dL — ABNORMAL LOW (ref 12.0–15.0)
Hemoglobin: 8.2 g/dL — ABNORMAL LOW (ref 12.0–15.0)
Hemoglobin: 7.8 g/dL — ABNORMAL LOW (ref 12.0–15.0)
Hemoglobin: 7.8 g/dL — ABNORMAL LOW (ref 12.0–15.0)
Hemoglobin: 8.2 g/dL — ABNORMAL LOW (ref 12.0–15.0)
Hemoglobin: 8.2 g/dL — ABNORMAL LOW (ref 12.0–15.0)
O2 Saturation: 100 %
O2 Saturation: 100 %
O2 Saturation: 100 %
O2 Saturation: 100 %
O2 Saturation: 92 %
O2 Saturation: 91 %
O2 Saturation: 92 %
O2 Saturation: 93 %
O2 Saturation: 94 %
O2 Saturation: 99 %
O2 Saturation: 98 %
O2 Saturation: 99 %
O2 Saturation: 99 %
O2 Saturation: 99 %
Patient temperature: 36
Patient temperature: 36.7
Patient temperature: 36.4
Patient temperature: 36.6
Patient temperature: 36.4
Patient temperature: 36.4
Patient temperature: 36.4
Patient temperature: 36.5
Patient temperature: 36.5
Patient temperature: 36.1
Patient temperature: 36.6
Potassium: 3.3 mmol/L — ABNORMAL LOW (ref 3.5–5.1)
Potassium: 2.8 mmol/L — ABNORMAL LOW (ref 3.5–5.1)
Potassium: 2.9 mmol/L — ABNORMAL LOW (ref 3.5–5.1)
Potassium: 2.9 mmol/L — ABNORMAL LOW (ref 3.5–5.1)
Potassium: 3.1 mmol/L — ABNORMAL LOW (ref 3.5–5.1)
Potassium: 2.7 mmol/L — CL (ref 3.5–5.1)
Potassium: 2.9 mmol/L — ABNORMAL LOW (ref 3.5–5.1)
Potassium: 2.7 mmol/L — CL (ref 3.5–5.1)
Potassium: 2.9 mmol/L — ABNORMAL LOW (ref 3.5–5.1)
Potassium: 2.7 mmol/L — CL (ref 3.5–5.1)
Potassium: 2.8 mmol/L — ABNORMAL LOW (ref 3.5–5.1)
Potassium: 3.4 mmol/L — ABNORMAL LOW (ref 3.5–5.1)
Potassium: 3.9 mmol/L (ref 3.5–5.1)
Potassium: 4.1 mmol/L (ref 3.5–5.1)
Sodium: 143 mmol/L (ref 135–145)
Sodium: 140 mmol/L (ref 135–145)
Sodium: 142 mmol/L (ref 135–145)
Sodium: 143 mmol/L (ref 135–145)
Sodium: 143 mmol/L (ref 135–145)
Sodium: 141 mmol/L (ref 135–145)
Sodium: 142 mmol/L (ref 135–145)
Sodium: 142 mmol/L (ref 135–145)
Sodium: 142 mmol/L (ref 135–145)
Sodium: 143 mmol/L (ref 135–145)
Sodium: 144 mmol/L (ref 135–145)
Sodium: 145 mmol/L (ref 135–145)
Sodium: 142 mmol/L (ref 135–145)
Sodium: 144 mmol/L (ref 135–145)
TCO2: 25 mmol/L (ref 22–32)
TCO2: 20 mmol/L — ABNORMAL LOW (ref 22–32)
TCO2: 23 mmol/L (ref 22–32)
TCO2: 23 mmol/L (ref 22–32)
TCO2: 25 mmol/L (ref 22–32)
TCO2: 18 mmol/L — ABNORMAL LOW (ref 22–32)
TCO2: 22 mmol/L (ref 22–32)
TCO2: 21 mmol/L — ABNORMAL LOW (ref 22–32)
TCO2: 22 mmol/L (ref 22–32)
TCO2: 25 mmol/L (ref 22–32)
TCO2: 28 mmol/L (ref 22–32)
TCO2: 27 mmol/L (ref 22–32)
TCO2: 25 mmol/L (ref 22–32)
TCO2: 25 mmol/L (ref 22–32)
pCO2 arterial: 86.5 mmHg (ref 32–48)
pCO2 arterial: 36.6 mmHg (ref 32–48)
pCO2 arterial: 41.7 mmHg (ref 32–48)
pCO2 arterial: 43.1 mmHg (ref 32–48)
pCO2 arterial: 59.4 mmHg — ABNORMAL HIGH (ref 32–48)
pCO2 arterial: 33.9 mmHg (ref 32–48)
pCO2 arterial: 35.5 mmHg (ref 32–48)
pCO2 arterial: 34.7 mmHg (ref 32–48)
pCO2 arterial: 36.5 mmHg (ref 32–48)
pCO2 arterial: 35.8 mmHg (ref 32–48)
pCO2 arterial: 35.7 mmHg (ref 32–48)
pCO2 arterial: 37.8 mmHg (ref 32–48)
pCO2 arterial: 34.5 mmHg (ref 32–48)
pCO2 arterial: 39.2 mmHg (ref 32–48)
pH, Arterial: 7.03 — CL (ref 7.35–7.45)
pH, Arterial: 7.208 — ABNORMAL LOW (ref 7.35–7.45)
pH, Arterial: 7.302 — ABNORMAL LOW (ref 7.35–7.45)
pH, Arterial: 7.311 — ABNORMAL LOW (ref 7.35–7.45)
pH, Arterial: 7.326 — ABNORMAL LOW (ref 7.35–7.45)
pH, Arterial: 7.314 — ABNORMAL LOW (ref 7.35–7.45)
pH, Arterial: 7.368 (ref 7.35–7.45)
pH, Arterial: 7.358 (ref 7.35–7.45)
pH, Arterial: 7.353 (ref 7.35–7.45)
pH, Arterial: 7.437 (ref 7.35–7.45)
pH, Arterial: 7.483 — ABNORMAL HIGH (ref 7.35–7.45)
pH, Arterial: 7.439 (ref 7.35–7.45)
pH, Arterial: 7.392 (ref 7.35–7.45)
pH, Arterial: 7.45 (ref 7.35–7.45)
pO2, Arterial: 394 mmHg — ABNORMAL HIGH (ref 83–108)
pO2, Arterial: 316 mmHg — ABNORMAL HIGH (ref 83–108)
pO2, Arterial: 345 mmHg — ABNORMAL HIGH (ref 83–108)
pO2, Arterial: 378 mmHg — ABNORMAL HIGH (ref 83–108)
pO2, Arterial: 68 mmHg — ABNORMAL LOW (ref 83–108)
pO2, Arterial: 60 mmHg — ABNORMAL LOW (ref 83–108)
pO2, Arterial: 67 mmHg — ABNORMAL LOW (ref 83–108)
pO2, Arterial: 67 mmHg — ABNORMAL LOW (ref 83–108)
pO2, Arterial: 73 mmHg — ABNORMAL LOW (ref 83–108)
pO2, Arterial: 121 mmHg — ABNORMAL HIGH (ref 83–108)
pO2, Arterial: 100 mmHg (ref 83–108)
pO2, Arterial: 123 mmHg — ABNORMAL HIGH (ref 83–108)
pO2, Arterial: 117 mmHg — ABNORMAL HIGH (ref 83–108)
pO2, Arterial: 139 mmHg — ABNORMAL HIGH (ref 83–108)

## 2022-02-08 LAB — GLUCOSE, CAPILLARY
Glucose-Capillary: 286 mg/dL — ABNORMAL HIGH (ref 70–99)
Glucose-Capillary: 329 mg/dL — ABNORMAL HIGH (ref 70–99)
Glucose-Capillary: 273 mg/dL — ABNORMAL HIGH (ref 70–99)
Glucose-Capillary: 256 mg/dL — ABNORMAL HIGH (ref 70–99)
Glucose-Capillary: 253 mg/dL — ABNORMAL HIGH (ref 70–99)
Glucose-Capillary: 236 mg/dL — ABNORMAL HIGH (ref 70–99)
Glucose-Capillary: 195 mg/dL — ABNORMAL HIGH (ref 70–99)
Glucose-Capillary: 173 mg/dL — ABNORMAL HIGH (ref 70–99)
Glucose-Capillary: 165 mg/dL — ABNORMAL HIGH (ref 70–99)
Glucose-Capillary: 127 mg/dL — ABNORMAL HIGH (ref 70–99)
Glucose-Capillary: 88 mg/dL (ref 70–99)
Glucose-Capillary: 92 mg/dL (ref 70–99)
Glucose-Capillary: 107 mg/dL — ABNORMAL HIGH (ref 70–99)
Glucose-Capillary: 102 mg/dL — ABNORMAL HIGH (ref 70–99)
Glucose-Capillary: 114 mg/dL — ABNORMAL HIGH (ref 70–99)
Glucose-Capillary: 115 mg/dL — ABNORMAL HIGH (ref 70–99)
Glucose-Capillary: 154 mg/dL — ABNORMAL HIGH (ref 70–99)

## 2022-02-08 LAB — CBC
HCT: 28.5 % — ABNORMAL LOW (ref 36.0–46.0)
HCT: 28.2 % — ABNORMAL LOW (ref 36.0–46.0)
HCT: 23.2 % — ABNORMAL LOW (ref 36.0–46.0)
Hemoglobin: 9 g/dL — ABNORMAL LOW (ref 12.0–15.0)
Hemoglobin: 9 g/dL — ABNORMAL LOW (ref 12.0–15.0)
Hemoglobin: 7.4 g/dL — ABNORMAL LOW (ref 12.0–15.0)
MCH: 27.6 pg (ref 26.0–34.0)
MCH: 27.5 pg (ref 26.0–34.0)
MCH: 27.2 pg (ref 26.0–34.0)
MCHC: 31.6 g/dL (ref 30.0–36.0)
MCHC: 31.9 g/dL (ref 30.0–36.0)
MCHC: 31.9 g/dL (ref 30.0–36.0)
MCV: 87.4 fL (ref 80.0–100.0)
MCV: 86.2 fL (ref 80.0–100.0)
MCV: 85.3 fL (ref 80.0–100.0)
Platelets: 200 10*3/uL (ref 150–400)
Platelets: 225 10*3/uL (ref 150–400)
Platelets: 172 10*3/uL (ref 150–400)
RBC: 3.26 MIL/uL — ABNORMAL LOW (ref 3.87–5.11)
RBC: 3.27 MIL/uL — ABNORMAL LOW (ref 3.87–5.11)
RBC: 2.72 MIL/uL — ABNORMAL LOW (ref 3.87–5.11)
RDW: 16 % — ABNORMAL HIGH (ref 11.5–15.5)
RDW: 16.1 % — ABNORMAL HIGH (ref 11.5–15.5)
RDW: 16.2 % — ABNORMAL HIGH (ref 11.5–15.5)
WBC: 25.3 10*3/uL — ABNORMAL HIGH (ref 4.0–10.5)
WBC: 29.3 10*3/uL — ABNORMAL HIGH (ref 4.0–10.5)
WBC: 19.6 10*3/uL — ABNORMAL HIGH (ref 4.0–10.5)
nRBC: 0 % (ref 0.0–0.2)
nRBC: 0 % (ref 0.0–0.2)
nRBC: 0 % (ref 0.0–0.2)

## 2022-02-08 LAB — BASIC METABOLIC PANEL
Anion gap: 21 — ABNORMAL HIGH (ref 5–15)
Anion gap: 6 (ref 5–15)
BUN: 6 mg/dL (ref 6–20)
BUN: 5 mg/dL — ABNORMAL LOW (ref 6–20)
CO2: 18 mmol/L — ABNORMAL LOW (ref 22–32)
CO2: 25 mmol/L (ref 22–32)
Calcium: 7 mg/dL — ABNORMAL LOW (ref 8.9–10.3)
Calcium: 7.7 mg/dL — ABNORMAL LOW (ref 8.9–10.3)
Chloride: 101 mmol/L (ref 98–111)
Chloride: 112 mmol/L — ABNORMAL HIGH (ref 98–111)
Creatinine, Ser: 1.02 mg/dL — ABNORMAL HIGH (ref 0.44–1.00)
Creatinine, Ser: 0.63 mg/dL (ref 0.44–1.00)
GFR, Estimated: >60 mL/min (ref >60)
GFR, Estimated: >60 mL/min (ref >60)
Glucose, Bld: 327 mg/dL — ABNORMAL HIGH (ref 70–99)
Glucose, Bld: 90 mg/dL (ref 70–99)
Potassium: 2.8 mmol/L — ABNORMAL LOW (ref 3.5–5.1)
Potassium: 3.6 mmol/L (ref 3.5–5.1)
Sodium: 140 mmol/L (ref 135–145)
Sodium: 143 mmol/L (ref 135–145)

## 2022-02-08 LAB — GLOBAL TEG PANEL
CFF Max Amplitude: 20.3 mm (ref 15–32)
CK with Heparinase (R): 7.6 min (ref 4.3–8.3)
Citrated Functional Fibrinogen: 370.4 mg/dL (ref 278–581)
Citrated Kaolin (K): 2.8 min — ABNORMAL HIGH (ref 0.8–2.1)
Citrated Kaolin (MA): 57.2 mm (ref 52–69)
Citrated Kaolin (R): >17 min — ABNORMAL HIGH (ref 4.6–9.1)
Citrated Kaolin Angle: 56.5 deg — ABNORMAL LOW (ref 63–78)
Citrated Rapid TEG (MA): 64.2 mm (ref 52–70)

## 2022-02-08 LAB — COMPREHENSIVE METABOLIC PANEL
ALT: 16 U/L (ref 0–44)
AST: 46 U/L — ABNORMAL HIGH (ref 15–41)
Albumin: 3 g/dL — ABNORMAL LOW (ref 3.5–5.0)
Alkaline Phosphatase: 49 U/L (ref 38–126)
Anion gap: 25 — ABNORMAL HIGH (ref 5–15)
BUN: 5 mg/dL — ABNORMAL LOW (ref 6–20)
CO2: 14 mmol/L — ABNORMAL LOW (ref 22–32)
Calcium: 7.2 mg/dL — ABNORMAL LOW (ref 8.9–10.3)
Chloride: 103 mmol/L (ref 98–111)
Creatinine, Ser: 1.08 mg/dL — ABNORMAL HIGH (ref 0.44–1.00)
GFR, Estimated: >60 mL/min (ref >60)
Glucose, Bld: 324 mg/dL — ABNORMAL HIGH (ref 70–99)
Potassium: 3 mmol/L — ABNORMAL LOW (ref 3.5–5.1)
Sodium: 142 mmol/L (ref 135–145)
Total Bilirubin: 0.2 mg/dL — ABNORMAL LOW (ref 0.3–1.2)
Total Protein: 5.7 g/dL — ABNORMAL LOW (ref 6.5–8.1)

## 2022-02-08 LAB — RESPIRATORY PANEL BY PCR

## 2022-02-08 LAB — PROTIME-INR
INR: 1.4 — ABNORMAL HIGH (ref 0.8–1.2)
INR: 1.4 — ABNORMAL HIGH (ref 0.8–1.2)
Prothrombin Time: 17 seconds — ABNORMAL HIGH (ref 11.4–15.2)
Prothrombin Time: 16.6 seconds — ABNORMAL HIGH (ref 11.4–15.2)

## 2022-02-08 LAB — FIBRINOGEN: Fibrinogen: 263 mg/dL (ref 210–475)

## 2022-02-08 LAB — LACTIC ACID, PLASMA
Lactic Acid, Venous: >9 mmol/L (ref 0.5–1.9)
Lactic Acid, Venous: >9 mmol/L (ref 0.5–1.9)
Lactic Acid, Venous: >9 mmol/L (ref 0.5–1.9)
Lactic Acid, Venous: 0.7 mmol/L (ref 0.5–1.9)

## 2022-02-08 LAB — APTT
aPTT: 197 seconds (ref 24–36)
aPTT: 100 seconds — ABNORMAL HIGH (ref 24–36)
aPTT: 43 seconds — ABNORMAL HIGH (ref 24–36)
aPTT: 48 seconds — ABNORMAL HIGH (ref 24–36)
aPTT: 52 seconds — ABNORMAL HIGH (ref 24–36)
aPTT: 49 seconds — ABNORMAL HIGH (ref 24–36)

## 2022-02-08 LAB — MAGNESIUM
Magnesium: 2.4 mg/dL (ref 1.7–2.4)
Magnesium: 2.3 mg/dL (ref 1.7–2.4)

## 2022-02-08 LAB — HEPATIC FUNCTION PANEL
ALT: 17 U/L (ref 0–44)
AST: 43 U/L — ABNORMAL HIGH (ref 15–41)
Albumin: 3.2 g/dL — ABNORMAL LOW (ref 3.5–5.0)
Alkaline Phosphatase: 49 U/L (ref 38–126)
Bilirubin, Direct: 0.1 mg/dL (ref 0.0–0.2)
Indirect Bilirubin: 0.2 mg/dL — ABNORMAL LOW (ref 0.3–0.9)
Total Bilirubin: 0.3 mg/dL (ref 0.3–1.2)
Total Protein: 5.6 g/dL — ABNORMAL LOW (ref 6.5–8.1)

## 2022-02-08 LAB — HEMOGLOBIN A1C
Hgb A1c MFr Bld: 5.9 % — ABNORMAL HIGH (ref 4.8–5.6)
Mean Plasma Glucose: 122.63 mg/dL

## 2022-02-08 LAB — TRIGLYCERIDES: Triglycerides: 85 mg/dL (ref <150)

## 2022-02-08 LAB — PHOSPHORUS
Phosphorus: 1.5 mg/dL — ABNORMAL LOW (ref 2.5–4.6)
Phosphorus: 1.6 mg/dL — ABNORMAL LOW (ref 2.5–4.6)

## 2022-02-08 LAB — STREP PNEUMONIAE URINARY ANTIGEN: Strep Pneumo Urinary Antigen: NEGATIVE

## 2022-02-08 LAB — POCT ACTIVATED CLOTTING TIME: Activated Clotting Time: 190 seconds

## 2022-02-08 LAB — PREPARE RBC (CROSSMATCH)

## 2022-02-08 LAB — LACTATE DEHYDROGENASE: LDH: 254 U/L — ABNORMAL HIGH (ref 98–192)

## 2022-02-08 MED ORDER — INSULIN REGULAR(HUMAN) IN NACL 100-0.9 UT/100ML-% IV SOLN
INTRAVENOUS | Status: DC
  Administered 2022-02-08: 8 [IU]/h via INTRAVENOUS
  Filled 2022-02-08: qty 100

## 2022-02-08 MED ORDER — CHLORHEXIDINE GLUCONATE CLOTH 2 % EX PADS: 6.0000 | MEDICATED_PAD | Freq: Once | CUTANEOUS | Status: AC

## 2022-02-08 MED ORDER — POTASSIUM CHLORIDE 10 MEQ/50ML IV SOLN
10.0000 meq | INTRAVENOUS | Status: AC
  Administered 2022-02-08 (×6): 10 meq via INTRAVENOUS
  Filled 2022-02-08 (×6): qty 50

## 2022-02-08 MED ORDER — PROSOURCE TF20 ENFIT COMPATIBL EN LIQD
60.0000 mL | Freq: Every day | ENTERAL | Status: DC
  Administered 2022-02-08 – 2022-02-09 (×2): 60 mL
  Filled 2022-02-08 (×2): qty 60

## 2022-02-08 MED ORDER — NOREPINEPHRINE 16 MG/250ML-% IV SOLN
2.0000 ug/min | INTRAVENOUS | Status: DC
  Filled 2022-02-08: qty 250

## 2022-02-08 MED ORDER — SODIUM CHLORIDE 0.9 % IV SOLN
1.0000 g | Freq: Three times a day (TID) | INTRAVENOUS | Status: DC
  Administered 2022-02-08 – 2022-02-11 (×10): 1 g via INTRAVENOUS
  Filled 2022-02-08 (×11): qty 20

## 2022-02-08 MED ORDER — INSULIN ASPART 100 UNIT/ML IJ SOLN
3.0000 [IU] | INTRAMUSCULAR | Status: DC
  Administered 2022-02-09 – 2022-02-11 (×13): 3 [IU] via SUBCUTANEOUS
  Administered 2022-02-13 (×2): 6 [IU] via SUBCUTANEOUS
  Administered 2022-02-13: 3 [IU] via SUBCUTANEOUS
  Administered 2022-02-13 – 2022-02-14 (×2): 6 [IU] via SUBCUTANEOUS
  Administered 2022-02-14: 3 [IU] via SUBCUTANEOUS
  Administered 2022-02-14: 6 [IU] via SUBCUTANEOUS
  Administered 2022-02-14: 3 [IU] via SUBCUTANEOUS
  Administered 2022-02-14 – 2022-02-16 (×9): 6 [IU] via SUBCUTANEOUS
  Administered 2022-02-16 (×3): 3 [IU] via SUBCUTANEOUS
  Administered 2022-02-16: 6 [IU] via SUBCUTANEOUS
  Administered 2022-02-16 – 2022-02-17 (×5): 3 [IU] via SUBCUTANEOUS
  Administered 2022-02-17: 6 [IU] via SUBCUTANEOUS
  Administered 2022-02-18: 3 [IU] via SUBCUTANEOUS
  Administered 2022-02-18 (×3): 6 [IU] via SUBCUTANEOUS
  Administered 2022-02-18: 3 [IU] via SUBCUTANEOUS
  Administered 2022-02-18: 6 [IU] via SUBCUTANEOUS
  Administered 2022-02-19 – 2022-02-20 (×6): 3 [IU] via SUBCUTANEOUS
  Administered 2022-02-20: 6 [IU] via SUBCUTANEOUS

## 2022-02-08 MED ORDER — HEPARIN SODIUM (PORCINE) 1000 UNIT/ML IJ SOLN
INTRAMUSCULAR | Status: DC | PRN
  Administered 2022-02-08: 4000 [IU] via INTRAVENOUS

## 2022-02-08 MED ORDER — IPRATROPIUM BROMIDE 0.02 % IN SOLN
0.5000 mg | RESPIRATORY_TRACT | Status: DC
  Administered 2022-02-08 – 2022-02-17 (×56): 0.5 mg via RESPIRATORY_TRACT
  Filled 2022-02-08 (×56): qty 2.5

## 2022-02-08 MED ORDER — VANCOMYCIN HCL IN DEXTROSE 1-5 GM/200ML-% IV SOLN
1000.0000 mg | Freq: Two times a day (BID) | INTRAVENOUS | Status: DC
  Administered 2022-02-08 – 2022-02-09 (×3): 1000 mg via INTRAVENOUS
  Filled 2022-02-08 (×3): qty 200

## 2022-02-08 MED ORDER — DEXTROSE-NACL 5-0.45 % IV SOLN: INTRAVENOUS | Status: DC

## 2022-02-08 MED ORDER — SODIUM CHLORIDE 0.9% FLUSH: 10.0000 mL | INTRAVENOUS | Status: DC | PRN

## 2022-02-08 MED ORDER — REVEFENACIN 175 MCG/3ML IN SOLN
175.0000 ug | Freq: Every day | RESPIRATORY_TRACT | Status: DC
  Administered 2022-02-08 – 2022-02-17 (×10): 175 ug via RESPIRATORY_TRACT
  Filled 2022-02-08 (×10): qty 3

## 2022-02-08 MED ORDER — VANCOMYCIN HCL 10 G IV SOLR
2000.0000 mg | Freq: Once | INTRAVENOUS | Status: AC
  Administered 2022-02-08: 2000 mg via INTRAVENOUS
  Filled 2022-02-08: qty 2000

## 2022-02-08 MED ORDER — FUROSEMIDE 10 MG/ML IJ SOLN
10.0000 mg | Freq: Once | INTRAMUSCULAR | Status: AC
  Administered 2022-02-08: 10 mg via INTRAVENOUS
  Filled 2022-02-08: qty 2

## 2022-02-08 MED ORDER — POTASSIUM CHLORIDE 20 MEQ PO PACK
40.0000 meq | PACK | ORAL | Status: DC
  Administered 2022-02-08: 40 meq

## 2022-02-08 MED ORDER — DEXTROSE 50 % IV SOLN: 0.0000 mL | INTRAVENOUS | Status: DC | PRN

## 2022-02-08 MED ORDER — POTASSIUM PHOSPHATES 15 MMOLE/5ML IV SOLN
20.0000 mmol | Freq: Once | INTRAVENOUS | Status: AC
  Administered 2022-02-08: 20 mmol via INTRAVENOUS
  Filled 2022-02-08: qty 6.67

## 2022-02-08 MED ORDER — EPINEPHRINE PF 1 MG/ML IJ SOLN
INTRAMUSCULAR | Status: AC
  Filled 2022-02-08: qty 2

## 2022-02-08 MED ORDER — ALBUMIN HUMAN 5 % IV SOLN: 12.5000 g | Freq: Once | INTRAVENOUS | Status: AC

## 2022-02-08 MED ORDER — CALCIUM GLUCONATE-NACL 1-0.675 GM/50ML-% IV SOLN
1.0000 g | Freq: Once | INTRAVENOUS | Status: AC
  Administered 2022-02-08: 1000 mg via INTRAVENOUS
  Filled 2022-02-08: qty 50

## 2022-02-08 MED ORDER — LIDOCAINE HCL 1 % IJ SOLN
INTRAMUSCULAR | Status: AC
  Filled 2022-02-08: qty 20

## 2022-02-08 MED ORDER — POTASSIUM CHLORIDE 10 MEQ/50ML IV SOLN
10.0000 meq | INTRAVENOUS | Status: DC
  Administered 2022-02-08: 10 meq via INTRAVENOUS
  Filled 2022-02-08 (×4): qty 50

## 2022-02-08 MED ORDER — VITAL HIGH PROTEIN PO LIQD: 1000.0000 mL | ORAL | Status: DC

## 2022-02-08 MED ORDER — SODIUM BICARBONATE 8.4 % IV SOLN
100.0000 meq | Freq: Once | INTRAVENOUS | Status: AC
  Administered 2022-02-08: 100 meq via INTRAVENOUS

## 2022-02-08 MED ORDER — BUDESONIDE 0.5 MG/2ML IN SUSP
0.5000 mg | Freq: Two times a day (BID) | RESPIRATORY_TRACT | Status: DC
  Administered 2022-02-08 – 2022-02-19 (×23): 0.5 mg via RESPIRATORY_TRACT
  Filled 2022-02-08 (×23): qty 2

## 2022-02-08 MED ORDER — ARFORMOTEROL TARTRATE 15 MCG/2ML IN NEBU
15.0000 ug | INHALATION_SOLUTION | Freq: Two times a day (BID) | RESPIRATORY_TRACT | Status: DC
  Administered 2022-02-08 – 2022-02-19 (×22): 15 ug via RESPIRATORY_TRACT
  Filled 2022-02-08 (×23): qty 2

## 2022-02-08 MED ORDER — INSULIN DETEMIR 100 UNIT/ML ~~LOC~~ SOLN
5.0000 [IU] | Freq: Two times a day (BID) | SUBCUTANEOUS | Status: DC
  Administered 2022-02-08 – 2022-02-10 (×5): 5 [IU] via SUBCUTANEOUS
  Filled 2022-02-08 (×7): qty 0.05

## 2022-02-08 MED ORDER — MIDAZOLAM HCL 2 MG/2ML IJ SOLN
INTRAMUSCULAR | Status: DC | PRN
  Administered 2022-02-07: 2 mg via INTRAVENOUS

## 2022-02-08 MED ORDER — NOREPINEPHRINE 16 MG/250ML-% IV SOLN
0.0000 ug/min | INTRAVENOUS | Status: DC
  Administered 2022-02-08: 6 ug/min via INTRAVENOUS
  Administered 2022-02-08: 8 ug/min via INTRAVENOUS
  Filled 2022-02-08: qty 250

## 2022-02-08 MED ORDER — ALBUMIN HUMAN 5 % IV SOLN
12.5000 g | Freq: Once | INTRAVENOUS | Status: AC
  Administered 2022-02-08: 12.5 g via INTRAVENOUS

## 2022-02-08 MED ORDER — PROPOFOL 10 MG/ML IV BOLUS
INTRAVENOUS | Status: DC | PRN
  Administered 2022-02-08: 50 mg via INTRAVENOUS
  Administered 2022-02-08: 25 mg via INTRAVENOUS

## 2022-02-08 MED ORDER — SODIUM CHLORIDE 0.9 % IV SOLN: INTRAVENOUS | Status: DC

## 2022-02-08 MED ORDER — SODIUM BICARBONATE 8.4 % IV SOLN
INTRAVENOUS | Status: AC
  Administered 2022-02-08: 100 meq via INTRAVENOUS
  Filled 2022-02-08: qty 100

## 2022-02-08 MED ORDER — SODIUM BICARBONATE 8.4 % IV SOLN: 100.0000 meq | Freq: Once | INTRAVENOUS | Status: AC

## 2022-02-08 MED ORDER — HEPARIN (PORCINE) IN NACL 1000-0.9 UT/500ML-% IV SOLN
INTRAVENOUS | Status: AC
  Filled 2022-02-08: qty 500

## 2022-02-08 MED ORDER — SODIUM CHLORIDE 0.9% FLUSH
10.0000 mL | Freq: Two times a day (BID) | INTRAVENOUS | Status: DC
  Administered 2022-02-08 – 2022-02-17 (×16): 10 mL

## 2022-02-08 MED ORDER — CALCIUM CHLORIDE 10 % IV SOLN
INTRAVENOUS | Status: AC
  Filled 2022-02-08: qty 10

## 2022-02-08 MED ORDER — PANTOPRAZOLE SODIUM 40 MG IV SOLR
40.0000 mg | Freq: Every day | INTRAVENOUS | Status: DC
  Administered 2022-02-08 – 2022-02-16 (×9): 40 mg via INTRAVENOUS
  Filled 2022-02-08 (×9): qty 10

## 2022-02-08 MED ORDER — ATROPINE SULFATE 1 MG/10ML IJ SOSY
PREFILLED_SYRINGE | INTRAMUSCULAR | Status: AC
  Filled 2022-02-08: qty 20

## 2022-02-08 MED ORDER — SODIUM CHLORIDE 0.9 % IV SOLN
0.0530 mg/kg/h | INTRAVENOUS | Status: DC
  Administered 2022-02-08: 0.03 mg/kg/h via INTRAVENOUS
  Filled 2022-02-08: qty 250

## 2022-02-08 MED ORDER — HYDRALAZINE HCL 20 MG/ML IJ SOLN
10.0000 mg | INTRAMUSCULAR | Status: DC | PRN
  Administered 2022-02-08: 10 mg via INTRAVENOUS
  Filled 2022-02-08: qty 2

## 2022-02-08 NOTE — Progress Notes (Signed)
Wheezing improved, vent rate increased to RR 16. EEP 11 with set PEEP 10, Pplat remains at goal.   LA 0.7 H/H 7.4/23.2  Minimal fibrin in circuit.  Continued high flows on circuit, sweep 4L  I/O +2L today, +3.3L this admission  Plan: K+ repletion Small dose of lasix tonight Type and screen UTD from 2/5; doesn't need blood unless hypotensive or chugging Con't to minimize albuterol; con't long-acting nebs and atrovent Transition to basal bolus insulin today.  Mom updated at bedside again this evening.  Julian Hy, DO 02/08/22 6:30 PM [facility]

## 2022-02-08 NOTE — Progress Notes (Signed)
ANTICOAGULATION CONSULT NOTE - Initial Consult  Pharmacy Consult for Bivalirudin Indication: VV Ecmo  Allergies  Allergen Reactions   Shellfish Allergy Swelling    Throat, facial swelling   Tomato Itching and Other (See Comments)    Eczema flares    Patient Measurements: Height: 5' (152.4 cm) Weight: 71.1 kg (156 lb 12 oz) IBW/kg (Calculated) : 45.5   Vital Signs: Temp: 97.7 F (36.5 C) (02/05 1943) Temp Source: Axillary (02/05 1943) BP: 126/36 (02/05 2300) Pulse Rate: 142 (02/06 0048)  Labs: Recent Labs    02/07/22 1516 02/07/22 1904 02/07/22 2130 02/07/22 2259  HGB 12.7 14.6 12.9 12.6  HCT 38.7 43.0 38.0 37.0  PLT 283  --   --   --   CREATININE 0.67  --   --   --     Estimated Creatinine Clearance: 94.5 mL/min (by C-G formula based on SCr of 0.67 mg/dL).   Medical History: Past Medical History:  Diagnosis Date   Asthma     Medications:  Medications Prior to Admission  Medication Sig Dispense Refill Last Dose   albuterol (VENTOLIN HFA) 108 (90 Base) MCG/ACT inhaler Inhale 1-2 puffs into the lungs every 6 (six) hours as needed for wheezing or shortness of breath.   02/07/2022   ALBUTEROL IN Inhale 1 vial into the lungs every 6 (six) hours as needed (wheezing, shortness of breath).   UNK   triamcinolone ointment (KENALOG) 0.1 % Apply 1 Application topically daily as needed (eczema flares).   UNK    Assessment: 26yF with history of asthma remained severely hypercarbic despite maximum intervention. The decision was made to initiate VV ECMO overnight. Hgb 9.5 at the time of intervention. PLT WNL. Pharmacy consulted to start bivalirudin.    Goal of Therapy:  aPTT 50-80 seconds Monitor platelets by anticoagulation protocol: Yes   Plan:  Start Bivalirudin 0.03 mg/kg/hr aPTT/CBC in 4 hours Monitor for s/sx of bleeding  Georga Bora, PharmD Clinical Pharmacist 02/08/2022 1:26 AM Please check AMION for all [place] numbers

## 2022-02-08 NOTE — Progress Notes (Signed)
RT and RN transported vent patient to cath lab and post procedure to [place] room 23.RT monitored patient in cath lab for 1.45 hours.

## 2022-02-08 NOTE — Progress Notes (Signed)
ANTICOAGULATION CONSULT NOTE - Initial Consult  Pharmacy Consult for Bivalirudin Indication: VV Ecmo  Allergies  Allergen Reactions   Shellfish Allergy Swelling    Throat, facial swelling   Tomato Itching and Other (See Comments)    Eczema flares    Patient Measurements: Height: 5' (152.4 cm) Weight: 75.9 kg (167 lb 5.3 oz) IBW/kg (Calculated) : 45.5   Vital Signs: Temp: 97.5 F (36.4 C) (02/06 0400) Temp Source: Core (02/06 0400) BP: 126/36 (02/05 2300) Pulse Rate: 121 (02/06 0645)  Labs: Recent Labs    02/07/22 1516 02/07/22 1904 02/08/22 0129 02/08/22 0133 02/08/22 0307 02/08/22 0313 02/08/22 0405 02/08/22 0546 02/08/22 0608  HGB 12.7   < > 9.0*   < >  --  9.0* 10.2* 10.2*  --   HCT 38.7   < > 28.5*   < >  --  28.2* 30.0* 30.0*  --   PLT 283  --  200  --   --  225  --   --   --   APTT  --   --  197*  --   --  100*  --   --  43*  LABPROT  --   --  17.0*  --   --  16.6*  --   --   --   INR  --   --  1.4*  --   --  1.4*  --   --   --   CREATININE 0.67  --   --   --  1.02*  --   --   --   --    < > = values in this interval not displayed.     Estimated Creatinine Clearance: 76.8 mL/min (A) (by C-G formula based on SCr of 1.02 mg/dL (H)).   Medical History: Past Medical History:  Diagnosis Date   Asthma     Medications:  Medications Prior to Admission  Medication Sig Dispense Refill Last Dose   albuterol (VENTOLIN HFA) 108 (90 Base) MCG/ACT inhaler Inhale 1-2 puffs into the lungs every 6 (six) hours as needed for wheezing or shortness of breath.   02/07/2022   ALBUTEROL IN Inhale 1 vial into the lungs every 6 (six) hours as needed (wheezing, shortness of breath).   UNK   triamcinolone ointment (KENALOG) 0.1 % Apply 1 Application topically daily as needed (eczema flares).   UNK    Assessment: 26yF with history of asthma remained severely hypercarbic despite maximum intervention. The decision was made to initiate VV ECMO overnight. Hgb 9.5 at the time of  intervention. PLT WNL. Pharmacy consulted to start bivalirudin.   PTT came back at 43 at the 4 hr mark. Early PTT was due to the effect of heparin bolus dose. No bleeding per Rn  Goal of Therapy:  aPTT 50-80 seconds Monitor platelets by anticoagulation protocol: Yes   Plan:  Increase Bivalirudin 0.036 mg/kg/hr aPTT in 4 hours Monitor for s/sx of bleeding  Onnie Boer, PharmD, BCIDP, AAHIVP, CPP Infectious Disease Pharmacist 02/08/2022 7:30 AM

## 2022-02-08 NOTE — Progress Notes (Signed)
Per chest xray PICC is coiled back on itself . This was a difficult PICC placement and the PICC nurse was not able to get the PICC to pass central due to other lines that are already in the central venous system. We will remove the PICC due to the malposition. Primary RN made aware and will notify MD.

## 2022-02-08 NOTE — Progress Notes (Signed)
OT Cancellation Note  Patient Details Name: Kaitlyn Mcneil MRN: [id number] DOB: Jun 18, 1995   Cancelled Treatment:    Reason Eval/Treat Not Completed: Medical issues which prohibited therapy.  Initiation of ECMO and RN reports not yet ready for OT.  Please consult again when appropriate.    Darleny Sem D Deboraha Goar 02/08/2022, 10:00 AM 02/08/2022  RP, OTR/L  Acute Rehabilitation Services  Office:  [phone]

## 2022-02-08 NOTE — Progress Notes (Signed)
Initial Nutrition Assessment  DOCUMENTATION CODES:   Not applicable  INTERVENTION:   Consider Cortrak placement  Tube Feeding Recommendations: recommend initiation of trickle TF within 24-48 hours of admission or as soon as medically able TF goal: Vital 1.5 at 55 ml/hr with Pro-source TF20 60 mL BID Start at 20 ml/hr upon initiation TF provides 129 g of protein, 2140 kcals and 1003 mL of free water  Pt currently receiving lipid calories via propofol infusion  NUTRITION DIAGNOSIS:   Inadequate oral intake related to acute illness as evidenced by NPO status.  GOAL:   Patient will meet greater than or equal to 90% of their needs  MONITOR:   Vent status, Labs, Weight trends  REASON FOR ASSESSMENT:   Consult, Ventilator Enteral/tube feeding initiation and management (ECMO)  ASSESSMENT:   27 yo female admitted with acute respiratory failure due to severe asthma exacerbation requiring intubation with deep sedation, paralyzed and cannulated for VV ECMO. PMH includes asthma, AR, eczema  2/5 Admitted, Intubated, deep sedation and nimbex, VV ECMO cannulation  Pt remains sedated on vent support, paralyzed on NMB, VV ECMO  OG tube in stomach Propofol: 24.5 ml/hr Off Levo  Unable to obtain diet and weight history  Discussed nutrition poc with Dr. Carlis Abbott. RN reporting that meds are coming back out of OG after clamping for 1 hour post administration. Pt very likely has delayed emptying that this time. Plan to reassess for initiation of TF tomorrow, possible Cortrak  Admit weight around 71 kg, weight up to 76 kg this AM  Labs: reviewed Meds: reviewed  NUTRITION - FOCUSED PHYSICAL EXAM:  Flowsheet Row Most Recent Value  Orbital Region No depletion  Upper Arm Region Unable to assess  Thoracic and Lumbar Region No depletion  Buccal Region Unable to assess  Temple Region No depletion  Clavicle Bone Region No depletion  Clavicle and Acromion Bone Region No depletion  Scapular  Bone Region No depletion  Dorsal Hand Unable to assess  Patellar Region Unable to assess  Anterior Thigh Region Unable to assess  Posterior Calf Region Unable to assess  Edema (RD Assessment) Moderate       Diet Order:   Diet Order             Diet NPO time specified  Diet effective now                   EDUCATION NEEDS:   Not appropriate for education at this time  Skin:  Skin Assessment: Reviewed RN Assessment  Last BM:  2/5  Height:   Ht Readings from Last 1 Encounters:  02/07/22 5' (1.524 m)    Weight:   Wt Readings from Last 1 Encounters:  02/08/22 75.9 kg    Ideal Body Weight:     BMI:  Body mass index is 32.68 kg/m.  Estimated Nutritional Needs:   Kcal:  2000-2200 kcals  Protein:  115-135 g  Fluid:  >/= 2L (limit volume from TF at this time)   Kerman Passey MS, RDN, LDN, CNSC Registered Dietitian 3 Clinical Nutrition RD Pager and On-Call Pager Number Located in [place]

## 2022-02-08 NOTE — CV Procedure (Signed)
    TRANSESOPHAGEAL ECHOCARDIOGRAM   NAME:  Kaitlyn Mcneil   MRN: [id number] DOB:  04-30-1995   ADMIT DATE: 02/07/2022  INDICATIONS:  ECMO placement  PROCEDURE:   Patient was already sedated on vent. A timeout was taken. The transesophageal probe was inserted in the esophagus and stomach without difficulty and multiple views were obtained.    COMPLICATIONS:    There were no immediate complications.  FINDINGS:  LEFT VENTRICLE: EF = 65-70%. No regional wall motion abnormalities.  RIGHT VENTRICLE: Normal size and function.   LEFT ATRIUM: Normal  LEFT ATRIAL APPENDAGE: Not well visualized  RIGHT ATRIUM: Normal ECMO cannula well positioned with return flow well directed into the RA  AORTIC VALVE:  Trileaflet. No AI/AS  MITRAL VALVE:    Normal. No MR  TRICUSPID VALVE: Normal. Mild TR  PULMONIC VALVE: Grossly normal.  INTERATRIAL SEPTUM: No PFO or ASD.  PERICARDIUM: No effusion  DESCENDING AORTA: Not visualized    Quillian Quince [name],MD 2:07 PM

## 2022-02-08 NOTE — Progress Notes (Signed)
PT Cancellation Note  Patient Details Name: Kaitlyn Mcneil MRN: [id number] DOB: 1995/02/27   Cancelled Treatment:    Reason Eval/Treat Not Completed: Patient not medically ready; noted initiation of ECMO and RN reports not yet ready for PT.  Please consult again when appropriate.  PT will sign off.  Thanks!   Reginia Naas 02/08/2022, 9:55 AM Magda Kiel, PT Acute Rehabilitation Services Office:[phone] 02/08/2022

## 2022-02-08 NOTE — Progress Notes (Signed)
ANTICOAGULATION CONSULT NOTE  Pharmacy Consult for Bivalirudin Indication: VV Ecmo  Allergies  Allergen Reactions   Shellfish Allergy Swelling    Throat, facial swelling   Tomato Itching and Other (See Comments)    Eczema flares    Patient Measurements: Height: 5' (152.4 cm) Weight: 75.9 kg (167 lb 5.3 oz) IBW/kg (Calculated) : 45.5   Vital Signs: Temp: 97 F (36.1 C) (02/06 2200) Temp Source: Esophageal (02/06 2000) BP: 144/64 (02/06 1148) Pulse Rate: 122 (02/06 2200)  Labs: Recent Labs    02/07/22 1516 02/07/22 1904 02/08/22 0129 02/08/22 0133 02/08/22 0307 02/08/22 0313 02/08/22 0405 02/08/22 1200 02/08/22 1203 02/08/22 1702 02/08/22 1704 02/08/22 2008 02/08/22 2108 02/08/22 2153  HGB 12.7   < > 9.0*   < >  --  9.0*   < >  --    < > 7.4*  --  8.2* 8.2*  --   HCT 38.7   < > 28.5*   < >  --  28.2*   < >  --    < > 23.2*  --  24.0* 24.0*  --   PLT 283  --  200  --   --  225  --   --   --  172  --   --   --   --   APTT  --    < > 197*  --   --  100*   < > 48*  --   --  52*  --   --  49*  LABPROT  --   --  17.0*  --   --  16.6*  --   --   --   --   --   --   --   --   INR  --   --  1.4*  --   --  1.4*  --   --   --   --   --   --   --   --   CREATININE 0.67  --   --   --  1.08*  1.02*  --   --   --   --  0.63  --   --   --   --    < > = values in this interval not displayed.     Estimated Creatinine Clearance: 97.9 mL/min (by C-G formula based on SCr of 0.63 mg/dL).   Medical History: Past Medical History:  Diagnosis Date   Asthma     Medications:  Medications Prior to Admission  Medication Sig Dispense Refill Last Dose   albuterol (VENTOLIN HFA) 108 (90 Base) MCG/ACT inhaler Inhale 1-2 puffs into the lungs every 6 (six) hours as needed for wheezing or shortness of breath.   02/07/2022   ALBUTEROL IN Inhale 1 vial into the lungs every 6 (six) hours as needed (wheezing, shortness of breath).   UNK   triamcinolone ointment (KENALOG) 0.1 % Apply 1  Application topically daily as needed (eczema flares).   UNK    Assessment:  26yF with history of asthma remained severely hypercarbic despite maximum intervention. The decision was made to initiate VV ECMO overnight. Pharmacy dosing bivalirudin.  aPTT subtherapeutic (49) while on bivalirudin 0.04 mg/kg/hr. No concerns for bleeding.  Goal of Therapy:  aPTT 50-80 seconds Monitor platelets by anticoagulation protocol: Yes   Plan:  Increase bivalirudin to 0.048mg /kg/h Recheck aPTT in 4h  Erskine Speed, PharmD Clinical Pharmacist 02/08/2022

## 2022-02-08 NOTE — Progress Notes (Signed)
Pharmacy Antibiotic Note  Kaitlyn Mcneil is a 27 y.o. female admitted on 02/07/2022 with asthma exacerbation now s/p VV-ECMO cannulation. Pharmacy has been consulted for vancomycin and meropenem dosing.  Plan: -Meropenem 1g IV q8h -Vancomycin 2000mg  IV x1 then 1000mg  q12h -Follow Cr, LOT -Vancomycin levels as Css  Height: 5' (152.4 cm) Weight: 75.9 kg (167 lb 5.3 oz) IBW/kg (Calculated) : 45.5  Temp (24hrs), Avg:97.8 F (36.6 C), Min:96.4 F (35.8 C), Max:99 F (37.2 C)  Recent Labs  Lab 02/07/22 1516 02/07/22 1843 02/07/22 2124 02/08/22 0129 02/08/22 0131 02/08/22 0307 02/08/22 0313 02/08/22 0610  WBC 16.7*  --   --  25.3*  --   --  29.3*  --   CREATININE 0.67  --   --   --   --  1.02*  --   --   LATICACIDVEN  --  1.1 5.5*  --  >9.0*  --  >9.0* >9.0*    Estimated Creatinine Clearance: 76.8 mL/min (A) (by C-G formula based on SCr of 1.02 mg/dL (H)).    Allergies  Allergen Reactions   Shellfish Allergy Swelling    Throat, facial swelling   Tomato Itching and Other (See Comments)    Eczema flares    Antimicrobials this admission: Vancomycin 2/6 >>  Meropenem 2/6 >>   Dose adjustments this admission: none  Microbiology results: pending  Thank you for allowing pharmacy to be a part of this patient's care.  Arrie Senate, PharmD, BCPS, [facility] Clinical Pharmacist [phone] Please check AMION for all [place] numbers 02/08/2022

## 2022-02-08 NOTE — CV Procedure (Signed)
     TRANSESOPHAGEAL ECHOCARDIOGRAM   NAME:  Kaitlyn Mcneil   MRN: [id number] DOB:  08-18-1995   ADMIT DATE: 02/07/2022  INDICATIONS:  ECMO cannula placement  PROCEDURE:   The patient was intubated/sedated on cath lab table for ECMO cannulation.   Once ECMO cannula was placed the TEE probe was placed without difficulty    COMPLICATIONS:    There were no immediate complications.  FINDINGS:  LEFT VENTRICLE: EF = 70-65%. No regional wall motion abnormalities.  RIGHT VENTRICLE: Normal size and function.   LEFT ATRIUM: Normal  LEFT ATRIAL APPENDAGE: Not well visualized  RIGHT ATRIUM: Normal. Good ECMO flow into R atrium  AORTIC VALVE:  Trileaflet. No significant AI  MITRAL VALVE:    Normal.  TRICUSPID VALVE: Normal. Trivial TR  PULMONIC VALVE: Grossly normal.  INTERATRIAL SEPTUM: No PFO or ASD.  PERICARDIUM: No effusion  DESCENDING AORTA: Not well visualized   Liesa Tsan,MD 1:20 AM

## 2022-02-08 NOTE — Progress Notes (Signed)
Peripherally Inserted Central Catheter Placement  The IV Nurse has discussed with the patient and/or persons authorized to consent for the patient, the purpose of this procedure and the potential benefits and risks involved with this procedure.  The benefits include less needle sticks, lab draws from the catheter, and the patient may be discharged home with the catheter. Risks include, but not limited to, infection, bleeding, blood clot (thrombus formation), and puncture of an artery; nerve damage and irregular heartbeat and possibility to perform a PICC exchange if needed/ordered by physician.  Alternatives to this procedure were also discussed.  Bard Power PICC patient education guide, fact sheet on infection prevention and patient information card has been provided to patient /or left at bedside.    PICC Placement Documentation  PICC Triple Lumen 25/95/63 Left Basilic 41 cm 0 cm (Active)  Indication for Insertion or Continuance of Line Vasoactive infusions 02/08/22 1432  Exposed Catheter (cm) 0 cm 02/08/22 1432  Site Assessment Clean, Dry, Intact 02/08/22 1432  Lumen #1 Status Flushed;Blood return noted;Saline locked 02/08/22 1432  Lumen #2 Status Flushed;Blood return noted;Saline locked 02/08/22 1432  Lumen #3 Status Flushed;Blood return noted;Saline locked 02/08/22 1432  Dressing Type Transparent 02/08/22 1432  Dressing Status Antimicrobial disc in place 02/08/22 1432  Safety Lock Not Applicable 87/56/43 3295  Line Care Connections checked and tightened 02/08/22 1432  Line Adjustment (NICU/IV Team Only) No 02/08/22 1432  Dressing Intervention New dressing 02/08/22 1432  Dressing Change Due 02/15/22 02/08/22 1432    Mother gave  consent for PICC via phone and witnessed by another PICC RN .  Valentina Shaggy Ramos 02/08/2022, 2:36 PM

## 2022-02-08 NOTE — TOC Initial Note (Signed)
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 She currently does not have insurance. Will send referral to Financial counselor for Medicaid/CAFA screening. Will use Match/HF fund for medication. Will set up appt for follow up with PCP closer to dc.   Expected Discharge Plan: Home/Self Care Barriers to Discharge: Continued Medical Work up   Patient Goals and CMS Choice Patient states their goals for this hospitalization and ongoing recovery are:: wants pt to recover CMS Medicare.gov Compare Post Acute Care list provided to:: Patient Represenative (must comment) (mother)        Expected Discharge Plan and Services   Discharge Planning Services: CM Consult   Living arrangements for the past 2 months: Apartment                                      Prior Living Arrangements/Services Living arrangements for the past 2 months: Apartment Lives with:: Self                   Activities of Daily Living      Permission Sought/Granted Permission sought to share information with : Case Manager, Family Supports    Share Information with NAME: Rajean Desantiago     Permission granted to share info w Relationship: mother  Permission granted to share info w Contact Information: [phone]  Emotional Assessment   Attitude/Demeanor/Rapport: Intubated (Following Commands or Not Following Commands)       Psych Involvement: No (comment)  Admission diagnosis:  Acute respiratory distress [R06.03] Respiratory failure, unspecified chronicity, unspecified whether with hypoxia or hypercapnia ([place]) [J96.90] Patient Active Problem List   Diagnosis Date Noted    Acute respiratory distress 02/07/2022   Severe asthma with acute exacerbation 02/07/2022   PCP:  Patient, No Pcp Per Pharmacy:   [facility] ([place]), [place] - [address] [place]) [place] [address] Phone: [phone] Fax: [phone]     Social Determinants of Health (SDOH) Social History: SDOH Screenings   Tobacco Use: Low Risk  (02/08/2022)   SDOH Interventions:     Readmission Risk Interventions     No data to display

## 2022-02-08 NOTE — Progress Notes (Signed)
  ECMO INITIATION   Patient: Kaitlyn Mcneil, 12/20/1995, 27 y.o. Location:   Date of Service:  02/08/2022     Time: 1:26 AM  Date of Admission: 02/07/2022 Admitting diagnosis: Acute respiratory distress  Ht: 5' (152.4 cm) Wt: 71.1 kg BSA: Body surface area is 1.73 meters squared.  Blood Type: O POS Allergies:  Allergies  Allergen Reactions   Shellfish Allergy Swelling    Throat, facial swelling   Tomato Itching and Other (See Comments)    Eczema flares    Past medical history:  Past Medical History:  Diagnosis Date   Asthma    Past surgical history: History reviewed. No pertinent surgical history.  Indication for ECMO: Other  ECMO was deployed at 2200 and initiated at 0011  Anticoagulation achieved with Heparin bolus of 4000 units given to patient at [place]. Cannulated for VV  and achieved initial ECMO 3.54 Lpm  and ECMO 2 of sweep .    ECMO Cannula Information     Staff Present  Primary Perfusionist Pat Patrick  Assisting Perfusionist/ECMO Specialist Idelia Salm RN and Carrington Clamp RRT  Cannulating Physician Pierre Bali   ECMO Lot Numbers  [place]  [id number]  [place]  [id number]  [place]  [id number]  ECMO Goals  Flow goal    3.5-4 Lpm  Anticoagulation goal    N/A  Cardiac goal    MAP > 65  Respiratory goal    Optimize oxygenation  Other goal       ECMO Handoff  Patient Information * Age Height Weight BSA IBW BMI  27 y.o. 5' (152.4 cm)  (71.1 kg Body surface area is 1.73 meters squared. No data recorded Body mass index is 30.61 kg/m.   Review History * Primary Diagnosis   Acute respiratory distress  Prior Cardiac Arrest within 24hrs of ECMO initiation? No  ECMO and MCS * Type ECMO Flow ECMO Sweep Gases    VV   3.5    2      Additional Mechanical Support   Ventilation *    $ Ventilator Initial/Subsequent : Subsequent, Vent Mode: (S) PCV, Vt Set: 400 mL, Set Rate: (S) 12 bmp, FiO2 (%): (S) 40 %, I Time: 0.75 Sec(s), PEEP:  (S) 10 cmH20     Cannula Size and Locations     30 F [place]  Drainage    Return     *Cannula(e) sutured and anchored, secured and dressed.   Labs and Imaging *  *Cannulation position verified via imaging on arrival to ICU. Concerns communicated to attending surgeon. Labs reviewed.   All ECMO safety checks complete. ECMO flowsheet initiated, applicable charges captured, LDA's entered/confirmed, imaging and labs verified, blood products available, and report given to Erma Heritage ECMO specialist.

## 2022-02-08 NOTE — Progress Notes (Signed)
Elink notified of sudden decrease in SBP from 170 to 110s. Pt current on Fentanyl @ 200, Prop @ 80, and Nimbex @ 3. E-link was notified. This RN was told to continue to monitor.   At around 2100, SBP dropped below 100. E-link RN notified and recommended to continue monitoring.   At 2130, SBP further decreased to 80s. Bicarb gtt changes ordered. CCM arrived at beside to evaluate patient status. NS bolus + levo gtt was started.  Patient transported to Cath lab and went on ECMO around 2300.

## 2022-02-08 NOTE — Progress Notes (Signed)
  Echocardiogram Echocardiogram Transesophageal has been performed.  Kaitlyn Mcneil 02/08/2022, 1:07 AM

## 2022-02-08 NOTE — Progress Notes (Signed)
ANTICOAGULATION CONSULT NOTE  Pharmacy Consult for Bivalirudin Indication: VV Ecmo  Allergies  Allergen Reactions   Shellfish Allergy Swelling    Throat, facial swelling   Tomato Itching and Other (See Comments)    Eczema flares    Patient Measurements: Height: 5' (152.4 cm) Weight: 75.9 kg (167 lb 5.3 oz) IBW/kg (Calculated) : 45.5   Vital Signs: Temp: 97.2 F (36.2 C) (02/06 1730) Temp Source: Esophageal (02/06 1600) BP: 144/64 (02/06 1148) Pulse Rate: 95 (02/06 1730)  Labs: Recent Labs    02/07/22 1516 02/07/22 1904 02/08/22 0129 02/08/22 0133 02/08/22 0307 02/08/22 0313 02/08/22 0405 02/08/22 0608 02/08/22 0900 02/08/22 1200 02/08/22 1203 02/08/22 1610 02/08/22 1702 02/08/22 1704  HGB 12.7   < > 9.0*   < >  --  9.0*   < >  --    < >  --  7.8* 7.8* 7.4*  --   HCT 38.7   < > 28.5*   < >  --  28.2*   < >  --    < >  --  23.0* 23.0* 23.2*  --   PLT 283  --  200  --   --  225  --   --   --   --   --   --  172  --   APTT  --    < > 197*  --   --  100*  --  43*  --  48*  --   --   --  52*  LABPROT  --   --  17.0*  --   --  16.6*  --   --   --   --   --   --   --   --   INR  --   --  1.4*  --   --  1.4*  --   --   --   --   --   --   --   --   CREATININE 0.67  --   --   --  1.08*  1.02*  --   --   --   --   --   --   --   --   --    < > = values in this interval not displayed.     Estimated Creatinine Clearance: 72.5 mL/min (A) (by C-G formula based on SCr of 1.08 mg/dL (H)).   Medical History: Past Medical History:  Diagnosis Date   Asthma     Medications:  Medications Prior to Admission  Medication Sig Dispense Refill Last Dose   albuterol (VENTOLIN HFA) 108 (90 Base) MCG/ACT inhaler Inhale 1-2 puffs into the lungs every 6 (six) hours as needed for wheezing or shortness of breath.   02/07/2022   ALBUTEROL IN Inhale 1 vial into the lungs every 6 (six) hours as needed (wheezing, shortness of breath).   UNK   triamcinolone ointment (KENALOG) 0.1 % Apply 1  Application topically daily as needed (eczema flares).   UNK    Assessment:  26yF with history of asthma remained severely hypercarbic despite maximum intervention. The decision was made to initiate VV ECMO overnight. Pharmacy dosing bivalirudin.  aPTT therapeutic at 52 while on bival 0.04 mg/kg/hr. No concerns for bleeding.   Goal of Therapy:  aPTT 50-80 seconds Monitor platelets by anticoagulation protocol: Yes   Plan:  Continue bivalirudin to 0.04mg /kg/h Recheck aPTT in 4h  Erskine Speed, PharmD Clinical Pharmacist 02/08/2022

## 2022-02-08 NOTE — Transfer of Care (Signed)
Immediate Anesthesia Transfer of Care Note  Patient: Kaitlyn Mcneil  Procedure(s) Performed: CANNULATION FOR ECMO (EXTRACORPOREAL MEMBRANE OXYGENATION)  Patient Location: ICU  Anesthesia Type:General  Level of Consciousness: Patient remains intubated per anesthesia plan  Airway & Oxygen Therapy: Patient remains intubated per anesthesia plan and Patient placed on Ventilator (see vital sign flow sheet for setting)  Post-op Assessment: Report given to RN and Post -op Vital signs reviewed and stable  Post vital signs: Reviewed and stable  Last Vitals:  Vitals Value Taken Time  BP 128/45 02/08/22 0124  Temp    Pulse 139 02/08/22 0124  Resp 12 02/08/22 0124  SpO2 100 % 02/08/22 0124  Vitals shown include unvalidated device data.  Last Pain:  Vitals:   02/07/22 1943  TempSrc: Axillary  PainSc:          Complications: No notable events documented.

## 2022-02-08 NOTE — CV Procedure (Signed)
ECMO NOTE:   Indication: Respiratory failure due to status asthmaticus   Initial cannulation date: 02/08/22   ECMO type: VV ECMO   Dual lumen inflow/return cannula:   1) 3F [place] placed RIJ   ECMO events:   - Initial cannulation 02/08/22 @ 12am     Daily data:   Flow 3.5L RPM 2825 Sweep  5L   Labs:   ABG    Component Value Date/Time   PHART 7.030 (LL) 02/08/2022 0017   PCO2ART 86.5 (HH) 02/08/2022 0017   PO2ART 394 (H) 02/08/2022 0017   HCO3 22.9 02/08/2022 0017   TCO2 25 02/08/2022 0017   ACIDBASEDEF 8.0 (H) 02/08/2022 0017   O2SAT 100 02/08/2022 0017    Hgb pending Platelets pending LDH pending  PTT on bival  Lactic acid 5.5 ->    Plan:  Continue ECMO support with lung protective vent settings. Hopefully will be short run     Discussed in multidisciplinary fashion on ECMO rounds with CCM, Cardiology, ECMO coordinator/specialist, RT, PharmD and nursing staff all present.  Glori Bickers, MD  1:27 AM

## 2022-02-08 NOTE — Progress Notes (Signed)
Advanced Heart Failure Rounding Note  PCP-Cardiologist: None   Subjective:    Remains intubated/sedated/paralyzed.   Flows up overnight due to low PaO2  Now flowing @ 4.2-4.3  NE off. MAPS 61s. Remains on bicarb gtt  7.35/37/73/94% Sweep 5  Hgb 9.0   Lactate persistently > 9   Objective:   Weight Range: 75.9 kg Body mass index is 32.68 kg/m.   Vital Signs:   Temp:  [96.4 F (35.8 C)-99 F (37.2 C)] 97.5 F (36.4 C) (02/06 0400) Pulse Rate:  [109-145] 122 (02/06 0751) Resp:  [0-39] 12 (02/06 0751) BP: (65-254)/(32-190) 126/36 (02/05 2300) SpO2:  [78 %-100 %] 99 % (02/06 0832) Arterial Line BP: (77-318)/(40-79) 123/52 (02/06 0745) FiO2 (%):  [40 %-100 %] 40 % (02/06 0832) Weight:  [68 kg-75.9 kg] 75.9 kg (02/06 0515) Last BM Date :  (PTA)  Weight change: Filed Weights   02/07/22 1434 02/07/22 1800 02/08/22 0515  Weight: 68 kg 71.1 kg 75.9 kg    Intake/Output:   Intake/Output Summary (Last 24 hours) at 02/08/2022 0840 Last data filed at 02/08/2022 0800 Gross per 24 hour  Intake 4010.31 ml  Output 2500 ml  Net 1510.31 ml      Physical Exam    General:  Intubated/sedated HEENT: Normal + ETT Neck: Supple. RIJ ECMO cannula Carotids 2+ bilat; no bruits. No lymphadenopathy or thyromegaly appreciated. Cor: PMI nondisplaced. Regular rate & rhythm. No rubs, gallops or murmurs. Lungs: Clear Abdomen: Soft, nontender, nondistended. No hepatosplenomegaly. No bruits or masses. Good bowel sounds. Extremities: No cyanosis, clubbing, rash, edema Neuro: Alert & orientedx3, cranial nerves grossly intact. moves all 4 extremities w/o difficulty. Affect pleasant   Telemetry   Sinus tach 120-130 Personally reviewed    Labs    CBC Recent Labs    02/07/22 1516 02/07/22 1904 02/08/22 0129 02/08/22 0133 02/08/22 0313 02/08/22 0405 02/08/22 0546  WBC 16.7*  --  25.3*  --  29.3*  --   --   NEUTROABS 13.9*  --   --   --   --   --   --   HGB 12.7   < > 9.0*   <  > 9.0* 10.2* 10.2*  HCT 38.7   < > 28.5*   < > 28.2* 30.0* 30.0*  MCV 83.0  --  87.4  --  86.2  --   --   PLT 283  --  200  --  225  --   --    < > = values in this interval not displayed.   Basic Metabolic Panel Recent Labs    02/07/22 1516 02/07/22 1904 02/08/22 0307 02/08/22 0405 02/08/22 0546  NA 135   < > 140 142 142  K 3.1*   < > 2.8* 2.7* 2.9*  CL 101  --  101  --   --   CO2 22  --  18*  --   --   GLUCOSE 96  --  327*  --   --   BUN 6  --  6  --   --   CREATININE 0.67  --  1.02*  --   --   CALCIUM 9.2  --  7.0*  --   --   MG  --   --  2.4  --   --   PHOS  --   --  1.5*  --   --    < > = values in this interval not displayed.   Liver  Function Tests Recent Labs    02/08/22 0307  AST 43*  ALT 17  ALKPHOS 49  BILITOT 0.3  PROT 5.6*  ALBUMIN 3.2*   No results for input(s): "LIPASE", "AMYLASE" in the last 72 hours. Cardiac Enzymes No results for input(s): "CKTOTAL", "CKMB", "CKMBINDEX", "TROPONINI" in the last 72 hours.  BNP: BNP (last 3 results) No results for input(s): "BNP" in the last 8760 hours.  ProBNP (last 3 results) No results for input(s): "PROBNP" in the last 8760 hours.   D-Dimer No results for input(s): "DDIMER" in the last 72 hours. Hemoglobin A1C Recent Labs    02/08/22 0307  HGBA1C 5.9*   Fasting Lipid Panel Recent Labs    02/08/22 0307  TRIG 85   Thyroid Function Tests No results for input(s): "TSH", "T4TOTAL", "T3FREE", "THYROIDAB" in the last 72 hours.  Invalid input(s): "FREET3"  Other results:   Imaging    DG Chest Port 1 View  Result Date: 02/08/2022 CLINICAL DATA:  27 year old female with status asthmatic a status post intubation, ECMO. EXAM: PORTABLE CHEST 1 VIEW COMPARISON:  Portable chest 0232 hours today and earlier. FINDINGS: Portable AP supine view at 0440 hours. Stable, satisfactory lines and tubes. Stable lung volumes and mediastinal contours. Streaky and confluent right lung base opacity, mild streaky left  retrocardiac opacity. Ventilation is stable. No superimposed pneumothorax or pulmonary edema. No osseous abnormality identified. IMPRESSION: 1. Stable, satisfactory lines and tubes. 2. Stable ventilation since 0232 hours today with right > left lung base opacity. Electronically Signed   By: Genevie Ann M.D.   On: 02/08/2022 05:27   DG Abd 1 View  Result Date: 02/08/2022 CLINICAL DATA:  27 year old female oral enteric tube placement. Status asthmaticus. ECMO. EXAM: ABDOMEN - 1 VIEW COMPARISON:  0234 hours today and earlier. FINDINGS: Portable AP supine view at 0438 hours. Enteric tube placed into the stomach, side hole at the level of the gastric fundus. Paucity of bowel gas in the visible abdomen. Superimposed right side chest and upper abdomen ECMO cannula redemonstrated. Patchy right lung base opacity does not appear significantly changed. Partially visible endotracheal tube and left IJ central line also. Visible mediastinal contours appear stable. No acute osseous abnormality identified. IMPRESSION: 1. Enteric tube placed into the stomach, side hole at the level of the gastric fundus. Paucity of bowel gas. 2. Partially visible ECMO cannula, endotracheal tube and left IJ central line. Patchy right lung base opacity. Electronically Signed   By: Genevie Ann M.D.   On: 02/08/2022 05:25   DG Abd Portable 1V  Result Date: 02/08/2022 CLINICAL DATA:  [id number] with personal history of ECMO, [id number] with orogastric tube placement. EXAM: PORTABLE ABDOMEN - 1 VIEW COMPARISON:  Portable chest earlier today at 2:32 a.m. FINDINGS: 2:34 a.m. NGT extends into the inferior stomach then curves upward with the tip in the cardiofundal area along the medial hemidiaphragm. There is an ECMO catheter in place with its tip in the intrahepatic IVC at the level of T12-L1. The bowel pattern is nonobstructive with a general paucity of visible bowel gas. No nephrolithiasis is seen. There is patchy consolidation in the right lung base, as before.  IMPRESSION: 1. NGT extends into the inferior stomach then curves upward with the tip in the cardiofundal area along the medial hemidiaphragm. 2. ECMO catheter tip in the intrahepatic IVC at the level of T12-L1. 3. Right lung base airspace disease. 4. Paucity of visible bowel gas. Electronically Signed   By: Telford Nab M.D.   On: 02/08/2022  03:13   DG CHEST PORT 1 VIEW  Result Date: 02/08/2022 CLINICAL DATA:  OG tube placement EXAM: PORTABLE CHEST 1 VIEW COMPARISON:  02/07/2022 FINDINGS: ECMO device in place. Left central line has been repositioned with the tip in the right atrium. Endotracheal tube is unchanged. NG tube is in the stomach. Increasing right lower lobe and left upper lobe airspace disease. Heart is normal size. No effusions or pneumothorax. IMPRESSION: Support devices as above. Increasing right lower lobe and left lower lobe airspace disease. Electronically Signed   By: Charlett Nose M.D.   On: 02/08/2022 02:50   CARDIAC CATHETERIZATION  Result Date: 02/08/2022 Successful VV ECMO placement.   EP STUDY  Result Date: 02/08/2022 Successful VV ECMO placement.   DG Chest Port 1 View  Result Date: 02/07/2022 CLINICAL DATA:  [id number].  Encounter for central line placement. EXAM: PORTABLE CHEST 1 VIEW COMPARISON:  Portable chest earlier today at 10:15 p.m. FINDINGS: 10:58 p.m. There is a new left IJ central line crossing over to the right with tip in the right subclavian vein near the thoracic outlet. ETT tip is 3.7 cm from the carina. NGT extends down into the stomach then curves upward to the left with the tip pointing cephalad just beneath the mid left hemidiaphragm. Patchy consolidation in the right base along the hemidiaphragm is again noted extending from the infrahilar area, with similar left perihilar airspace disease or atelectasis. The remaining lungs are clear with no new or worsening opacity. The cardiomediastinal silhouette and vasculature are normal. No acute osseous abnormality.  IMPRESSION: 1. New left IJ central line with tip in the right subclavian vein near the thoracic outlet. 2. ETT and NGT in good position. 3. Stable right infrahilar and left perihilar airspace disease. No new or worsening opacity. Electronically Signed   By: Almira Bar M.D.   On: 02/07/2022 23:17   DG CHEST PORT 1 VIEW  Result Date: 02/07/2022 CLINICAL DATA:  [id number] Acute hypoxemic respiratory failure (HCC) [id number] EXAM: PORTABLE CHEST 1 VIEW COMPARISON:  02/07/2022 FINDINGS: Endotracheal tube is 3 cm above the carina. NG tube is in the stomach. Heart is normal size. Right basilar and left perihilar airspace disease, similar to prior study. No visible effusions or acute bony abnormality. IMPRESSION: Right basilar and left perihilar airspace opacities, stable. Support devices in expected position. Electronically Signed   By: Charlett Nose M.D.   On: 02/07/2022 22:29   DG Abd Portable 1V  Result Date: 02/07/2022 CLINICAL DATA:  Nasogastric tube placement. EXAM: PORTABLE ABDOMEN - 1 VIEW COMPARISON:  None Available. FINDINGS: Nasal/orogastric tube passes well below the diaphragm to curl within the mid stomach, tip projecting in the fundus. Hazy opacity noted at the right lung base, better visualized on the current portable chest radiograph. This may reflect atelectasis or infection. IMPRESSION: 1. Well-positioned nasal/orogastric tube. 2. Hazy right lung base opacity. Consider pneumonia in the proper clinical setting. Electronically Signed   By: Amie Portland M.D.   On: 02/07/2022 17:16   DG Chest Port 1 View  Result Date: 02/07/2022 CLINICAL DATA:  Endotracheal tube placement. Also reported central line placement. EXAM: PORTABLE CHEST 1 VIEW COMPARISON:  02/07/2022 at 3:52 p.m. FINDINGS: Endotracheal tube tip projects 1.8 cm above the carina. No visualized central line. Heart, mediastinum and hila are unremarkable. Clear lungs. IMPRESSION: 1. Well-positioned endotracheal tube. Electronically Signed   By:  Amie Portland M.D.   On: 02/07/2022 17:15   DG Chest Portable 1 View  Result Date: 02/07/2022 CLINICAL DATA:  Shortness of breath. EXAM: PORTABLE CHEST 1 VIEW COMPARISON:  Chest x-ray dated 02/22/2017. FINDINGS: Heart size and mediastinal contours are within normal limits. Mild prominence of the central perihilar bronchovascular markings bilaterally. Lungs are otherwise clear. No pleural effusion or pneumothorax is seen. Osseous structures about the chest are unremarkable. IMPRESSION: Mild prominence of the central perihilar bronchovascular markings bilaterally, compatible with viral bronchitis or reactive airway disease. No evidence of a consolidating pneumonia. Electronically Signed   By: Bary Richard M.D.   On: 02/07/2022 16:08     Medications:     Scheduled Medications:  arformoterol  15 mcg Nebulization BID   artificial tears  1 Application Both Eyes Q8H   atropine       budesonide (PULMICORT) nebulizer solution  0.5 mg Nebulization BID   calcium chloride       Chlorhexidine Gluconate Cloth  6 each Topical Daily   docusate  100 mg Per Tube BID   EPINEPHrine       famotidine  20 mg Per Tube BID   feeding supplement (PROSource TF20)  60 mL Per Tube Daily   feeding supplement (VITAL HIGH PROTEIN)  1,000 mL Per Tube Q24H   ipratropium-albuterol  3 mL Nebulization Q4H   loratadine  10 mg Per Tube Daily   methylPREDNISolone (SOLU-MEDROL) injection  80 mg Intravenous Q4H   montelukast  10 mg Per Tube QHS   mouth rinse  15 mL Mouth Rinse Q2H   pantoprazole (PROTONIX) IV  40 mg Intravenous QHS   polyethylene glycol  17 g Per Tube Daily   revefenacin  175 mcg Nebulization Daily   sodium bicarbonate        Infusions:  sodium chloride     sodium chloride     sodium chloride 75 mL/hr at 02/08/22 0800   bivalirudin (ANGIOMAX) 250 mg in sodium chloride 0.9 % 500 mL (0.5 mg/mL) infusion 0.036 mg/kg/hr (02/08/22 0800)   calcium gluconate     cisatracurium (NIMBEX) 200 mg in sodium chloride  0.9 % 100 mL (2 mg/mL) infusion 3 mcg/kg/min (02/08/22 0800)   fentaNYL infusion INTRAVENOUS 200 mcg/hr (02/08/22 0800)   insulin 6.5 Units/hr (02/08/22 0800)   meropenem (MERREM) IV 1 g (02/08/22 0814)   norepinephrine (LEVOPHED) Adult infusion Stopped (02/08/22 0755)   potassium chloride     potassium PHOSPHATE IVPB (in mmol) 85 mL/hr at 02/08/22 0800   propofol (DIPRIVAN) infusion 55 mcg/kg/min (02/08/22 0800)   sodium bicarbonate 150 mEq in sterile water 1,150 mL infusion 100 mL/hr at 02/08/22 0800   vancomycin      PRN Medications: Place/Maintain arterial line **AND** sodium chloride, atropine, calcium chloride, dextrose, docusate, EPINEPHrine, fentaNYL, ipratropium-albuterol, ondansetron (ZOFRAN) IV, mouth rinse, polyethylene glycol, sodium bicarbonate     Assessment/Plan   1. Acute hypoxic/hypercarbic respiratory failure in setting of status asthmaticus - cannulated for VV ECMO 02/08/22 - pCO2 improved. Vent adjusted by CCM - Continue nebs/steroids - Circuit flowing well. TEE today to reassess position - Continue bival. Discussed dosing with PharmD personally. - Suspect lactic acidosis due to albuterol. Trend - Transfuse for hgb < 7.5  2. Hypokalemia - supp  3. HTN - now controlled  CRITICAL CARE Performed by: Arvilla Meres  Total critical care time: 55 minutes  Critical care time was exclusive of separately billable procedures and treating other patients.  Critical care was necessary to treat or prevent imminent or life-threatening deterioration.  Critical care was time spent personally by me (independent of midlevel providers or residents) on the  following activities: development of treatment plan with patient and/or surrogate as well as nursing, discussions with consultants, evaluation of patient's response to treatment, examination of patient, obtaining history from patient or surrogate, ordering and performing treatments and interventions, ordering and review  of laboratory studies, ordering and review of radiographic studies, pulse oximetry and re-evaluation of patient's condition.   Length of Stay: 1  Glori Bickers, MD  02/08/2022, 8:40 AM  Advanced Heart Failure Team Pager [phone] (M-F; 7a - 5p)  Please contact [facility] for night-coverage after hours (5p -7a ) and weekends on [email or web address]

## 2022-02-08 NOTE — Plan of Care (Signed)
  Problem: Respiratory: Goal: Ability to maintain a clear airway and adequate ventilation will improve Outcome: Progressing   Problem: Clinical Measurements: Goal: Will remain free from infection Outcome: Progressing Goal: Diagnostic test results will improve Outcome: Progressing Goal: Respiratory complications will improve Outcome: Progressing Goal: Cardiovascular complication will be avoided Outcome: Progressing   Problem: Elimination: Goal: Will not experience complications related to urinary retention Outcome: Progressing   Problem: Skin Integrity: Goal: Risk for impaired skin integrity will decrease Outcome: Progressing   Problem: Fluid Volume: Goal: Ability to maintain a balanced intake and output will improve Outcome: Progressing   Problem: Metabolic: Goal: Ability to maintain appropriate glucose levels will improve Outcome: Progressing   Problem: Skin Integrity: Goal: Risk for impaired skin integrity will decrease Outcome: Progressing   Problem: Tissue Perfusion: Goal: Adequacy of tissue perfusion will improve Outcome: Progressing

## 2022-02-08 NOTE — Inpatient Diabetes Management (Signed)
Inpatient Diabetes Program Recommendations  AACE/ADA: New Consensus Statement on Inpatient Glycemic Control (2015)  Target Ranges:  Prepandial:   less than 140 mg/dL      Peak postprandial:   less than 180 mg/dL (1-2 hours)      Critically ill patients:  140 - 180 mg/dL   Lab Results  Component Value Date   GLUCAP 236 (H) 02/08/2022   HGBA1C 5.9 (H) 02/08/2022    Review of Glycemic Control  Latest Reference Range & Units 02/08/22 01:31 02/08/22 04:02 02/08/22 06:07 02/08/22 07:00 02/08/22 08:05 02/08/22 08:57  Glucose-Capillary 70 - 99 mg/dL 286 (H) 329 (H) 273 (H) 256 (H) 253 (H) 236 (H)   Diabetes history: None  Current orders for Inpatient glycemic control:  IV insulin High Dose steroids  A1c 5.9% on 2/6 Vital HP 40 ml/hour Solumedrol 80 mg Q24 hour  Inpatient Diabetes Program Recommendations:    -  Agree with IV insulin at this time due to high dose steroids and insulin gtt rate between 6.5-8 units/hour.  Thanks,  Tama Headings RN, MSN, BC-ADM Inpatient Diabetes Coordinator Team Pager [phone] (8a-5p)

## 2022-02-08 NOTE — Progress Notes (Signed)
ANTICOAGULATION CONSULT NOTE  Pharmacy Consult for Bivalirudin Indication: VV Ecmo  Allergies  Allergen Reactions   Shellfish Allergy Swelling    Throat, facial swelling   Tomato Itching and Other (See Comments)    Eczema flares    Patient Measurements: Height: 5' (152.4 cm) Weight: 75.9 kg (167 lb 5.3 oz) IBW/kg (Calculated) : 45.5   Vital Signs: Temp: 97.2 F (36.2 C) (02/06 1200) Temp Source: Esophageal (02/06 1200) BP: 144/64 (02/06 1148) Pulse Rate: 109 (02/06 1200)  Labs: Recent Labs    02/07/22 1516 02/07/22 1904 02/08/22 0129 02/08/22 0133 02/08/22 0307 02/08/22 0313 02/08/22 0405 02/08/22 0546 02/08/22 0608 02/08/22 0900 02/08/22 1200 02/08/22 1203  HGB 12.7   < > 9.0*   < >  --  9.0*   < > 10.2*  --  8.2*  --  7.8*  HCT 38.7   < > 28.5*   < >  --  28.2*   < > 30.0*  --  24.0*  --  23.0*  PLT 283  --  200  --   --  225  --   --   --   --   --   --   APTT  --    < > 197*  --   --  100*  --   --  43*  --  48*  --   LABPROT  --   --  17.0*  --   --  16.6*  --   --   --   --   --   --   INR  --   --  1.4*  --   --  1.4*  --   --   --   --   --   --   CREATININE 0.67  --   --   --  1.02*  --   --   --   --   --   --   --    < > = values in this interval not displayed.     Estimated Creatinine Clearance: 76.8 mL/min (A) (by C-G formula based on SCr of 1.02 mg/dL (H)).   Medical History: Past Medical History:  Diagnosis Date   Asthma     Medications:  Medications Prior to Admission  Medication Sig Dispense Refill Last Dose   albuterol (VENTOLIN HFA) 108 (90 Base) MCG/ACT inhaler Inhale 1-2 puffs into the lungs every 6 (six) hours as needed for wheezing or shortness of breath.   02/07/2022   ALBUTEROL IN Inhale 1 vial into the lungs every 6 (six) hours as needed (wheezing, shortness of breath).   UNK   triamcinolone ointment (KENALOG) 0.1 % Apply 1 Application topically daily as needed (eczema flares).   UNK    Assessment:  26yF with history of  asthma remained severely hypercarbic despite maximum intervention. The decision was made to initiate VV ECMO overnight. Pharmacy dosing bivalirudin.  aPTT this afternoon is subtherapeutic at 48 seconds but trending up. CBC ok this am. No bleeding issues so far per RN.   Goal of Therapy:  aPTT 50-80 seconds Monitor platelets by anticoagulation protocol: Yes   Plan:  Increase bivalirudin to 0.04mg /kg/h Recheck aPTT in 4h  Arrie Senate, PharmD, [place], [facility] Clinical Pharmacist [phone] Please check AMION for all [place] numbers 02/08/2022

## 2022-02-08 NOTE — Progress Notes (Signed)
NAME:  Kaitlyn Mcneil, MRN:  [id number], DOB:  [birth year], LOS: 1 ADMISSION DATE:  02/07/2022, CONSULTATION DATE:  02/07/22 REFERRING MD:  Zenia Resides, CHIEF COMPLAINT:  dyspnea   History of Present Illness:  26yF with history of asthma, AR, eczema who presented today with acute dyspnea. Tried home nebulizers/rescue inhalers without relief. Has been around others with URIs. Has had fever, cough. Reportedly hospitalized in 12/2021 for asthma exacerbation.   In ED she was given continuous neb, steroids, IV magnesium and placed on BiPAP. She continued to have remarkably increased work of breathing, fatigue and decision made with Dr. Zenia Resides to intubate. Required a great deal of sedation and neuromuscular blockade to achieve adequate minute ventilation. After sedation and NMB on PRVC 400/14/5 peaks 40-55, plateau 20-25, tidal volumes ~400. Intrinsic peep of 13.  Pertinent  Medical History  Asthma  AR  Eczema  Significant Hospital Events: Including procedures, antibiotic start and stop dates in addition to other pertinent events   02/07/22 intubated, deep sedation, nimbex  Interim History / Subjective:  Cannulated for ECMO overnight for developing hypotension, ongoing acidosis.  Objective   Blood pressure (!) 126/36, pulse (!) 124, temperature 97.7 F (36.5 C), temperature source Axillary, resp. rate (!) 0, height 5' (1.524 m), weight 75.9 kg, SpO2 96 %. CVP:  [11 mmHg-14 mmHg] 12 mmHg  Vent Mode: PCV FiO2 (%):  [40 %-100 %] 40 % Set Rate:  [12 bmp-18 bmp] 12 bmp Vt Set:  [400 mL] 400 mL PEEP:  [5 cmH20-10 cmH20] 10 cmH20 Pressure Support:  [10 cmH20] 10 cmH20 Plateau Pressure:  [23 cmH20-29 cmH20] 23 cmH20   Intake/Output Summary (Last 24 hours) at 02/08/2022 0641 Last data filed at 02/08/2022 0600 Gross per 24 hour  Intake 3301.12 ml  Output 2225 ml  Net 1076.12 ml    Filed Weights   02/07/22 1434 02/07/22 1800 02/08/22 0515  Weight: 68 kg 71.1 kg 75.9 kg    Examination: General:  critically ill appearing woman lying in bed intubated, on VV ECMO [place]/AT: eyes anicteric, ETT in place Neck: RIJ [place] cannula. Lungs: expiratory wheezing, on PRVC Pplat 26. No ETT secretions.  CV: S1S2, tachycardic Abdomen: soft, NT, hypoactive bowel sounds Extremities: warm, dry Skin: warm, dry, no rashes Neuro: RASS -5, breathing above the vent despite NMB`  7.35/37/73/20 LA >9 RVP pending Pneumococcal antigen negative  LDH 254 Ptt 43 INR 1.4  WBC 29.3 H/H  9/28.2  K+ 2.8 Bicarb 18 BUN 6 Cr 1.02 Phos 1.5  CXR personally reviewed> RML opacity, RIJ ECMO cannula  Resolved Hospital Problem list   Hypertensive emergency,resolved     Assessment & Plan:   Acute hypoxic and hypercapnic respiratory failure due to severe acute asthma exacerbation -Con't VV ECMO support + bivalirudin - Continue MV- back in PRVC with appropriately controlled plateau pressures. Wean sweep as able.  -con't NMB to prevent vent dyssynchrony.  -bronchodilators- brovana, yupelri, pulmicort, atrovent q4h - Steroids, con't high dose with aggressive PUD prophylaxis - Needs better outpatient asthma regimen- discussed with her mother that her lack of insurance has been her main barrier. Will need referral to community health and wellness at discharge. -Continue antihistamine and montelukast- need to be outpatient meds -Likely needs evaluation as an outpatient for biologics if she is not controlled on long-acting inhalers  Hypotension due to sedation -norepi to maintain MAP >65 -empiric antibiotics  Lactic acidosis, most likely side effect of aggressive albuterol -con't to monitor -ok to con't aggressive bronchodilator regimen; stop albuterol but  keep atrovent  Hypokalemia due to albuterol and bicarb - Repleted; con't to monitor closely  Hypophosphatemia -repleted, monitor  Hypergylcemia -insulin gtt -goal BG 130-180  Anemia -transfuse for Hb<7 or hemodynamically significant  bleeding -monitor for signs of bleeding  At risk for malnutrition -start trickle TF once NGT output slows  Diarrhea  -r/o C diff   Mother updated at bedside.   Best Practice (right click and "Reselect all SmartList Selections" daily)   Diet/type: tubefeeds DVT prophylaxis: other- bival GI prophylaxis: H2B and PPI Lines: Central line, Arterial Line, and yes and it is still needed Foley:  Yes, and it is still needed Code Status:  full code Last date of multidisciplinary goals of care discussion [ ]   Labs   CBC: Recent Labs  Lab 02/07/22 1516 02/07/22 1904 02/08/22 0129 02/08/22 0133 02/08/22 0214 02/08/22 0305 02/08/22 0313 02/08/22 0405 02/08/22 0546  WBC 16.7*  --  25.3*  --   --   --  29.3*  --   --   NEUTROABS 13.9*  --   --   --   --   --   --   --   --   HGB 12.7   < > 9.0*   < > 10.5* 9.5* 9.0* 10.2* 10.2*  HCT 38.7   < > 28.5*   < > 31.0* 28.0* 28.2* 30.0* 30.0*  MCV 83.0  --  87.4  --   --   --  86.2  --   --   PLT 283  --  200  --   --   --  225  --   --    < > = values in this interval not displayed.     Basic Metabolic Panel: Recent Labs  Lab 02/07/22 1516 02/07/22 1904 02/08/22 0214 02/08/22 0305 02/08/22 0307 02/08/22 0405 02/08/22 0546  NA 135   < > 141 142 140 142 142  K 3.1*   < > 2.9* 2.7* 2.8* 2.7* 2.9*  CL 101  --   --   --  101  --   --   CO2 22  --   --   --  18*  --   --   GLUCOSE 96  --   --   --  327*  --   --   BUN 6  --   --   --  6  --   --   CREATININE 0.67  --   --   --  1.02*  --   --   CALCIUM 9.2  --   --   --  7.0*  --   --   MG  --   --   --   --  2.4  --   --   PHOS  --   --   --   --  1.5*  --   --    < > = values in this interval not displayed.    GFR: Estimated Creatinine Clearance: 76.8 mL/min (A) (by C-G formula based on SCr of 1.02 mg/dL (H)). Recent Labs  Lab 02/07/22 1516 02/07/22 1843 02/07/22 2124 02/08/22 0129 02/08/22 0131 02/08/22 0313  PROCALCITON  --  <0.10  --   --   --   --   WBC 16.7*  --    --  25.3*  --  29.3*  LATICACIDVEN  --  1.1 5.5*  --  >9.0* >9.0*     Liver Function Tests: Recent Labs  Lab 02/08/22 0307  AST 43*  ALT 17  ALKPHOS 49  BILITOT 0.3  PROT 5.6*  ALBUMIN 3.2*    This patient is critically ill with multiple organ system failure which requires frequent high complexity decision making, assessment, support, evaluation, and titration of therapies. This was completed through the application of advanced monitoring technologies and extensive interpretation of multiple databases. During this encounter critical care time was devoted to patient care services described in this note for 55 minutes.  Julian Hy, DO 02/08/22 8:47 AM [facility]  For contact information, see Amion. If no response to pager, please call PCCM consult pager. After hours, 7PM- 7AM, please call Elink.

## 2022-02-09 ENCOUNTER — Inpatient Hospital Stay (HOSPITAL_COMMUNITY): Payer: BC Managed Care – PPO

## 2022-02-09 LAB — POCT I-STAT 7, (LYTES, BLD GAS, ICA,H+H)
Acid-Base Excess: 0 mmol/L (ref 0.0–2.0)
Acid-Base Excess: 0 mmol/L (ref 0.0–2.0)
Acid-Base Excess: 0 mmol/L (ref 0.0–2.0)
Acid-Base Excess: 0 mmol/L (ref 0.0–2.0)
Acid-Base Excess: 1 mmol/L (ref 0.0–2.0)
Acid-Base Excess: 1 mmol/L (ref 0.0–2.0)
Acid-Base Excess: 1 mmol/L (ref 0.0–2.0)
Acid-Base Excess: 1 mmol/L (ref 0.0–2.0)
Acid-Base Excess: 1 mmol/L (ref 0.0–2.0)
Acid-Base Excess: 1 mmol/L (ref 0.0–2.0)
Acid-Base Excess: 2 mmol/L (ref 0.0–2.0)
Acid-base deficit: 1 mmol/L (ref 0.0–2.0)
Acid-base deficit: 1 mmol/L (ref 0.0–2.0)
Acid-base deficit: 1 mmol/L (ref 0.0–2.0)
Bicarbonate: 23.9 mmol/L (ref 20.0–28.0)
Bicarbonate: 24.4 mmol/L (ref 20.0–28.0)
Bicarbonate: 24.7 mmol/L (ref 20.0–28.0)
Bicarbonate: 25 mmol/L (ref 20.0–28.0)
Bicarbonate: 25.2 mmol/L (ref 20.0–28.0)
Bicarbonate: 25.3 mmol/L (ref 20.0–28.0)
Bicarbonate: 25.6 mmol/L (ref 20.0–28.0)
Bicarbonate: 25.7 mmol/L (ref 20.0–28.0)
Bicarbonate: 25.9 mmol/L (ref 20.0–28.0)
Bicarbonate: 26.3 mmol/L (ref 20.0–28.0)
Bicarbonate: 26.4 mmol/L (ref 20.0–28.0)
Bicarbonate: 27.3 mmol/L (ref 20.0–28.0)
Bicarbonate: 27.3 mmol/L (ref 20.0–28.0)
Bicarbonate: 28.5 mmol/L — ABNORMAL HIGH (ref 20.0–28.0)
Calcium, Ion: 1.19 mmol/L (ref 1.15–1.40)
Calcium, Ion: 1.2 mmol/L (ref 1.15–1.40)
Calcium, Ion: 1.2 mmol/L (ref 1.15–1.40)
Calcium, Ion: 1.21 mmol/L (ref 1.15–1.40)
Calcium, Ion: 1.22 mmol/L (ref 1.15–1.40)
Calcium, Ion: 1.22 mmol/L (ref 1.15–1.40)
Calcium, Ion: 1.23 mmol/L (ref 1.15–1.40)
Calcium, Ion: 1.23 mmol/L (ref 1.15–1.40)
Calcium, Ion: 1.23 mmol/L (ref 1.15–1.40)
Calcium, Ion: 1.23 mmol/L (ref 1.15–1.40)
Calcium, Ion: 1.25 mmol/L (ref 1.15–1.40)
Calcium, Ion: 1.26 mmol/L (ref 1.15–1.40)
Calcium, Ion: 1.26 mmol/L (ref 1.15–1.40)
Calcium, Ion: 1.28 mmol/L (ref 1.15–1.40)
HCT: 22 % — ABNORMAL LOW (ref 36.0–46.0)
HCT: 22 % — ABNORMAL LOW (ref 36.0–46.0)
HCT: 23 % — ABNORMAL LOW (ref 36.0–46.0)
HCT: 23 % — ABNORMAL LOW (ref 36.0–46.0)
HCT: 23 % — ABNORMAL LOW (ref 36.0–46.0)
HCT: 23 % — ABNORMAL LOW (ref 36.0–46.0)
HCT: 23 % — ABNORMAL LOW (ref 36.0–46.0)
HCT: 24 % — ABNORMAL LOW (ref 36.0–46.0)
HCT: 24 % — ABNORMAL LOW (ref 36.0–46.0)
HCT: 24 % — ABNORMAL LOW (ref 36.0–46.0)
HCT: 25 % — ABNORMAL LOW (ref 36.0–46.0)
HCT: 25 % — ABNORMAL LOW (ref 36.0–46.0)
HCT: 25 % — ABNORMAL LOW (ref 36.0–46.0)
HCT: 25 % — ABNORMAL LOW (ref 36.0–46.0)
Hemoglobin: 7.5 g/dL — ABNORMAL LOW (ref 12.0–15.0)
Hemoglobin: 7.5 g/dL — ABNORMAL LOW (ref 12.0–15.0)
Hemoglobin: 7.8 g/dL — ABNORMAL LOW (ref 12.0–15.0)
Hemoglobin: 7.8 g/dL — ABNORMAL LOW (ref 12.0–15.0)
Hemoglobin: 7.8 g/dL — ABNORMAL LOW (ref 12.0–15.0)
Hemoglobin: 7.8 g/dL — ABNORMAL LOW (ref 12.0–15.0)
Hemoglobin: 7.8 g/dL — ABNORMAL LOW (ref 12.0–15.0)
Hemoglobin: 8.2 g/dL — ABNORMAL LOW (ref 12.0–15.0)
Hemoglobin: 8.2 g/dL — ABNORMAL LOW (ref 12.0–15.0)
Hemoglobin: 8.2 g/dL — ABNORMAL LOW (ref 12.0–15.0)
Hemoglobin: 8.5 g/dL — ABNORMAL LOW (ref 12.0–15.0)
Hemoglobin: 8.5 g/dL — ABNORMAL LOW (ref 12.0–15.0)
Hemoglobin: 8.5 g/dL — ABNORMAL LOW (ref 12.0–15.0)
Hemoglobin: 8.5 g/dL — ABNORMAL LOW (ref 12.0–15.0)
O2 Saturation: 100 %
O2 Saturation: 100 %
O2 Saturation: 100 %
O2 Saturation: 100 %
O2 Saturation: 100 %
O2 Saturation: 88 %
O2 Saturation: 89 %
O2 Saturation: 97 %
O2 Saturation: 97 %
O2 Saturation: 98 %
O2 Saturation: 98 %
O2 Saturation: 99 %
O2 Saturation: 99 %
O2 Saturation: 99 %
Patient temperature: 36.2
Patient temperature: 36.4
Patient temperature: 36.5
Patient temperature: 36.5
Patient temperature: 36.6
Patient temperature: 36.6
Patient temperature: 36.6
Patient temperature: 36.6
Patient temperature: 36.6
Patient temperature: 36.6
Patient temperature: 36.7
Patient temperature: 36.9
Potassium: 4.1 mmol/L (ref 3.5–5.1)
Potassium: 4.2 mmol/L (ref 3.5–5.1)
Potassium: 4.2 mmol/L (ref 3.5–5.1)
Potassium: 4.2 mmol/L (ref 3.5–5.1)
Potassium: 4.3 mmol/L (ref 3.5–5.1)
Potassium: 4.3 mmol/L (ref 3.5–5.1)
Potassium: 4.3 mmol/L (ref 3.5–5.1)
Potassium: 4.3 mmol/L (ref 3.5–5.1)
Potassium: 4.3 mmol/L (ref 3.5–5.1)
Potassium: 4.5 mmol/L (ref 3.5–5.1)
Potassium: 4.6 mmol/L (ref 3.5–5.1)
Potassium: 4.6 mmol/L (ref 3.5–5.1)
Potassium: 4.7 mmol/L (ref 3.5–5.1)
Potassium: 4.9 mmol/L (ref 3.5–5.1)
Sodium: 141 mmol/L (ref 135–145)
Sodium: 141 mmol/L (ref 135–145)
Sodium: 142 mmol/L (ref 135–145)
Sodium: 142 mmol/L (ref 135–145)
Sodium: 143 mmol/L (ref 135–145)
Sodium: 143 mmol/L (ref 135–145)
Sodium: 143 mmol/L (ref 135–145)
Sodium: 143 mmol/L (ref 135–145)
Sodium: 143 mmol/L (ref 135–145)
Sodium: 143 mmol/L (ref 135–145)
Sodium: 143 mmol/L (ref 135–145)
Sodium: 143 mmol/L (ref 135–145)
Sodium: 144 mmol/L (ref 135–145)
Sodium: 144 mmol/L (ref 135–145)
TCO2: 25 mmol/L (ref 22–32)
TCO2: 26 mmol/L (ref 22–32)
TCO2: 26 mmol/L (ref 22–32)
TCO2: 26 mmol/L (ref 22–32)
TCO2: 26 mmol/L (ref 22–32)
TCO2: 27 mmol/L (ref 22–32)
TCO2: 27 mmol/L (ref 22–32)
TCO2: 27 mmol/L (ref 22–32)
TCO2: 27 mmol/L (ref 22–32)
TCO2: 28 mmol/L (ref 22–32)
TCO2: 28 mmol/L (ref 22–32)
TCO2: 29 mmol/L (ref 22–32)
TCO2: 29 mmol/L (ref 22–32)
TCO2: 31 mmol/L (ref 22–32)
pCO2 arterial: 37 mmHg (ref 32–48)
pCO2 arterial: 38.2 mmHg (ref 32–48)
pCO2 arterial: 38.5 mmHg (ref 32–48)
pCO2 arterial: 39.1 mmHg (ref 32–48)
pCO2 arterial: 40.9 mmHg (ref 32–48)
pCO2 arterial: 41.3 mmHg (ref 32–48)
pCO2 arterial: 41.6 mmHg (ref 32–48)
pCO2 arterial: 41.7 mmHg (ref 32–48)
pCO2 arterial: 45 mmHg (ref 32–48)
pCO2 arterial: 48 mmHg (ref 32–48)
pCO2 arterial: 51.6 mmHg — ABNORMAL HIGH (ref 32–48)
pCO2 arterial: 53.7 mmHg — ABNORMAL HIGH (ref 32–48)
pCO2 arterial: 53.8 mmHg — ABNORMAL HIGH (ref 32–48)
pCO2 arterial: 64.2 mmHg — ABNORMAL HIGH (ref 32–48)
pH, Arterial: 7.254 — ABNORMAL LOW (ref 7.35–7.45)
pH, Arterial: 7.3 — ABNORMAL LOW (ref 7.35–7.45)
pH, Arterial: 7.303 — ABNORMAL LOW (ref 7.35–7.45)
pH, Arterial: 7.311 — ABNORMAL LOW (ref 7.35–7.45)
pH, Arterial: 7.363 (ref 7.35–7.45)
pH, Arterial: 7.373 (ref 7.35–7.45)
pH, Arterial: 7.383 (ref 7.35–7.45)
pH, Arterial: 7.397 (ref 7.35–7.45)
pH, Arterial: 7.399 (ref 7.35–7.45)
pH, Arterial: 7.4 (ref 7.35–7.45)
pH, Arterial: 7.401 (ref 7.35–7.45)
pH, Arterial: 7.416 (ref 7.35–7.45)
pH, Arterial: 7.418 (ref 7.35–7.45)
pH, Arterial: 7.421 (ref 7.35–7.45)
pO2, Arterial: 112 mmHg — ABNORMAL HIGH (ref 83–108)
pO2, Arterial: 115 mmHg — ABNORMAL HIGH (ref 83–108)
pO2, Arterial: 119 mmHg — ABNORMAL HIGH (ref 83–108)
pO2, Arterial: 121 mmHg — ABNORMAL HIGH (ref 83–108)
pO2, Arterial: 139 mmHg — ABNORMAL HIGH (ref 83–108)
pO2, Arterial: 144 mmHg — ABNORMAL HIGH (ref 83–108)
pO2, Arterial: 174 mmHg — ABNORMAL HIGH (ref 83–108)
pO2, Arterial: 176 mmHg — ABNORMAL HIGH (ref 83–108)
pO2, Arterial: 180 mmHg — ABNORMAL HIGH (ref 83–108)
pO2, Arterial: 186 mmHg — ABNORMAL HIGH (ref 83–108)
pO2, Arterial: 195 mmHg — ABNORMAL HIGH (ref 83–108)
pO2, Arterial: 57 mmHg — ABNORMAL LOW (ref 83–108)
pO2, Arterial: 62 mmHg — ABNORMAL LOW (ref 83–108)
pO2, Arterial: 90 mmHg (ref 83–108)

## 2022-02-09 LAB — APTT
aPTT: 49 seconds — ABNORMAL HIGH (ref 24–36)
aPTT: 49 seconds — ABNORMAL HIGH (ref 24–36)
aPTT: 49 seconds — ABNORMAL HIGH (ref 24–36)

## 2022-02-09 LAB — GLUCOSE, CAPILLARY
Glucose-Capillary: 119 mg/dL — ABNORMAL HIGH (ref 70–99)
Glucose-Capillary: 120 mg/dL — ABNORMAL HIGH (ref 70–99)
Glucose-Capillary: 126 mg/dL — ABNORMAL HIGH (ref 70–99)
Glucose-Capillary: 127 mg/dL — ABNORMAL HIGH (ref 70–99)
Glucose-Capillary: 131 mg/dL — ABNORMAL HIGH (ref 70–99)
Glucose-Capillary: 133 mg/dL — ABNORMAL HIGH (ref 70–99)
Glucose-Capillary: 136 mg/dL — ABNORMAL HIGH (ref 70–99)

## 2022-02-09 LAB — BASIC METABOLIC PANEL
Anion gap: 7 (ref 5–15)
Anion gap: 6 (ref 5–15)
BUN: 6 mg/dL (ref 6–20)
BUN: 13 mg/dL (ref 6–20)
CO2: 24 mmol/L (ref 22–32)
CO2: 27 mmol/L (ref 22–32)
Calcium: 8 mg/dL — ABNORMAL LOW (ref 8.9–10.3)
Calcium: 8.1 mg/dL — ABNORMAL LOW (ref 8.9–10.3)
Chloride: 109 mmol/L (ref 98–111)
Chloride: 110 mmol/L (ref 98–111)
Creatinine, Ser: 0.68 mg/dL (ref 0.44–1.00)
Creatinine, Ser: 0.74 mg/dL (ref 0.44–1.00)
GFR, Estimated: >60 mL/min (ref >60)
GFR, Estimated: >60 mL/min (ref >60)
Glucose, Bld: 122 mg/dL — ABNORMAL HIGH (ref 70–99)
Glucose, Bld: 120 mg/dL — ABNORMAL HIGH (ref 70–99)
Potassium: 4.8 mmol/L (ref 3.5–5.1)
Potassium: 4.6 mmol/L (ref 3.5–5.1)
Sodium: 140 mmol/L (ref 135–145)
Sodium: 143 mmol/L (ref 135–145)

## 2022-02-09 LAB — BPAM FFP
Blood Product Expiration Date: 202402052359
Blood Product Expiration Date: 202402052359
Blood Product Expiration Date: 202402102359
Blood Product Expiration Date: 202402102359
ISSUE DATE / TIME: 202402041104
Unit Type and Rh: 5100
Unit Type and Rh: 5100
Unit Type and Rh: 6200
Unit Type and Rh: 6200

## 2022-02-09 LAB — LACTIC ACID, PLASMA
Lactic Acid, Venous: 0.7 mmol/L (ref 0.5–1.9)
Lactic Acid, Venous: 0.7 mmol/L (ref 0.5–1.9)

## 2022-02-09 LAB — PREPARE FRESH FROZEN PLASMA
Unit division: 0
Unit division: 0

## 2022-02-09 LAB — CBC
HCT: 23.9 % — ABNORMAL LOW (ref 36.0–46.0)
HCT: 24.2 % — ABNORMAL LOW (ref 36.0–46.0)
Hemoglobin: 7.8 g/dL — ABNORMAL LOW (ref 12.0–15.0)
Hemoglobin: 7.6 g/dL — ABNORMAL LOW (ref 12.0–15.0)
MCH: 27.4 pg (ref 26.0–34.0)
MCH: 27.1 pg (ref 26.0–34.0)
MCHC: 32.6 g/dL (ref 30.0–36.0)
MCHC: 31.4 g/dL (ref 30.0–36.0)
MCV: 83.9 fL (ref 80.0–100.0)
MCV: 86.4 fL (ref 80.0–100.0)
Platelets: 170 10*3/uL (ref 150–400)
Platelets: 170 10*3/uL (ref 150–400)
RBC: 2.85 MIL/uL — ABNORMAL LOW (ref 3.87–5.11)
RBC: 2.8 MIL/uL — ABNORMAL LOW (ref 3.87–5.11)
RDW: 16.5 % — ABNORMAL HIGH (ref 11.5–15.5)
RDW: 17.1 % — ABNORMAL HIGH (ref 11.5–15.5)
WBC: 27.5 10*3/uL — ABNORMAL HIGH (ref 4.0–10.5)
WBC: 28.1 10*3/uL — ABNORMAL HIGH (ref 4.0–10.5)
nRBC: 0 % (ref 0.0–0.2)
nRBC: 0 % (ref 0.0–0.2)

## 2022-02-09 LAB — LEGIONELLA PNEUMOPHILA SEROGP 1 UR AG: L. pneumophila Serogp 1 Ur Ag: NEGATIVE

## 2022-02-09 LAB — FIBRINOGEN: Fibrinogen: 291 mg/dL (ref 210–475)

## 2022-02-09 LAB — HEPATIC FUNCTION PANEL
ALT: 14 U/L (ref 0–44)
AST: 30 U/L (ref 15–41)
Albumin: 3 g/dL — ABNORMAL LOW (ref 3.5–5.0)
Alkaline Phosphatase: 41 U/L (ref 38–126)
Bilirubin, Direct: <0.1 mg/dL (ref 0.0–0.2)
Total Bilirubin: 0.2 mg/dL — ABNORMAL LOW (ref 0.3–1.2)
Total Protein: 5.2 g/dL — ABNORMAL LOW (ref 6.5–8.1)

## 2022-02-09 LAB — PROTIME-INR
INR: 1.5 — ABNORMAL HIGH (ref 0.8–1.2)
Prothrombin Time: 17.5 seconds — ABNORMAL HIGH (ref 11.4–15.2)

## 2022-02-09 LAB — MAGNESIUM
Magnesium: 2.3 mg/dL (ref 1.7–2.4)
Magnesium: 2.5 mg/dL — ABNORMAL HIGH (ref 1.7–2.4)

## 2022-02-09 LAB — PHOSPHORUS
Phosphorus: 3.7 mg/dL (ref 2.5–4.6)
Phosphorus: 4.8 mg/dL — ABNORMAL HIGH (ref 2.5–4.6)

## 2022-02-09 LAB — LACTATE DEHYDROGENASE: LDH: 218 U/L — ABNORMAL HIGH (ref 98–192)

## 2022-02-09 LAB — TRIGLYCERIDES: Triglycerides: 48 mg/dL (ref <150)

## 2022-02-09 MED ORDER — MIDAZOLAM HCL 2 MG/2ML IJ SOLN
2.0000 mg | INTRAMUSCULAR | Status: DC | PRN
  Administered 2022-02-09 – 2022-02-16 (×12): 2 mg via INTRAVENOUS
  Filled 2022-02-09 (×12): qty 2

## 2022-02-09 MED ORDER — FUROSEMIDE 10 MG/ML IJ SOLN
20.0000 mg | Freq: Three times a day (TID) | INTRAMUSCULAR | Status: AC
  Administered 2022-02-09 – 2022-02-10 (×3): 20 mg via INTRAVENOUS
  Filled 2022-02-09 (×3): qty 2

## 2022-02-09 MED ORDER — KETAMINE BOLUS VIA INFUSION
0.5000 mg/kg | Freq: Once | INTRAVENOUS | Status: AC
  Administered 2022-02-09: 38.75 mg via INTRAVENOUS
  Filled 2022-02-09: qty 40

## 2022-02-09 MED ORDER — VITAL HIGH PROTEIN PO LIQD: 1000.0000 mL | ORAL | Status: DC

## 2022-02-09 MED ORDER — VITAL 1.5 CAL PO LIQD
1000.0000 mL | ORAL | Status: DC
  Administered 2022-02-09 – 2022-02-11 (×2): 1000 mL

## 2022-02-09 MED ORDER — KETAMINE HCL-SODIUM CHLORIDE 1000-0.9 MG/100ML-% IV SOLN
0.4000 mg/kg/h | INTRAVENOUS | Status: DC
  Administered 2022-02-09 – 2022-02-11 (×3): 0.4 mg/kg/h via INTRAVENOUS
  Filled 2022-02-09 (×2): qty 100

## 2022-02-09 MED ORDER — ALBUMIN HUMAN 5 % IV SOLN: 12.5000 g | INTRAVENOUS | Status: DC | PRN

## 2022-02-09 MED ORDER — HYDRALAZINE HCL 20 MG/ML IJ SOLN
10.0000 mg | Freq: Four times a day (QID) | INTRAMUSCULAR | Status: DC | PRN
  Administered 2022-02-09 – 2022-02-16 (×10): 10 mg via INTRAVENOUS
  Filled 2022-02-09 (×12): qty 1

## 2022-02-09 MED ORDER — SODIUM CHLORIDE 0.9 % IV SOLN: INTRAVENOUS | Status: DC | PRN

## 2022-02-09 MED ORDER — PROSOURCE TF20 ENFIT COMPATIBL EN LIQD
60.0000 mL | Freq: Four times a day (QID) | ENTERAL | Status: DC
  Administered 2022-02-09 – 2022-02-11 (×8): 60 mL
  Filled 2022-02-09 (×7): qty 60

## 2022-02-09 MED FILL — Cefazolin Sodium For Inj 2 GM: INTRAMUSCULAR | Qty: 2 | Status: AC

## 2022-02-09 NOTE — Procedures (Signed)
Cortrak  Person Inserting Tube:  Krystal Teachey D, RD Tube Type:  Cortrak - 55 inches Tube Size:  10 Tube Location:  Left nare Secured by: Bridle Technique Used to Measure Tube Placement:  Marking at nare/corner of mouth Cortrak Secured At:  68 cm Procedure Comments:  Cortrak Tube Team Note:  Consult received to place a Cortrak feeding tube.   X-ray is required, abdominal x-ray has been ordered by the Cortrak team. Please confirm tube placement before using the Cortrak tube.   If the tube becomes dislodged please keep the tube and contact the Cortrak team at [email or web address] for replacement.  If after hours and replacement cannot be delayed, place a NG tube and confirm placement with an abdominal x-ray.    Faryal Marxen, RD, LDN Clinical Dietitian RD pager # available in AMION  After hours/weekend pager # available in AMION     

## 2022-02-09 NOTE — Progress Notes (Addendum)
Nutrition Follow-up  DOCUMENTATION CODES:   Not applicable  INTERVENTION:   Tube Feeding via Cortrak:  Vital 1.5 at 20 ml/hr today Goal TF goal: Vital 1.5 at 55 ml/hr with Pro-source TF20 60 mL BID TF at goal rate provides 129 g of protein, 2140 kcals and 1003 mL of free water  Increased Pro-Source TF20 provision while on trickle TF, ordered QID. Each packet provides 80 kcals, 20 g of protein  Additional calorie provision via lipid in propofol infusion  NUTRITION DIAGNOSIS:   Inadequate oral intake related to acute illness as evidenced by NPO status.  GOAL:   Patient will meet greater than or equal to 90% of their needs   MONITOR:   Vent status, Labs, Weight trends  REASON FOR ASSESSMENT:   Consult, Ventilator Enteral/tube feeding initiation and management (ECMO)  ASSESSMENT:   27 yo female admitted with acute respiratory failure due to severe asthma exacerbation requiring intubation with deep sedation, paralyzed and cannulated for VV ECMO. PMH includes asthma, AR, eczema   2/05 Admitted, Intubated, deep sedation and paralyzed on nimbex gtt 2/06 VV ECMO cannulation at 12 am 2/07 Cortrak, Trickle TF, off nimbex  Remains on ECMO, sweep 3. Noted possible decannulation in 24-48 hours Pt remains on vent support, off paralytic. Started ketamine today, remains deeply sedated Propofol: 20.4  ml/hr (539 kcals/24 hours)  OG tube with some bilious output, abdomen soft. Large BM on 2/05 Cortrak placed today, attempted post pyloric position but unsuccessful. Cortrak tip gastric. Discussed with Dr. Carlis Abbott and ok to begin trickles today. OG to remain in place incase need for suction  Labs: reviewed Meds: ss novolog, levemir, colace, solumedrol, KCl, Kphos   Diet Order:   Diet Order             Diet NPO time specified Except for: Sips with Meds  Diet effective midnight                   EDUCATION NEEDS:   Not appropriate for education at this time  Skin:  Skin  Assessment: Reviewed RN Assessment  Last BM:  2/5  Height:   Ht Readings from Last 1 Encounters:  02/07/22 5' (1.524 m)    Weight:   Wt Readings from Last 1 Encounters:  02/09/22 77.5 kg     BMI:  Body mass index is 33.37 kg/m.  Estimated Nutritional Needs:   Kcal:  2000-2200 kcals  Protein:  115-135 g  Fluid:  >/= 2L (limit volume from TF at this time)   Kerman Passey MS, RDN, LDN, CNSC Registered Dietitian 3 Clinical Nutrition RD Pager and On-Call Pager Number Located in [place]

## 2022-02-09 NOTE — Progress Notes (Signed)
ANTICOAGULATION CONSULT NOTE  Pharmacy Consult for Bivalirudin Indication: VV Ecmo  Allergies  Allergen Reactions   Shellfish Allergy Swelling    Throat, facial swelling   Tomato Itching and Other (See Comments)    Eczema flares    Patient Measurements: Height: 5' (152.4 cm) Weight: 77.5 kg (170 lb 13.7 oz) IBW/kg (Calculated) : 45.5   Vital Signs: Temp: 97.7 F (36.5 C) (02/07 1200) Temp Source: Core (02/07 1200) BP: 138/68 (02/07 0742) Pulse Rate: 95 (02/07 1500)  Labs: Recent Labs    02/08/22 0129 02/08/22 0133 02/08/22 0307 02/08/22 0313 02/08/22 0405 02/08/22 1702 02/08/22 1704 02/08/22 2153 02/09/22 0011 02/09/22 0345 02/09/22 0900 02/09/22 0932 02/09/22 1328 02/09/22 1348 02/09/22 1357  HGB 9.0*   < >  --  9.0*   < > 7.4*   < >  --    < > 7.8*  --    < > 7.8* 7.8* 7.8*  HCT 28.5*   < >  --  28.2*   < > 23.2*   < >  --    < > 23.9*  --    < > 23.0* 23.0* 23.0*  PLT 200  --   --  225  --  172  --   --   --  170  --   --   --   --   --   APTT 197*  --   --  100*   < >  --    < > 49*  --  49*  49* 49*  --   --   --   --   LABPROT 17.0*  --   --  16.6*  --   --   --   --   --  17.5*  --   --   --   --   --   INR 1.4*  --   --  1.4*  --   --   --   --   --  1.5*  --   --   --   --   --   CREATININE  --   --  1.08*  1.02*  --   --  0.63  --   --   --  0.68  --   --   --   --   --    < > = values in this interval not displayed.     Estimated Creatinine Clearance: 98.9 mL/min (by C-G formula based on SCr of 0.68 mg/dL).   Medical History: Past Medical History:  Diagnosis Date   Asthma     Medications:  Medications Prior to Admission  Medication Sig Dispense Refill Last Dose   albuterol (VENTOLIN HFA) 108 (90 Base) MCG/ACT inhaler Inhale 1-2 puffs into the lungs every 6 (six) hours as needed for wheezing or shortness of breath.   02/07/2022   ALBUTEROL IN Inhale 1 vial into the lungs every 6 (six) hours as needed (wheezing, shortness of breath).   UNK    triamcinolone ointment (KENALOG) 0.1 % Apply 1 Application topically daily as needed (eczema flares).   UNK    Assessment:  26yF with history of asthma remained severely hypercarbic despite maximum intervention. The decision was made to initiate VV ECMO overnight. Pharmacy dosing bivalirudin.  -aPTT subtherapeutic (49) on bivalirudin 0.053 mg/kg/hr.   Goal of Therapy:  aPTT 50-80 seconds Monitor platelets by anticoagulation protocol: Yes   Plan:  -Continue bivalirudin at the current rate since aPTT is  close to goal  -Daily aPTT and CBC  Hildred Laser, PharmD Clinical Pharmacist **Pharmacist phone directory can now be found on [email or web address] ([email or web address]).  Listed under [place].

## 2022-02-09 NOTE — Progress Notes (Signed)
Advanced Heart Failure Rounding Note  PCP-Cardiologist: None   Subjective:    Remains intubated/sedated/paralyzed.   Viral panel + for rhinovirus  Off NE. Got lasix yesterday   ECMO 4.4L with sweep 3   Vent 40% Rate Plateau pressure 21  7.41/37/176/100%   Lactic acid 0.7  Hgb 7.8  LDH 218   On bival PTT 49    Objective:   Weight Range: 77.5 kg Body mass index is 33.37 kg/m.   Vital Signs:   Temp:  [96.1 F (35.6 C)-97.5 F (36.4 C)] 96.6 F (35.9 C) (02/07 0915) Pulse Rate:  [80-122] 80 (02/07 0915) Resp:  [0-20] 20 (02/07 0915) BP: (138-144)/(64-68) 138/68 (02/07 0742) SpO2:  [98 %-100 %] 100 % (02/07 0915) Arterial Line BP: (101-181)/(48-93) 141/66 (02/07 0915) FiO2 (%):  [40 %] 40 % (02/07 0800) Weight:  [77.5 kg] 77.5 kg (02/07 0356) Last BM Date : 02/07/22  Weight change: Filed Weights   02/07/22 1800 02/08/22 0515 02/09/22 0356  Weight: 71.1 kg 75.9 kg 77.5 kg    Intake/Output:   Intake/Output Summary (Last 24 hours) at 02/09/2022 0943 Last data filed at 02/09/2022 0900 Gross per 24 hour  Intake 5236.82 ml  Output 3120 ml  Net 2116.82 ml       Physical Exam    General:  Intubated/sedate  HEENT: normal + ETT swollen  Neck: supple. RIJ ECMO site ok . Carotids 2+ bilat; no bruits. No lymphadenopathy or thryomegaly appreciated. Cor: PMI nondisplaced. Regular rate & rhythm. No rubs, gallops or murmurs. Lungs: + wheeze Abdomen: soft, nontender, nondistended. No hepatosplenomegaly. No bruits or masses. Good bowel sounds. Extremities: no cyanosis, clubbing, rash, tr edema Neuro: alert & orientedx3, cranial nerves grossly intact. moves all 4 extremities w/o difficulty. Affect pleasant   Telemetry   Sinus 80s Personally reviewed   Labs    CBC Recent Labs    02/07/22 1516 02/07/22 1904 02/08/22 1702 02/08/22 2008 02/09/22 0342 02/09/22 0345  WBC 16.7*   < > 19.6*  --   --  27.5*  NEUTROABS 13.9*  --   --   --   --   --   HGB  12.7   < > 7.4*   < > 7.8* 7.8*  HCT 38.7   < > 23.2*   < > 23.0* 23.9*  MCV 83.0   < > 85.3  --   --  83.9  PLT 283   < > 172  --   --  170   < > = values in this interval not displayed.    Basic Metabolic Panel Recent Labs    02/08/22 1702 02/08/22 1704 02/08/22 1754 02/08/22 2008 02/09/22 0342 02/09/22 0345  NA 143  --   --    < > 141 140  K 3.6  --   --    < > 4.9 4.8  CL 112*  --   --   --   --  109  CO2 25  --   --   --   --  24  GLUCOSE 90  --   --   --   --  122*  BUN 5*  --   --   --   --  6  CREATININE 0.63  --   --   --   --  0.68  CALCIUM 7.7*  --   --   --   --  8.0*  MG  --  2.3  --   --   --  2.3  PHOS  --   --  1.6*  --   --  3.7   < > = values in this interval not displayed.    Liver Function Tests Recent Labs    02/08/22 0307 02/09/22 0345  AST 46*  43* 30  ALT 16  17 14   ALKPHOS 49  49 41  BILITOT 0.2*  0.3 0.2*  PROT 5.7*  5.6* 5.2*  ALBUMIN 3.0*  3.2* 3.0*    No results for input(s): "LIPASE", "AMYLASE" in the last 72 hours. Cardiac Enzymes No results for input(s): "CKTOTAL", "CKMB", "CKMBINDEX", "TROPONINI" in the last 72 hours.  BNP: BNP (last 3 results) No results for input(s): "BNP" in the last 8760 hours.  ProBNP (last 3 results) No results for input(s): "PROBNP" in the last 8760 hours.   D-Dimer No results for input(s): "DDIMER" in the last 72 hours. Hemoglobin A1C Recent Labs    02/08/22 0307  HGBA1C 5.9*    Fasting Lipid Panel Recent Labs    02/09/22 0345  TRIG 48    Thyroid Function Tests No results for input(s): "TSH", "T4TOTAL", "T3FREE", "THYROIDAB" in the last 72 hours.  Invalid input(s): "FREET3"  Other results:   Imaging    DG CHEST PORT 1 VIEW  Result Date: 02/09/2022 CLINICAL DATA:  ECMO. EXAM: PORTABLE CHEST 1 VIEW COMPARISON:  02/08/2022 FINDINGS: Endotracheal tube is stable and measures 2.6 cm above the carina. Again noted is a right jugular ECMO catheter that extends into the IVC but the  tip is not imaged. Negative for pneumothorax. Nasogastric tube extends into the abdomen but the tip is beyond the image. Persistent patchy densities in the left hilum, left lower lobe and medial right lung base. These lung findings are not significantly changed. Heart size is within normal limits and stable. Left arm PICC line has been removed. Left jugular central venous catheter is present and the tip is near the superior cavoatrial junction. IMPRESSION: 1. No significant change in the bilateral lung densities. 2. Support apparatuses as described. Negative for pneumothorax. 3. Left arm PICC line has been removed. Electronically Signed   By: Markus Daft M.D.   On: 02/09/2022 08:45   DG Chest Port 1 View  Result Date: 02/08/2022 CLINICAL DATA:  PICC line placement EXAM: PORTABLE CHEST 1 VIEW COMPARISON:  February 08, 2022 at 4:40 a.m. FINDINGS: The ET tube, left central line, and ECMO catheter are in stable position. The NG tube terminates below today's film. A new left PICC line is malposition with the distal tip flipped back on itself. The distal tip is likely in the left brachycephalic vein. Opacity in the medial right lung base persists. Increased opacity in the left mid lung. No pneumothorax. No other changes. IMPRESSION: 1. The new left PICC line is malpositioned with the distal tip flipped back on itself. The distal tip is in the left brachycephalic vein. Recommend repositioning. 2. Other support apparatus as above. 3. Persistent opacity in the medial right lung base. 4. Increasing opacity in the left mid lung. These results will be called to the ordering clinician or representative by the Radiologist Assistant, and communication documented in the PACS or [employer]. Electronically Signed   By: Dorise Bullion III M.D.   On: 02/08/2022 15:28     Medications:     Scheduled Medications:  arformoterol  15 mcg Nebulization BID   artificial tears  1 Application Both Eyes Z1I   budesonide  (PULMICORT) nebulizer solution  0.5 mg Nebulization BID  Chlorhexidine Gluconate Cloth  6 each Topical Daily   docusate  100 mg Per Tube BID   famotidine  20 mg Per Tube BID   feeding supplement (PROSource TF20)  60 mL Per Tube Daily   feeding supplement (VITAL HIGH PROTEIN)  1,000 mL Per Tube Q24H   furosemide  20 mg Intravenous Q8H   insulin aspart  3-9 Units Subcutaneous Q4H   insulin detemir  5 Units Subcutaneous Q12H   ipratropium  0.5 mg Nebulization Q4H   loratadine  10 mg Per Tube Daily   methylPREDNISolone (SOLU-MEDROL) injection  80 mg Intravenous Q4H   montelukast  10 mg Per Tube QHS   mouth rinse  15 mL Mouth Rinse Q2H   pantoprazole (PROTONIX) IV  40 mg Intravenous QHS   polyethylene glycol  17 g Per Tube Daily   revefenacin  175 mcg Nebulization Daily   sodium chloride flush  10-40 mL Intracatheter Q12H    Infusions:  sodium chloride     sodium chloride 10 mL/hr at 02/09/22 0900   sodium chloride 10 mL/hr at 02/09/22 0900   bivalirudin (ANGIOMAX) 250 mg in sodium chloride 0.9 % 500 mL (0.5 mg/mL) infusion 0.053 mg/kg/hr (02/09/22 0900)   cisatracurium (NIMBEX) 200 mg in sodium chloride 0.9 % 100 mL (2 mg/mL) infusion Stopped (02/09/22 0825)   fentaNYL infusion INTRAVENOUS 250 mcg/hr (02/09/22 0900)   ketamine (KETALAR) adult infusion 0.4 mg/kg/hr (02/09/22 0936)   meropenem (MERREM) IV Stopped (02/09/22 0549)   norepinephrine (LEVOPHED) Adult infusion Stopped (02/08/22 0755)   propofol (DIPRIVAN) infusion 50 mcg/kg/min (02/09/22 0900)   vancomycin 200 mL/hr at 02/09/22 0900    PRN Medications: Place/Maintain arterial line **AND** sodium chloride, sodium chloride, docusate, fentaNYL, hydrALAZINE, ondansetron (ZOFRAN) IV, mouth rinse, polyethylene glycol, sodium chloride flush     Assessment/Plan   1. Acute hypoxic/hypercarbic respiratory failure in setting of status asthmaticus and rhinovirus - cannulated for VV ECMO 02/08/22 - Continue nebs/steroids -  Continue bival. PTT 49 Discussed dosing with PharmD personally. - Still tight but improving - Continue ECMO today - wean paralytics - Diurese gently  2. Hypokalemia - resolved  3. HTN - now controlled  4. Acute blood loss anemia in setting of ECMO - transfuse hgb < 7.0   CRITICAL CARE Performed by: Glori Bickers  Total critical care time: 50 minutes  Critical care time was exclusive of separately billable procedures and treating other patients.  Critical care was necessary to treat or prevent imminent or life-threatening deterioration.  Critical care was time spent personally by me (independent of midlevel providers or residents) on the following activities: development of treatment plan with patient and/or surrogate as well as nursing, discussions with consultants, evaluation of patient's response to treatment, examination of patient, obtaining history from patient or surrogate, ordering and performing treatments and interventions, ordering and review of laboratory studies, ordering and review of radiographic studies, pulse oximetry and re-evaluation of patient's condition.   Length of Stay: 2  Glori Bickers, MD  02/09/2022, 9:43 AM  Advanced Heart Failure Team Pager [phone] (M-F; 7a - 5p)  Please contact [facility] for night-coverage after hours (5p -7a ) and weekends on [email or web address]

## 2022-02-09 NOTE — CV Procedure (Signed)
ECMO NOTE:   Indication: Respiratory failure due to status asthmaticus   Initial cannulation date: 02/08/22   ECMO type: VV ECMO   Dual lumen inflow/return cannula:   1) 16F [place] placed RIJ   ECMO events:   - Initial cannulation 02/08/22 @ 12am     Daily data:   Flow 4.4L RPM 3320 Sweep  3L   Labs:   ABG    Component Value Date/Time   PHART 7.400 02/09/2022 0342   PCO2ART 39.1 02/09/2022 0342   PO2ART 144 (H) 02/09/2022 0342   HCO3 24.4 02/09/2022 0342   TCO2 26 02/09/2022 0342   ACIDBASEDEF 1.0 02/09/2022 0342   O2SAT 99 02/09/2022 0342    Hgb 7.8 Platelets 170 LDH 218 PTT 49 Lactic acid 0.7   Plan:  Continue ECMO support with lung protective vent settings. Wean paralytics today. Continue nebs/steroids. Hopefully can come off ECMO in 24-48 hours.      Discussed in multidisciplinary fashion on ECMO rounds with CCM, Cardiology, ECMO coordinator/specialist, RT, PharmD and nursing staff all present.  Glori Bickers, MD  9:57 AM

## 2022-02-09 NOTE — Progress Notes (Signed)
NAME:  Kaitlyn Mcneil, MRN:  [id number], DOB:  01/18/1995, LOS: 2 ADMISSION DATE:  02/07/2022, CONSULTATION DATE:  02/07/22 REFERRING MD:  Zenia Resides, CHIEF COMPLAINT:  dyspnea   History of Present Illness:  26yF with history of asthma, AR, eczema who presented today with acute dyspnea. Tried home nebulizers/rescue inhalers without relief. Has been around others with URIs. Has had fever, cough. Reportedly hospitalized in 12/2021 for asthma exacerbation.   In ED she was given continuous neb, steroids, IV magnesium and placed on BiPAP. She continued to have remarkably increased work of breathing, fatigue and decision made with Dr. Zenia Resides to intubate. Required a great deal of sedation and neuromuscular blockade to achieve adequate minute ventilation. After sedation and NMB on PRVC 400/14/5 peaks 40-55, plateau 20-25, tidal volumes ~400. Intrinsic peep of 13.  Pertinent  Medical History  Asthma  AR  Eczema  Significant Hospital Events: Including procedures, antibiotic start and stop dates in addition to other pertinent events   02/07/22 intubated, deep sedation, nimbex  Interim History / Subjective:  Minimal response to lasix yesterday. Less OGT output.  Objective   Blood pressure 138/68, pulse 84, temperature (!) 97.3 F (36.3 C), resp. rate 16, height 5' (1.524 m), weight 77.5 kg, SpO2 100 %. CVP:  [11 mmHg-15 mmHg] 15 mmHg  Vent Mode: PRVC FiO2 (%):  [40 %] 40 % Set Rate:  [12 bmp-16 bmp] 16 bmp Vt Set:  [270 mL] 270 mL PEEP:  [10 cmH20] 10 cmH20 Plateau Pressure:  [22 cmH20-25 cmH20] 22 cmH20   Intake/Output Summary (Last 24 hours) at 02/09/2022 0804 Last data filed at 02/09/2022 0800 Gross per 24 hour  Intake 5451.55 ml  Output 3155 ml  Net 2296.55 ml    Filed Weights   02/07/22 1800 02/08/22 0515 02/09/22 0356  Weight: 71.1 kg 75.9 kg 77.5 kg    I/O + 2.4L  Examination: General: critically ill appearing woman lying in bed in NAD, intubated, sedated, under NMB [place]/AT:  [place]/AT,  eyes anicteric Neck: RIJ crescent cannula Lungs: Worse wheezing- inspiratory and expiratory. Less obstruction on the vent. Pplat 21, Peak 26 on rate 18 CV: S1S2, RRR Abdomen: soft, NT Extremities: no cyanosis, mild edema Skin: warm, dry, no rashes Neuro: RASS -5, PERRL  7.4/ 39/144/24 LA 0.7 RVP +rhinovirus/enterovirus Blood cultures NGTD  LDH 218 Ptt 48 INR 1.4  WBC 27.5 H/H  7.8/23.9  K+ 4.8 Bicarb 24 BUN 6 Cr 0.68  CXR personally reviewed>  RLL infiltrate, ECMO cannula, ETT in appropriate position  Resolved Hospital Problem list   Hypertensive emergency,resolved     Assessment & Plan:   Acute hypoxic and hypercapnic respiratory failure due to severe acute asthma exacerbation -Con't VV ECMO +  bival, goal PTT 50-80 - Con't MV, titrating up RR without auto-PEEP -stop NMB, keep heavy sedation for now -con't bronchodilators- brovana, yupelri, ICS, atrovent PRN. Holding albuterol for now due to previous significant lactic acidosis from albuterol. -con't high dose steroids; aggressive PUD prophylaxis -start ketamine for bronchodilator effect today -claritin, singulair -diuresis -Needs improved OP asthma regimen; will need community health and wellness referral at discharge. Inability to afford inhalers has contributed to her poor asthma control -Likely needs evaluation as an outpatient for biologics if she is not controlled on long-acting inhalers  Hypotension due to sedation; resolved -Con't empiric antibiotics -norepi PRN to maintain MAP >65  Lactic acidosis, most likely side effect of aggressive albuterol -monitor per protocol -con't aggressive BD regimen without albuterol  Hypokalemia due to albuterol  and bicarb- resolved -monitor  Hypophosphatemia, resolved -monitor -start TF  Hyperglycemia due to steroids -detemir 5 units -SSI PRN -goal BG 140-180 -will likely not need insulin once off high dose steroids  Acute blood loss anemia due to  ECMO -Transfuse for Hb <7 or hemodynamically significant bleeding; no issues oxygenating or running ECMO flows. -monitor for beleding  At risk for malnutrition -start trickle feeds -cortrak   No family at bedside, will update mom when she is here later. Multidisciplinary rounds with ECMO team.  Best Practice (right click and "Reselect all SmartList Selections" daily)   Diet/type: tubefeeds DVT prophylaxis: other- bival GI prophylaxis: H2B and PPI Lines: Central line, Arterial Line, and yes and it is still needed Foley:  Yes, and it is still needed Code Status:  full code Last date of multidisciplinary goals of care discussion [ ]   Labs   CBC: Recent Labs  Lab 02/07/22 1516 02/07/22 1904 02/08/22 0129 02/08/22 0133 02/08/22 0313 02/08/22 0405 02/08/22 1702 02/08/22 2008 02/08/22 2108 02/09/22 0011 02/09/22 0342 02/09/22 0345  WBC 16.7*  --  25.3*  --  29.3*  --  19.6*  --   --   --   --  27.5*  NEUTROABS 13.9*  --   --   --   --   --   --   --   --   --   --   --   HGB 12.7   < > 9.0*   < > 9.0*   < > 7.4* 8.2* 8.2* 8.2* 7.8* 7.8*  HCT 38.7   < > 28.5*   < > 28.2*   < > 23.2* 24.0* 24.0* 24.0* 23.0* 23.9*  MCV 83.0  --  87.4  --  86.2  --  85.3  --   --   --   --  83.9  PLT 283  --  200  --  225  --  172  --   --   --   --  170   < > = values in this interval not displayed.     Basic Metabolic Panel: Recent Labs  Lab 02/07/22 1516 02/07/22 1904 02/08/22 0307 02/08/22 0405 02/08/22 1702 02/08/22 1704 02/08/22 1754 02/08/22 2008 02/08/22 2108 02/09/22 0011 02/09/22 0342 02/09/22 0345  NA 135   < > 142  140   < > 143  --   --  144 142 142 141 140  K 3.1*   < > 3.0*  2.8*   < > 3.6  --   --  3.9 4.1 4.6 4.9 4.8  CL 101  --  103  101  --  112*  --   --   --   --   --   --  109  CO2 22  --  14*  18*  --  25  --   --   --   --   --   --  24  GLUCOSE 96  --  324*  327*  --  90  --   --   --   --   --   --  122*  BUN 6  --  5*  6  --  5*  --   --   --    --   --   --  6  CREATININE 0.67  --  1.08*  1.02*  --  0.63  --   --   --   --   --   --  0.68  CALCIUM 9.2  --  7.2*  7.0*  --  7.7*  --   --   --   --   --   --  8.0*  MG  --   --  2.4  --   --  2.3  --   --   --   --   --  2.3  PHOS  --   --  1.5*  --   --   --  1.6*  --   --   --   --  3.7   < > = values in this interval not displayed.    GFR: Estimated Creatinine Clearance: 98.9 mL/min (by C-G formula based on SCr of 0.68 mg/dL). Recent Labs  Lab 02/07/22 1843 02/07/22 2124 02/08/22 0129 02/08/22 0131 02/08/22 0313 02/08/22 0610 02/08/22 1702 02/08/22 1703 02/09/22 0345  PROCALCITON <0.10  --   --   --   --   --   --   --   --   WBC  --   --  25.3*  --  29.3*  --  19.6*  --  27.5*  LATICACIDVEN 1.1   < >  --    < > >9.0* >9.0*  --  0.7 0.7   < > = values in this interval not displayed.     Liver Function Tests: Recent Labs  Lab 02/08/22 0307 02/09/22 0345  AST 46*  43* 30  ALT 16  17 14   ALKPHOS 49  49 41  BILITOT 0.2*  0.3 0.2*  PROT 5.7*  5.6* 5.2*  ALBUMIN 3.0*  3.2* 3.0*     This patient is critically ill with multiple organ system failure which requires frequent high complexity decision making, assessment, support, evaluation, and titration of therapies. This was completed through the application of advanced monitoring technologies and extensive interpretation of multiple databases. During this encounter critical care time was devoted to patient care services described in this note for 48 minutes.  Julian Hy, DO 02/09/22 10:00 AM [facility]  For contact information, see Amion. If no response to pager, please call PCCM consult pager. After hours, 7PM- 7AM, please call Elink.

## 2022-02-09 NOTE — Progress Notes (Signed)
ECMO Sweep Trial   A sweep trial was started at 1426 per Dr. Carlis Abbott.  Vent settings:  FiO2:  100% PEEP:  10  The following trial parameters have been discussed:  pH > 7.65, pCO2 < 45, pO2 > 70, SpO2 > 88  Sweep trial failed, and oxygen was returned to the circuit at a sweep of 1.5 LPM.  ECMO physician notified.    ABG before sweep trial.   Latest Reference Range & Units 02/09/22 13:48  pH, Arterial 7.35 - 7.45  7.397  pCO2 arterial 32 - 48 mmHg 40.9  pO2, Arterial 83 - 108 mmHg 180 (H)  TCO2 22 - 32 mmol/L 27  Acid-Base Excess 0.0 - 2.0 mmol/L 0.0  Bicarbonate 20.0 - 28.0 mmol/L 25.3  O2 Saturation % 100  Patient temperature  36.6 C  (H): Data is abnormally high   ABG at 5 minutes into sweep trial.   Latest Reference Range & Units 02/09/22 14:33  Sample type  ARTERIAL  pH, Arterial 7.35 - 7.45  7.254 (L)  pCO2 arterial 32 - 48 mmHg 64.2 (H)  pO2, Arterial 83 - 108 mmHg 112 (H)  TCO2 22 - 32 mmol/L 31  Acid-Base Excess 0.0 - 2.0 mmol/L 1.0  Bicarbonate 20.0 - 28.0 mmol/L 28.5 (H)  O2 Saturation % 97  Patient temperature  36.6 C  (L): Data is abnormally low (H): Data is abnormally high

## 2022-02-09 NOTE — Progress Notes (Signed)
ANTICOAGULATION CONSULT NOTE  Pharmacy Consult for Bivalirudin Indication: VV Ecmo  Allergies  Allergen Reactions   Shellfish Allergy Swelling    Throat, facial swelling   Tomato Itching and Other (See Comments)    Eczema flares    Patient Measurements: Height: 5' (152.4 cm) Weight: 77.5 kg (170 lb 13.7 oz) IBW/kg (Calculated) : 45.5   Vital Signs: Temp: 97.2 F (36.2 C) (02/07 0415) Temp Source: Esophageal (02/07 0400) Pulse Rate: 92 (02/07 0415)  Labs: Recent Labs    02/07/22 1516 02/07/22 1904 02/08/22 0129 02/08/22 0133 02/08/22 0307 02/08/22 0313 02/08/22 0405 02/08/22 1702 02/08/22 1704 02/08/22 2008 02/08/22 2153 02/09/22 0011 02/09/22 0342 02/09/22 0345  HGB 12.7   < > 9.0*   < >  --  9.0*   < > 7.4*  --    < >  --  8.2* 7.8* 7.8*  HCT 38.7   < > 28.5*   < >  --  28.2*   < > 23.2*  --    < >  --  24.0* 23.0* 23.9*  PLT 283  --  200  --   --  225  --  172  --   --   --   --   --  170  APTT  --    < > 197*  --   --  100*   < >  --  52*  --  49*  --   --  49*  49*  LABPROT  --   --  17.0*  --   --  16.6*  --   --   --   --   --   --   --  17.5*  INR  --   --  1.4*  --   --  1.4*  --   --   --   --   --   --   --  1.5*  CREATININE 0.67  --   --   --  1.08*  1.02*  --   --  0.63  --   --   --   --   --   --    < > = values in this interval not displayed.     Estimated Creatinine Clearance: 98.9 mL/min (by C-G formula based on SCr of 0.63 mg/dL).   Medical History: Past Medical History:  Diagnosis Date   Asthma     Medications:  Medications Prior to Admission  Medication Sig Dispense Refill Last Dose   albuterol (VENTOLIN HFA) 108 (90 Base) MCG/ACT inhaler Inhale 1-2 puffs into the lungs every 6 (six) hours as needed for wheezing or shortness of breath.   02/07/2022   ALBUTEROL IN Inhale 1 vial into the lungs every 6 (six) hours as needed (wheezing, shortness of breath).   UNK   triamcinolone ointment (KENALOG) 0.1 % Apply 1 Application topically  daily as needed (eczema flares).   UNK    Assessment:  26yF with history of asthma remained severely hypercarbic despite maximum intervention. The decision was made to initiate VV ECMO overnight. Pharmacy dosing bivalirudin.  aPTT subtherapeutic (49) again on bivalirudin 0.04 mg/kg/hr. No concerns for bleeding.  Goal of Therapy:  aPTT 50-80 seconds Monitor platelets by anticoagulation protocol: Yes   Plan:  Increase bivalirudin to 0.053mg /kg/h Recheck aPTT in 4h  Onnie Boer, PharmD, [place], AAHIVP, CPP Infectious Disease Pharmacist 02/09/2022 4:30 AM

## 2022-02-10 ENCOUNTER — Inpatient Hospital Stay (HOSPITAL_COMMUNITY): Payer: BC Managed Care – PPO

## 2022-02-10 ENCOUNTER — Encounter (HOSPITAL_COMMUNITY): Payer: Self-pay | Admitting: Internal Medicine

## 2022-02-10 DIAGNOSIS — J4552 Severe persistent asthma with status asthmaticus: Secondary | ICD-10-CM

## 2022-02-10 DIAGNOSIS — D649 Anemia, unspecified: Secondary | ICD-10-CM

## 2022-02-10 LAB — POCT I-STAT 7, (LYTES, BLD GAS, ICA,H+H)
Acid-Base Excess: 1 mmol/L (ref 0.0–2.0)
Acid-Base Excess: 3 mmol/L — ABNORMAL HIGH (ref 0.0–2.0)
Acid-Base Excess: 3 mmol/L — ABNORMAL HIGH (ref 0.0–2.0)
Acid-Base Excess: 4 mmol/L — ABNORMAL HIGH (ref 0.0–2.0)
Acid-Base Excess: 3 mmol/L — ABNORMAL HIGH (ref 0.0–2.0)
Acid-Base Excess: 2 mmol/L (ref 0.0–2.0)
Acid-Base Excess: 3 mmol/L — ABNORMAL HIGH (ref 0.0–2.0)
Acid-Base Excess: 3 mmol/L — ABNORMAL HIGH (ref 0.0–2.0)
Bicarbonate: 25.8 mmol/L (ref 20.0–28.0)
Bicarbonate: 27.4 mmol/L (ref 20.0–28.0)
Bicarbonate: 27 mmol/L (ref 20.0–28.0)
Bicarbonate: 28.3 mmol/L — ABNORMAL HIGH (ref 20.0–28.0)
Bicarbonate: 28.4 mmol/L — ABNORMAL HIGH (ref 20.0–28.0)
Bicarbonate: 27.9 mmol/L (ref 20.0–28.0)
Bicarbonate: 31.2 mmol/L — ABNORMAL HIGH (ref 20.0–28.0)
Bicarbonate: 30.1 mmol/L — ABNORMAL HIGH (ref 20.0–28.0)
Calcium, Ion: 1.13 mmol/L — ABNORMAL LOW (ref 1.15–1.40)
Calcium, Ion: 1.2 mmol/L (ref 1.15–1.40)
Calcium, Ion: 1.2 mmol/L (ref 1.15–1.40)
Calcium, Ion: 1.21 mmol/L (ref 1.15–1.40)
Calcium, Ion: 1.23 mmol/L (ref 1.15–1.40)
Calcium, Ion: 1.26 mmol/L (ref 1.15–1.40)
Calcium, Ion: 1.23 mmol/L (ref 1.15–1.40)
Calcium, Ion: 1.25 mmol/L (ref 1.15–1.40)
HCT: 23 % — ABNORMAL LOW (ref 36.0–46.0)
HCT: 24 % — ABNORMAL LOW (ref 36.0–46.0)
HCT: 24 % — ABNORMAL LOW (ref 36.0–46.0)
HCT: 24 % — ABNORMAL LOW (ref 36.0–46.0)
HCT: 25 % — ABNORMAL LOW (ref 36.0–46.0)
HCT: 25 % — ABNORMAL LOW (ref 36.0–46.0)
HCT: 31 % — ABNORMAL LOW (ref 36.0–46.0)
HCT: 30 % — ABNORMAL LOW (ref 36.0–46.0)
Hemoglobin: 7.8 g/dL — ABNORMAL LOW (ref 12.0–15.0)
Hemoglobin: 8.2 g/dL — ABNORMAL LOW (ref 12.0–15.0)
Hemoglobin: 8.2 g/dL — ABNORMAL LOW (ref 12.0–15.0)
Hemoglobin: 8.2 g/dL — ABNORMAL LOW (ref 12.0–15.0)
Hemoglobin: 8.5 g/dL — ABNORMAL LOW (ref 12.0–15.0)
Hemoglobin: 8.5 g/dL — ABNORMAL LOW (ref 12.0–15.0)
Hemoglobin: 10.5 g/dL — ABNORMAL LOW (ref 12.0–15.0)
Hemoglobin: 10.2 g/dL — ABNORMAL LOW (ref 12.0–15.0)
O2 Saturation: 99 %
O2 Saturation: 99 %
O2 Saturation: 88 %
O2 Saturation: 99 %
O2 Saturation: 96 %
O2 Saturation: 98 %
O2 Saturation: 95 %
O2 Saturation: 95 %
Patient temperature: 36.8
Patient temperature: 36.7
Patient temperature: 98.2
Patient temperature: 98.2
Patient temperature: 98.7
Patient temperature: 98.7
Patient temperature: 97.9
Patient temperature: 97.9
Potassium: 3.9 mmol/L (ref 3.5–5.1)
Potassium: 3.9 mmol/L (ref 3.5–5.1)
Potassium: 4.1 mmol/L (ref 3.5–5.1)
Potassium: 4.1 mmol/L (ref 3.5–5.1)
Potassium: 3.9 mmol/L (ref 3.5–5.1)
Potassium: 4 mmol/L (ref 3.5–5.1)
Potassium: 4.4 mmol/L (ref 3.5–5.1)
Potassium: 4.3 mmol/L (ref 3.5–5.1)
Sodium: 145 mmol/L (ref 135–145)
Sodium: 145 mmol/L (ref 135–145)
Sodium: 146 mmol/L — ABNORMAL HIGH (ref 135–145)
Sodium: 146 mmol/L — ABNORMAL HIGH (ref 135–145)
Sodium: 146 mmol/L — ABNORMAL HIGH (ref 135–145)
Sodium: 147 mmol/L — ABNORMAL HIGH (ref 135–145)
Sodium: 147 mmol/L — ABNORMAL HIGH (ref 135–145)
Sodium: 147 mmol/L — ABNORMAL HIGH (ref 135–145)
TCO2: 27 mmol/L (ref 22–32)
TCO2: 29 mmol/L (ref 22–32)
TCO2: 28 mmol/L (ref 22–32)
TCO2: 29 mmol/L (ref 22–32)
TCO2: 30 mmol/L (ref 22–32)
TCO2: 29 mmol/L (ref 22–32)
TCO2: 33 mmol/L — ABNORMAL HIGH (ref 22–32)
TCO2: 32 mmol/L (ref 22–32)
pCO2 arterial: 39.3 mmHg (ref 32–48)
pCO2 arterial: 41.2 mmHg (ref 32–48)
pCO2 arterial: 36.5 mmHg (ref 32–48)
pCO2 arterial: 39 mmHg (ref 32–48)
pCO2 arterial: 45.2 mmHg (ref 32–48)
pCO2 arterial: 48.9 mmHg — ABNORMAL HIGH (ref 32–48)
pCO2 arterial: 66.6 mmHg (ref 32–48)
pCO2 arterial: 60.8 mmHg — ABNORMAL HIGH (ref 32–48)
pH, Arterial: 7.424 (ref 7.35–7.45)
pH, Arterial: 7.43 (ref 7.35–7.45)
pH, Arterial: 7.467 — ABNORMAL HIGH (ref 7.35–7.45)
pH, Arterial: 7.476 — ABNORMAL HIGH (ref 7.35–7.45)
pH, Arterial: 7.406 (ref 7.35–7.45)
pH, Arterial: 7.365 (ref 7.35–7.45)
pH, Arterial: 7.276 — ABNORMAL LOW (ref 7.35–7.45)
pH, Arterial: 7.301 — ABNORMAL LOW (ref 7.35–7.45)
pO2, Arterial: 133 mmHg — ABNORMAL HIGH (ref 83–108)
pO2, Arterial: 126 mmHg — ABNORMAL HIGH (ref 83–108)
pO2, Arterial: 139 mmHg — ABNORMAL HIGH (ref 83–108)
pO2, Arterial: 50 mmHg — ABNORMAL LOW (ref 83–108)
pO2, Arterial: 81 mmHg — ABNORMAL LOW (ref 83–108)
pO2, Arterial: 120 mmHg — ABNORMAL HIGH (ref 83–108)
pO2, Arterial: 88 mmHg (ref 83–108)
pO2, Arterial: 85 mmHg (ref 83–108)

## 2022-02-10 LAB — LACTIC ACID, PLASMA
Lactic Acid, Venous: 0.8 mmol/L (ref 0.5–1.9)
Lactic Acid, Venous: 0.9 mmol/L (ref 0.5–1.9)

## 2022-02-10 LAB — LACTATE DEHYDROGENASE: LDH: 246 U/L — ABNORMAL HIGH (ref 98–192)

## 2022-02-10 LAB — FIBRINOGEN: Fibrinogen: 294 mg/dL (ref 210–475)

## 2022-02-10 LAB — CBC
HCT: 23.9 % — ABNORMAL LOW (ref 36.0–46.0)
HCT: 31.1 % — ABNORMAL LOW (ref 36.0–46.0)
Hemoglobin: 7.9 g/dL — ABNORMAL LOW (ref 12.0–15.0)
Hemoglobin: 9.7 g/dL — ABNORMAL LOW (ref 12.0–15.0)
MCH: 28 pg (ref 26.0–34.0)
MCH: 27.2 pg (ref 26.0–34.0)
MCHC: 33.1 g/dL (ref 30.0–36.0)
MCHC: 31.2 g/dL (ref 30.0–36.0)
MCV: 84.8 fL (ref 80.0–100.0)
MCV: 87.1 fL (ref 80.0–100.0)
Platelets: 158 10*3/uL (ref 150–400)
Platelets: 167 10*3/uL (ref 150–400)
RBC: 2.82 MIL/uL — ABNORMAL LOW (ref 3.87–5.11)
RBC: 3.57 MIL/uL — ABNORMAL LOW (ref 3.87–5.11)
RDW: 17.2 % — ABNORMAL HIGH (ref 11.5–15.5)
RDW: 16.4 % — ABNORMAL HIGH (ref 11.5–15.5)
WBC: 26.6 10*3/uL — ABNORMAL HIGH (ref 4.0–10.5)
WBC: 27.9 10*3/uL — ABNORMAL HIGH (ref 4.0–10.5)
nRBC: 0 % (ref 0.0–0.2)
nRBC: 0.1 % (ref 0.0–0.2)

## 2022-02-10 LAB — HEPATIC FUNCTION PANEL
ALT: 20 U/L (ref 0–44)
AST: 38 U/L (ref 15–41)
Albumin: 3.2 g/dL — ABNORMAL LOW (ref 3.5–5.0)
Alkaline Phosphatase: 40 U/L (ref 38–126)
Bilirubin, Direct: <0.1 mg/dL (ref 0.0–0.2)
Total Bilirubin: 0.2 mg/dL — ABNORMAL LOW (ref 0.3–1.2)
Total Protein: 5.6 g/dL — ABNORMAL LOW (ref 6.5–8.1)

## 2022-02-10 LAB — BASIC METABOLIC PANEL
Anion gap: 9 (ref 5–15)
Anion gap: 10 (ref 5–15)
BUN: 23 mg/dL — ABNORMAL HIGH (ref 6–20)
BUN: 29 mg/dL — ABNORMAL HIGH (ref 6–20)
CO2: 25 mmol/L (ref 22–32)
CO2: 29 mmol/L (ref 22–32)
Calcium: 8.2 mg/dL — ABNORMAL LOW (ref 8.9–10.3)
Calcium: 8.4 mg/dL — ABNORMAL LOW (ref 8.9–10.3)
Chloride: 109 mmol/L (ref 98–111)
Chloride: 108 mmol/L (ref 98–111)
Creatinine, Ser: 0.88 mg/dL (ref 0.44–1.00)
Creatinine, Ser: 0.98 mg/dL (ref 0.44–1.00)
GFR, Estimated: >60 mL/min (ref >60)
GFR, Estimated: >60 mL/min (ref >60)
Glucose, Bld: 132 mg/dL — ABNORMAL HIGH (ref 70–99)
Glucose, Bld: 119 mg/dL — ABNORMAL HIGH (ref 70–99)
Potassium: 3.8 mmol/L (ref 3.5–5.1)
Potassium: 4.3 mmol/L (ref 3.5–5.1)
Sodium: 143 mmol/L (ref 135–145)
Sodium: 147 mmol/L — ABNORMAL HIGH (ref 135–145)

## 2022-02-10 LAB — TRIGLYCERIDES: Triglycerides: 111 mg/dL (ref <150)

## 2022-02-10 LAB — GLUCOSE, CAPILLARY
Glucose-Capillary: 117 mg/dL — ABNORMAL HIGH (ref 70–99)
Glucose-Capillary: 126 mg/dL — ABNORMAL HIGH (ref 70–99)
Glucose-Capillary: 135 mg/dL — ABNORMAL HIGH (ref 70–99)
Glucose-Capillary: 137 mg/dL — ABNORMAL HIGH (ref 70–99)
Glucose-Capillary: 139 mg/dL — ABNORMAL HIGH (ref 70–99)
Glucose-Capillary: 143 mg/dL — ABNORMAL HIGH (ref 70–99)

## 2022-02-10 LAB — PROTIME-INR
INR: 1.4 — ABNORMAL HIGH (ref 0.8–1.2)
Prothrombin Time: 16.7 seconds — ABNORMAL HIGH (ref 11.4–15.2)

## 2022-02-10 LAB — APTT: aPTT: 45 seconds — ABNORMAL HIGH (ref 24–36)

## 2022-02-10 MED ORDER — SODIUM CHLORIDE 0.9% IV SOLUTION: Freq: Once | INTRAVENOUS | Status: DC

## 2022-02-10 MED ORDER — POTASSIUM CHLORIDE 10 MEQ/50ML IV SOLN
10.0000 meq | INTRAVENOUS | Status: AC
  Administered 2022-02-10 (×4): 10 meq via INTRAVENOUS
  Filled 2022-02-10: qty 50

## 2022-02-10 MED ORDER — FUROSEMIDE 10 MG/ML IJ SOLN
20.0000 mg | Freq: Three times a day (TID) | INTRAMUSCULAR | Status: AC
  Administered 2022-02-10 (×3): 20 mg via INTRAVENOUS
  Filled 2022-02-10 (×3): qty 2

## 2022-02-10 MED ORDER — NICARDIPINE HCL IN NACL 20-0.86 MG/200ML-% IV SOLN
3.0000 mg/h | INTRAVENOUS | Status: DC
  Administered 2022-02-10 – 2022-02-11 (×5): 5 mg/h via INTRAVENOUS
  Filled 2022-02-10 (×2): qty 200
  Filled 2022-02-10: qty 400
  Filled 2022-02-10 (×2): qty 200

## 2022-02-10 MED ORDER — FENTANYL BOLUS VIA INFUSION: 30.0000 ug | INTRAVENOUS | Status: DC | PRN

## 2022-02-10 MED ORDER — FENTANYL BOLUS VIA INFUSION
50.0000 ug | INTRAVENOUS | Status: DC | PRN
  Administered 2022-02-10 – 2022-02-16 (×15): 50 ug via INTRAVENOUS

## 2022-02-10 NOTE — CV Procedure (Signed)
ECMO NOTE:   Indication: Respiratory failure due to status asthmaticus   Initial cannulation date: 02/08/22   ECMO type: VV ECMO   Dual lumen inflow/return cannula:   1) 80F [place] placed RIJ   ECMO events:   - Initial cannulation 02/08/22 @ 12am     Daily data:   Flow 4.14L RPM 3150 Sweep  1L SVo2 82% deltaP 21 Pven -70   Labs:   ABG    Component Value Date/Time   PHART 7.430 02/10/2022 0406   PCO2ART 41.2 02/10/2022 0406   PO2ART 126 (H) 02/10/2022 0406   HCO3 27.4 02/10/2022 0406   TCO2 29 02/10/2022 0406   ACIDBASEDEF 1.0 02/09/2022 2335   O2SAT 99 02/10/2022 0406    Hgb 7.9 Platelets 158 LDH 246 PTT 45 Lactic acid 0.7   Plan:  Failed sweep trial yesterday. Lungs sound better today. Will try to wean VV ECMO again today. \     Discussed in multidisciplinary fashion on ECMO rounds with CCM, Cardiology, ECMO coordinator/specialist, RT, PharmD and nursing staff all present.  Glori Bickers, MD  8:31 AM

## 2022-02-10 NOTE — Progress Notes (Signed)
NAME:  Kaitlyn Mcneil, MRN:  [id number], DOB:  1995-04-10, LOS: 3 ADMISSION DATE:  02/07/2022, CONSULTATION DATE:  02/07/22 REFERRING MD:  Zenia Resides, CHIEF COMPLAINT:  dyspnea   History of Present Illness:  26yF with history of asthma, AR, eczema who presented today with acute dyspnea. Tried home nebulizers/rescue inhalers without relief. Has been around others with URIs. Has had fever, cough. Reportedly hospitalized in 12/2021 for asthma exacerbation.   In ED she was given continuous neb, steroids, IV magnesium and placed on BiPAP. She continued to have remarkably increased work of breathing, fatigue and decision made with Dr. Zenia Resides to intubate. Required a great deal of sedation and neuromuscular blockade to achieve adequate minute ventilation. After sedation and NMB on PRVC 400/14/5 peaks 40-55, plateau 20-25, tidal volumes ~400. Intrinsic peep of 13.  Pertinent  Medical History  Asthma  AR  Eczema  Significant Hospital Events: Including procedures, antibiotic start and stop dates in addition to other pertinent events   02/07/22 intubated, deep sedation, nimbex  Interim History / Subjective:  Overnight had some abdominal distention and frequent suctioning out of he stomach.  Objective   Blood pressure 134/62, pulse 88, temperature 98.1 F (36.7 C), temperature source Core, resp. rate 18, height 5' (1.524 m), weight 76.6 kg, SpO2 100 %. CVP:  [11 mmHg-15 mmHg] 11 mmHg  Vent Mode: PRVC FiO2 (%):  [40 %] 40 % Set Rate:  [18 bmp-20 bmp] 18 bmp Vt Set:  [300 mL-360 mL] 360 mL PEEP:  [9 cmH20-10 cmH20] 10 cmH20 Plateau Pressure:  [24 cmH20-28 cmH20] 25 cmH20   Intake/Output Summary (Last 24 hours) at 02/10/2022 0749 Last data filed at 02/10/2022 0700 Gross per 24 hour  Intake 3523.14 ml  Output 3820 ml  Net -296.86 ml    Filed Weights   02/08/22 0515 02/09/22 0356 02/10/22 0345  Weight: 75.9 kg 77.5 kg 76.6 kg    I/O +3.3L for admission  Examination: General: critically ill  appearing woman lying in bed in NAD [place]/AT: /AT, eyes anicteric Neck: RIJ crescent cannula Lungs: wheezing resolved, Pplat 24, DP 14, EEP 10, no air trapping.  CV: S1S2, RRR Abdomen: soft, NT Extremities: no significant edema, no cyanosis or clubbing Skin: warm, dry, no rashes Neuro: RASS -5, PERRL  7.43/ 41/126/27 LA 0.8 RVP +rhinovirus/enterovirus Blood cultures NGTD Resp culture: pending  LDH 246 Ptt 45 INR 1.4  WBC 26.6 H/H  7.9/23.9  K+ 3.8 Bicarb 25 BUN 23 Cr 0.88  CXR personally reviewed> bibasilar infiltrates vs effusions, ETT 4.5 cm above carina.   Resolved Hospital Problem list   Hypertensive emergency,resolved   Hypotension due to sedation Lactic acidosis, most likely side effect of aggressive albuterol Hypophosphatemia  Assessment & Plan:   Acute hypoxic and hypercapnic respiratory failure due to severe acute asthma exacerbation trigger by rhinovirus infection -Con't VV ECMO , weaning trial again today -bival for AC, goal PTT 50-80 -Con't MV, tolerating rate increase today. -off NMB -diuresis to prevent significant hypervolemia -con't aggressive steroids -con't bronchodilators- pulmicort, yupelri, brovana, atrovent q4h -singulair, claritin -ketamine for BD effect -Needs improved OP control; will need referral to community health and wellness before discharge for inhalers. May need evaluation for biologics. -advance ETT 2cm -avoiding albuterol where able due to lactic acidosis; likely would be fine for PRN use  Hypertension- likely due to med side effects -no Bblockers -hydralazine PRN  Hypokalemia due to albuterol and bicarb- resolved -replete today -con't to monitor  Hyperglycemia due to steroids -detemir 5 units BID -  SSI PRN -goal BG 140-180 -will likely not need insulin once off high dose steroids  Acute blood loss anemia due to ECMO -transfuse for Hb <7 or hemodynamically significant bleeding since no flow or oxygenation  issues -monitor for bleeding  At risk for malnutrition; has had minimal enteral intake over this admission due to likely ileus -trickle feeds; hold if she gets too distended   No family present at bedside, will update when present. Multidisciplinary rounds completed.   Best Practice (right click and "Reselect all SmartList Selections" daily)   Diet/type: tubefeeds DVT prophylaxis: other- bival GI prophylaxis: H2B and PPI Lines: Central line, Arterial Line, and yes and it is still needed Foley:  Yes, and it is still needed Code Status:  full code Last date of multidisciplinary goals of care discussion [ ]   Labs   CBC: Recent Labs  Lab 02/07/22 1516 02/07/22 1904 02/08/22 0313 02/08/22 0405 02/08/22 1702 02/08/22 2008 02/09/22 0345 02/09/22 0932 02/09/22 1600 02/09/22 1606 02/09/22 1951 02/09/22 2335 02/10/22 0154 02/10/22 0406 02/10/22 0408  WBC 16.7*   < > 29.3*  --  19.6*  --  27.5*  --  28.1*  --   --   --   --   --  26.6*  NEUTROABS 13.9*  --   --   --   --   --   --   --   --   --   --   --   --   --   --   HGB 12.7   < > 9.0*   < > 7.4*   < > 7.8*   < > 7.6*   < > 8.5* 8.5* 7.8* 8.2* 7.9*  HCT 38.7   < > 28.2*   < > 23.2*   < > 23.9*   < > 24.2*   < > 25.0* 25.0* 23.0* 24.0* 23.9*  MCV 83.0   < > 86.2  --  85.3  --  83.9  --  86.4  --   --   --   --   --  84.8  PLT 283   < > 225  --  172  --  170  --  170  --   --   --   --   --  158   < > = values in this interval not displayed.     Basic Metabolic Panel: Recent Labs  Lab 02/08/22 0307 02/08/22 0405 02/08/22 1702 02/08/22 1704 02/08/22 1754 02/08/22 2008 02/09/22 0345 02/09/22 0932 02/09/22 1600 02/09/22 1606 02/09/22 1951 02/09/22 2335 02/10/22 0154 02/10/22 0406 02/10/22 0408  NA 142  140   < > 143  --   --    < > 140   < > 143   < > 144 144 145 145 143  K 3.0*  2.8*   < > 3.6  --   --    < > 4.8   < > 4.6   < > 4.3 4.1 3.9 3.9 3.8  CL 103  101  --  112*  --   --   --  109  --  110  --    --   --   --   --  109  CO2 14*  18*  --  25  --   --   --  24  --  27  --   --   --   --   --  25  GLUCOSE 324*  327*  --  90  --   --   --  122*  --  120*  --   --   --   --   --  132*  BUN 5*  6  --  5*  --   --   --  6  --  13  --   --   --   --   --  23*  CREATININE 1.08*  1.02*  --  0.63  --   --   --  0.68  --  0.74  --   --   --   --   --  0.88  CALCIUM 7.2*  7.0*  --  7.7*  --   --   --  8.0*  --  8.1*  --   --   --   --   --  8.2*  MG 2.4  --   --  2.3  --   --  2.3  --  2.5*  --   --   --   --   --   --   PHOS 1.5*  --   --   --  1.6*  --  3.7  --  4.8*  --   --   --   --   --   --    < > = values in this interval not displayed.    GFR: Estimated Creatinine Clearance: 89.3 mL/min (by C-G formula based on SCr of 0.88 mg/dL). Recent Labs  Lab 02/07/22 1843 02/07/22 2124 02/08/22 1702 02/08/22 1703 02/09/22 0345 02/09/22 1600 02/10/22 0408  PROCALCITON <0.10  --   --   --   --   --   --   WBC  --    < > 19.6*  --  27.5* 28.1* 26.6*  LATICACIDVEN 1.1   < >  --  0.7 0.7 0.7 0.8   < > = values in this interval not displayed.     Liver Function Tests: Recent Labs  Lab 02/08/22 0307 02/09/22 0345 02/10/22 0408  AST 46*  43* 30 38  ALT 16  17 14 20   ALKPHOS 49  49 41 40  BILITOT 0.2*  0.3 0.2* 0.2*  PROT 5.7*  5.6* 5.2* 5.6*  ALBUMIN 3.0*  3.2* 3.0* 3.2*     This patient is critically ill with multiple organ system failure which requires frequent high complexity decision making, assessment, support, evaluation, and titration of therapies. This was completed through the application of advanced monitoring technologies and extensive interpretation of multiple databases. During this encounter critical care time was devoted to patient care services described in this note for 38 minutes.  Julian Hy, DO 02/10/22 8:11 AM [facility]  For contact information, see Amion. If no response to pager, please call PCCM consult pager. After  hours, 7PM- 7AM, please call Elink.

## 2022-02-10 NOTE — Anesthesia Postprocedure Evaluation (Signed)
Anesthesia Post Note  Patient: Kaitlyn Mcneil  Procedure(s) Performed: CANNULATION FOR ECMO (EXTRACORPOREAL MEMBRANE OXYGENATION)     Patient location during evaluation: SICU Anesthesia Type: General Level of consciousness: sedated Pain management: pain level controlled Vital Signs Assessment: post-procedure vital signs reviewed and stable Respiratory status: patient remains intubated per anesthesia plan Cardiovascular status: stable Postop Assessment: no apparent nausea or vomiting Anesthetic complications: no   No notable events documented.  Last Vitals:  Vitals:   02/10/22 1000 02/10/22 1030  BP:    Pulse: (!) 106 (!) 101  Resp: 18 (!) 22  Temp:    SpO2: 95% 97%    Last Pain:  Vitals:   02/10/22 0800  TempSrc: Axillary  PainSc:                  Kaitlyn Mcneil

## 2022-02-10 NOTE — Progress Notes (Signed)
Brief Nutrition Follow-up:  Pt remains on vent support and VV ECMO. Noted possible decannulation today  Trickle TF of Vital 1.5 at 20 ml/hr initiated yesterday via gastric Cortrak. Pt with some abdominal distention over night, OG placed to suction with total of 800 mL returned (mix of TF and bile)  Upon RD rounds today, TF on hold given possible decannulation. Per RN, if decannulation does not occur today, plan is to resume trickle TF and monitor for signs of intolerance.  Noted sodium trending up, last documented at 147. Pt will likely need additional free water administration (per tube vs IV pending access)  RD to follow up for TF tolerance and ability to titrate TF towards goal  Kerman Passey MS, RDN, LDN, CNSC Registered Dietitian 3 Clinical Nutrition RD Pager and On-Call Pager Number Located in [place]

## 2022-02-10 NOTE — Progress Notes (Signed)
Advanced Heart Failure Rounding Note  PCP-Cardiologist: None   Subjective:    Remains intubated/sedated  Viral panel + for rhinovirus  Off NE. Diuresed with IV lasix. Had some abdominal bloating/residuals  ECMO 4.14L with sweep 1L  Vent 40%   7.43/41/126/99%    Hgb 7.9 LDH 249  On bival PTT 45   Objective:   Weight Range: 76.6 kg Body mass index is 32.98 kg/m.   Vital Signs:   Temp:  [96.6 F (35.9 C)-98.4 F (36.9 C)] 98.2 F (36.8 C) (02/08 0800) Pulse Rate:  [76-140] 100 (02/08 0830) Resp:  [0-24] 18 (02/08 0830) BP: (134-143)/(62-68) 143/68 (02/08 0753) SpO2:  [93 %-100 %] 100 % (02/08 0830) Arterial Line BP: (107-275)/(13-95) 145/70 (02/08 0830) FiO2 (%):  [40 %] 40 % (02/08 0753) Weight:  [76.6 kg] 76.6 kg (02/08 0345) Last BM Date : 02/07/22  Weight change: Filed Weights   02/08/22 0515 02/09/22 0356 02/10/22 0345  Weight: 75.9 kg 77.5 kg 76.6 kg    Intake/Output:   Intake/Output Summary (Last 24 hours) at 02/10/2022 0833 Last data filed at 02/10/2022 0700 Gross per 24 hour  Intake 3422.07 ml  Output 3785 ml  Net -362.93 ml       Physical Exam    General:  Intubated/sedated HEENT: normal + RIJ ECMO cannula Neck: supple. no JVD. Carotids 2+ bilat; no bruits. No lymphadenopathy or thryomegaly appreciated. Cor: PMI nondisplaced. Regular rate & rhythm. No rubs, gallops or murmurs. Lungs: no wheezing Abdomen: soft, nontender, nondistended. No hepatosplenomegaly. No bruits or masses. hypoactive bowel sounds. Extremities: no cyanosis, clubbing, rash, 1+ edema Neuro: intubated/sedated    Telemetry   Sinus 90-low 100s Personally reviewed   Labs    CBC Recent Labs    02/07/22 1516 02/07/22 1904 02/09/22 1600 02/09/22 1606 02/10/22 0406 02/10/22 0408  WBC 16.7*   < > 28.1*  --   --  26.6*  NEUTROABS 13.9*  --   --   --   --   --   HGB 12.7   < > 7.6*   < > 8.2* 7.9*  HCT 38.7   < > 24.2*   < > 24.0* 23.9*  MCV 83.0   < >  86.4  --   --  84.8  PLT 283   < > 170  --   --  158   < > = values in this interval not displayed.    Basic Metabolic Panel Recent Labs    02/09/22 0345 02/09/22 0932 02/09/22 1600 02/09/22 1606 02/10/22 0406 02/10/22 0408  NA 140   < > 143   < > 145 143  K 4.8   < > 4.6   < > 3.9 3.8  CL 109  --  110  --   --  109  CO2 24  --  27  --   --  25  GLUCOSE 122*  --  120*  --   --  132*  BUN 6  --  13  --   --  23*  CREATININE 0.68  --  0.74  --   --  0.88  CALCIUM 8.0*  --  8.1*  --   --  8.2*  MG 2.3  --  2.5*  --   --   --   PHOS 3.7  --  4.8*  --   --   --    < > = values in this interval not displayed.    Liver Function  Tests Recent Labs    02/09/22 0345 02/10/22 0408  AST 30 38  ALT 14 20  ALKPHOS 41 40  BILITOT 0.2* 0.2*  PROT 5.2* 5.6*  ALBUMIN 3.0* 3.2*    No results for input(s): "LIPASE", "AMYLASE" in the last 72 hours. Cardiac Enzymes No results for input(s): "CKTOTAL", "CKMB", "CKMBINDEX", "TROPONINI" in the last 72 hours.  BNP: BNP (last 3 results) No results for input(s): "BNP" in the last 8760 hours.  ProBNP (last 3 results) No results for input(s): "PROBNP" in the last 8760 hours.   D-Dimer No results for input(s): "DDIMER" in the last 72 hours. Hemoglobin A1C Recent Labs    02/08/22 0307  HGBA1C 5.9*    Fasting Lipid Panel Recent Labs    02/10/22 0408  TRIG 111    Thyroid Function Tests No results for input(s): "TSH", "T4TOTAL", "T3FREE", "THYROIDAB" in the last 72 hours.  Invalid input(s): "FREET3"  Other results:   Imaging    DG CHEST PORT 1 VIEW  Result Date: 02/10/2022 CLINICAL DATA:  Acute hypoxemic respiratory failure. EXAM: PORTABLE CHEST 1 VIEW COMPARISON:  Single-view of the chest 02/09/2018 for and 02/08/2022. FINDINGS: A new feeding tube is in place and courses below the inferior margin of the film. Support tubes and lines are otherwise unchanged. Bibasilar airspace disease is worse on the left and has progressed  since yesterday's exam. There are small bilateral pleural effusions. Heart size is normal. No pneumothorax. IMPRESSION: 1. New feeding tube courses below the inferior margin of the film. Support tubes and lines are otherwise unchanged. 2. Bibasilar airspace disease is worse on the left and has progressed since yesterday's exam. 3. Small bilateral pleural effusions. Electronically Signed   By: Inge Rise M.D.   On: 02/10/2022 08:15   DG Abd Portable 1V  Result Date: 02/09/2022 CLINICAL DATA:  Feeding tube placement EXAM: PORTABLE ABDOMEN - 1 VIEW COMPARISON:  02/08/2022 FINDINGS: Limited radiograph of the lower chest and upper abdomen was obtained for the purposes of enteric tube localization. New large bore enteric tube is seen coursing below the diaphragm with distal tip and terminating within the expected location of the distal gastric body. Previously seen enteric tube remains positioned within the stomach. Partially imaged ECMO cannula. IMPRESSION: New large bore enteric tube with distal tip and terminating within the expected location of the distal gastric body. Electronically Signed   By: Davina Poke D.O.   On: 02/09/2022 12:08     Medications:     Scheduled Medications:  arformoterol  15 mcg Nebulization BID   budesonide (PULMICORT) nebulizer solution  0.5 mg Nebulization BID   Chlorhexidine Gluconate Cloth  6 each Topical Daily   docusate  100 mg Per Tube BID   feeding supplement (PROSource TF20)  60 mL Per Tube QID   furosemide  20 mg Intravenous Q8H   insulin aspart  3-9 Units Subcutaneous Q4H   insulin detemir  5 Units Subcutaneous Q12H   ipratropium  0.5 mg Nebulization Q4H   loratadine  10 mg Per Tube Daily   methylPREDNISolone (SOLU-MEDROL) injection  80 mg Intravenous Q4H   montelukast  10 mg Per Tube QHS   mouth rinse  15 mL Mouth Rinse Q2H   pantoprazole (PROTONIX) IV  40 mg Intravenous QHS   polyethylene glycol  17 g Per Tube Daily   revefenacin  175 mcg  Nebulization Daily   sodium chloride flush  10-40 mL Intracatheter Q12H    Infusions:  sodium chloride  sodium chloride Stopped (02/10/22 0488)   sodium chloride 10 mL/hr at 02/10/22 0700   albumin human     bivalirudin (ANGIOMAX) 250 mg in sodium chloride 0.9 % 500 mL (0.5 mg/mL) infusion 0.053 mg/kg/hr (02/10/22 0700)   feeding supplement (VITAL 1.5 CAL) 20 mL/hr at 02/10/22 0700   fentaNYL infusion INTRAVENOUS 300 mcg/hr (02/10/22 0700)   ketamine (KETALAR) adult infusion 0.4 mg/kg/hr (02/10/22 0700)   meropenem (MERREM) IV Stopped (02/10/22 0538)   potassium chloride 10 mEq (02/10/22 0824)   propofol (DIPRIVAN) infusion 80 mcg/kg/min (02/10/22 0700)    PRN Medications: Place/Maintain arterial line **AND** sodium chloride, sodium chloride, albumin human, docusate, fentaNYL, hydrALAZINE, midazolam, ondansetron (ZOFRAN) IV, mouth rinse, polyethylene glycol, sodium chloride flush     Assessment/Plan   1. Acute hypoxic/hypercarbic respiratory failure in setting of status asthmaticus and rhinovirus - cannulated for VV ECMO 02/08/22 - Continue nebs/steroids - Continue bival. PTT 45 Discussed dosing with PharmD personally. - Failed ECMO wean yesterday Lungs sound much better today. Repeat sweep trial - Hopefully decannulate later today or tomorrow  2. Hypokalemia - resolved  3. HTN - now controlled  4. Acute blood loss anemia in setting of ECMO - transfuse hgb < 7.0   5. Ileus - likely due to sedatives.  - wean sedatives as tolerated  CRITICAL CARE Performed by: Glori Bickers  Total critical care time: 45 minutes  Critical care time was exclusive of separately billable procedures and treating other patients.  Critical care was necessary to treat or prevent imminent or life-threatening deterioration.  Critical care was time spent personally by me (independent of midlevel providers or residents) on the following activities: development of treatment plan with  patient and/or surrogate as well as nursing, discussions with consultants, evaluation of patient's response to treatment, examination of patient, obtaining history from patient or surrogate, ordering and performing treatments and interventions, ordering and review of laboratory studies, ordering and review of radiographic studies, pulse oximetry and re-evaluation of patient's condition.   Length of Stay: 3  Glori Bickers, MD  02/10/2022, 8:33 AM  Advanced Heart Failure Team Pager [phone] (M-F; 7a - 5p)  Please contact [facility] for night-coverage after hours (5p -7a ) and weekends on [email or web address]

## 2022-02-10 NOTE — Progress Notes (Signed)
ECMO Sweep Trial   A sweep trial was started at 0900 per Dr. Carlis Abbott.  Vent settings:  FiO2:  40 PEEP:  10  The following trial parameters have been discussed:  pH > 7.35-7.45, pCO2 < 45, pO2 > 56, SpO2 > 88  Sweep trial passed, and patient was decannulated at 1550 .  ECMO physician notified.   ABG    Latest Ref Rng & Units 02/10/2022    9:01 AM 02/10/2022    9:10 AM  ABG - Last 2 Results  PH, Arterial 7.35 - 7.45 7.476  7.467   PCO2 arterial 32 - 48 mmHg 36.5  39.0   PO2, Arterial 83 - 108 mmHg 139  50   Bicarbonate 20.0 - 28.0 mmol/L 27.0  28.3   Acid-Base Excess 0.0 - 2.0 mmol/L 3.0  4.0   O2 Saturation % 99  88

## 2022-02-10 NOTE — CV Procedure (Signed)
   Patient tolerated sweep trial all day. ABGs stable.   Site prepped. Sutures removed.   Pursestring suture placed around the site. Circuit clamped.   ECMO cannula removed. Pursestring suture tightened down with good hemostasis. Second reinforcement suture placed. Pressure held for 15 minutes.   Patient tolerated well.  Glori Bickers, MD  4:40 PM

## 2022-02-10 NOTE — Progress Notes (Signed)
ECMO circuit clamped out and Crescent removed by Dr. Haroldine Laws.

## 2022-02-11 ENCOUNTER — Inpatient Hospital Stay (HOSPITAL_COMMUNITY): Payer: BC Managed Care – PPO

## 2022-02-11 DIAGNOSIS — R0600 Dyspnea, unspecified: Secondary | ICD-10-CM

## 2022-02-11 LAB — CBC
HCT: 28.6 % — ABNORMAL LOW (ref 36.0–46.0)
Hemoglobin: 8.9 g/dL — ABNORMAL LOW (ref 12.0–15.0)
MCH: 27 pg (ref 26.0–34.0)
MCHC: 31.1 g/dL (ref 30.0–36.0)
MCV: 86.7 fL (ref 80.0–100.0)
Platelets: 155 10*3/uL (ref 150–400)
RBC: 3.3 MIL/uL — ABNORMAL LOW (ref 3.87–5.11)
RDW: 16.5 % — ABNORMAL HIGH (ref 11.5–15.5)
WBC: 23.4 10*3/uL — ABNORMAL HIGH (ref 4.0–10.5)
nRBC: 0.1 % (ref 0.0–0.2)

## 2022-02-11 LAB — TYPE AND SCREEN
ABO/RH(D): O POS
ABO/RH(D): O POS
Antibody Screen: NEGATIVE
Antibody Screen: NEGATIVE
Unit division: 0
Unit division: 0
Unit division: 0
Unit division: 0
Unit division: 0
Unit division: 0
Unit division: 0
Unit division: 0
Unit division: 0

## 2022-02-11 LAB — BPAM RBC
Blood Product Expiration Date: 202403092359
Blood Product Expiration Date: 202403092359
Blood Product Expiration Date: 202403092359
Blood Product Expiration Date: 202403092359
Blood Product Expiration Date: 202403092359
Blood Product Expiration Date: 202403092359
Blood Product Expiration Date: 202403092359
Blood Product Expiration Date: 202403092359
Blood Product Expiration Date: 202403102359
ISSUE DATE / TIME: 202402052330
ISSUE DATE / TIME: 202402052330
ISSUE DATE / TIME: 202402061548
ISSUE DATE / TIME: 202402061954
ISSUE DATE / TIME: 202402081308
ISSUE DATE / TIME: 202402081503
ISSUE DATE / TIME: 202402081503
ISSUE DATE / TIME: 202402081503
ISSUE DATE / TIME: 202402081947
Unit Type and Rh: 5100
Unit Type and Rh: 5100
Unit Type and Rh: 5100
Unit Type and Rh: 5100
Unit Type and Rh: 5100
Unit Type and Rh: 5100
Unit Type and Rh: 5100
Unit Type and Rh: 5100
Unit Type and Rh: 5100

## 2022-02-11 LAB — POCT I-STAT 7, (LYTES, BLD GAS, ICA,H+H)
Acid-Base Excess: 3 mmol/L — ABNORMAL HIGH (ref 0.0–2.0)
Acid-Base Excess: 4 mmol/L — ABNORMAL HIGH (ref 0.0–2.0)
Acid-Base Excess: 5 mmol/L — ABNORMAL HIGH (ref 0.0–2.0)
Acid-Base Excess: 6 mmol/L — ABNORMAL HIGH (ref 0.0–2.0)
Bicarbonate: 26.8 mmol/L (ref 20.0–28.0)
Bicarbonate: 29.6 mmol/L — ABNORMAL HIGH (ref 20.0–28.0)
Bicarbonate: 30.2 mmol/L — ABNORMAL HIGH (ref 20.0–28.0)
Bicarbonate: 30.4 mmol/L — ABNORMAL HIGH (ref 20.0–28.0)
Calcium, Ion: 1.18 mmol/L (ref 1.15–1.40)
Calcium, Ion: 1.21 mmol/L (ref 1.15–1.40)
Calcium, Ion: 1.22 mmol/L (ref 1.15–1.40)
Calcium, Ion: 1.23 mmol/L (ref 1.15–1.40)
HCT: 27 % — ABNORMAL LOW (ref 36.0–46.0)
HCT: 27 % — ABNORMAL LOW (ref 36.0–46.0)
HCT: 30 % — ABNORMAL LOW (ref 36.0–46.0)
HCT: 27 % — ABNORMAL LOW (ref 36.0–46.0)
Hemoglobin: 10.2 g/dL — ABNORMAL LOW (ref 12.0–15.0)
Hemoglobin: 9.2 g/dL — ABNORMAL LOW (ref 12.0–15.0)
Hemoglobin: 9.2 g/dL — ABNORMAL LOW (ref 12.0–15.0)
Hemoglobin: 9.2 g/dL — ABNORMAL LOW (ref 12.0–15.0)
O2 Saturation: 96 %
O2 Saturation: 96 %
O2 Saturation: 97 %
O2 Saturation: 97 %
Patient temperature: 97.7
Patient temperature: 98.4
Patient temperature: 98.7
Patient temperature: 97.6
Potassium: 3.6 mmol/L (ref 3.5–5.1)
Potassium: 3.7 mmol/L (ref 3.5–5.1)
Potassium: 3.8 mmol/L (ref 3.5–5.1)
Potassium: 3.1 mmol/L — ABNORMAL LOW (ref 3.5–5.1)
Sodium: 146 mmol/L — ABNORMAL HIGH (ref 135–145)
Sodium: 148 mmol/L — ABNORMAL HIGH (ref 135–145)
Sodium: 149 mmol/L — ABNORMAL HIGH (ref 135–145)
Sodium: 151 mmol/L — ABNORMAL HIGH (ref 135–145)
TCO2: 28 mmol/L (ref 22–32)
TCO2: 31 mmol/L (ref 22–32)
TCO2: 32 mmol/L (ref 22–32)
TCO2: 32 mmol/L (ref 22–32)
pCO2 arterial: 35.2 mmHg (ref 32–48)
pCO2 arterial: 39.8 mmHg (ref 32–48)
pCO2 arterial: 53 mmHg — ABNORMAL HIGH (ref 32–48)
pCO2 arterial: 43.2 mmHg (ref 32–48)
pH, Arterial: 7.364 (ref 7.35–7.45)
pH, Arterial: 7.478 — ABNORMAL HIGH (ref 7.35–7.45)
pH, Arterial: 7.49 — ABNORMAL HIGH (ref 7.35–7.45)
pH, Arterial: 7.453 — ABNORMAL HIGH (ref 7.35–7.45)
pO2, Arterial: 75 mmHg — ABNORMAL LOW (ref 83–108)
pO2, Arterial: 76 mmHg — ABNORMAL LOW (ref 83–108)
pO2, Arterial: 93 mmHg (ref 83–108)
pO2, Arterial: 87 mmHg (ref 83–108)

## 2022-02-11 LAB — HEPATIC FUNCTION PANEL
ALT: 19 U/L (ref 0–44)
AST: 25 U/L (ref 15–41)
Albumin: 3.1 g/dL — ABNORMAL LOW (ref 3.5–5.0)
Alkaline Phosphatase: 44 U/L (ref 38–126)
Bilirubin, Direct: <0.1 mg/dL (ref 0.0–0.2)
Total Bilirubin: 0.5 mg/dL (ref 0.3–1.2)
Total Protein: 5.6 g/dL — ABNORMAL LOW (ref 6.5–8.1)

## 2022-02-11 LAB — ALLERGENS W/TOTAL IGE AREA 2
Alternaria Alternata IgE: 5.21 kU/L — AB
Aspergillus Fumigatus IgE: 9.78 kU/L — AB
Bermuda Grass IgE: 3.63 kU/L — AB
Cat Dander IgE: 15.7 kU/L — AB
Cedar, Mountain IgE: 10.7 kU/L — AB
Cladosporium Herbarum IgE: 3.38 kU/L — AB
Cockroach, German IgE: >100 kU/L — AB
Common Silver Birch IgE: 10.6 kU/L — AB
Cottonwood IgE: 2.17 kU/L — AB
D Farinae IgE: >100 kU/L — AB
D Pteronyssinus IgE: >100 kU/L — AB
Dog Dander IgE: 27.3 kU/L — AB
Elm, American IgE: 4.97 kU/L — AB
IgE (Immunoglobulin E), Serum: 1063 IU/mL — ABNORMAL HIGH (ref 6–495)
Johnson Grass IgE: 3.26 kU/L — AB
Maple/Box Elder IgE: 0.98 kU/L — AB
Mouse Urine IgE: 1.55 kU/L — AB
Oak, White IgE: 3.26 kU/L — AB
Pecan, Hickory IgE: 9.73 kU/L — AB
Penicillium Chrysogen IgE: 3.16 kU/L — AB
Pigweed, Rough IgE: 1.41 kU/L — AB
Ragweed, Short IgE: 35.5 kU/L — AB
Sheep Sorrel IgE Qn: 1.65 kU/L — AB
Timothy Grass IgE: 12.9 kU/L — AB
White Mulberry IgE: 0.74 kU/L — AB

## 2022-02-11 LAB — PROTIME-INR
INR: 1.2 (ref 0.8–1.2)
Prothrombin Time: 14.8 seconds (ref 11.4–15.2)

## 2022-02-11 LAB — TRIGLYCERIDES: Triglycerides: 123 mg/dL (ref <150)

## 2022-02-11 LAB — BASIC METABOLIC PANEL
Anion gap: 5 (ref 5–15)
BUN: 32 mg/dL — ABNORMAL HIGH (ref 6–20)
CO2: 30 mmol/L (ref 22–32)
Calcium: 8.1 mg/dL — ABNORMAL LOW (ref 8.9–10.3)
Chloride: 110 mmol/L (ref 98–111)
Creatinine, Ser: 0.87 mg/dL (ref 0.44–1.00)
GFR, Estimated: >60 mL/min (ref >60)
Glucose, Bld: 132 mg/dL — ABNORMAL HIGH (ref 70–99)
Potassium: 3.8 mmol/L (ref 3.5–5.1)
Sodium: 145 mmol/L (ref 135–145)

## 2022-02-11 LAB — GLUCOSE, CAPILLARY
Glucose-Capillary: 107 mg/dL — ABNORMAL HIGH (ref 70–99)
Glucose-Capillary: 118 mg/dL — ABNORMAL HIGH (ref 70–99)
Glucose-Capillary: 119 mg/dL — ABNORMAL HIGH (ref 70–99)
Glucose-Capillary: 128 mg/dL — ABNORMAL HIGH (ref 70–99)
Glucose-Capillary: 131 mg/dL — ABNORMAL HIGH (ref 70–99)
Glucose-Capillary: 139 mg/dL — ABNORMAL HIGH (ref 70–99)

## 2022-02-11 LAB — FIBRINOGEN: Fibrinogen: 278 mg/dL (ref 210–475)

## 2022-02-11 LAB — LACTATE DEHYDROGENASE: LDH: 217 U/L — ABNORMAL HIGH (ref 98–192)

## 2022-02-11 LAB — LACTIC ACID, PLASMA: Lactic Acid, Venous: 0.8 mmol/L (ref 0.5–1.9)

## 2022-02-11 MED ORDER — FREE WATER
150.0000 mL | Status: DC
  Administered 2022-02-11 – 2022-02-12 (×6): 150 mL

## 2022-02-11 MED ORDER — PROSOURCE TF20 ENFIT COMPATIBL EN LIQD
60.0000 mL | Freq: Every day | ENTERAL | Status: DC
  Administered 2022-02-11 – 2022-02-14 (×14): 60 mL
  Filled 2022-02-11 (×14): qty 60

## 2022-02-11 MED ORDER — DEXAMETHASONE SODIUM PHOSPHATE 10 MG/ML IJ SOLN
20.0000 mg | Freq: Every day | INTRAMUSCULAR | Status: AC
  Administered 2022-02-12 – 2022-02-14 (×3): 20 mg via INTRAVENOUS
  Filled 2022-02-11 (×3): qty 2

## 2022-02-11 MED ORDER — CLONAZEPAM 1 MG PO TABS
1.0000 mg | ORAL_TABLET | Freq: Two times a day (BID) | ORAL | Status: DC
  Administered 2022-02-11 – 2022-02-12 (×4): 1 mg
  Filled 2022-02-11 (×5): qty 1

## 2022-02-11 MED ORDER — ENOXAPARIN SODIUM 40 MG/0.4ML IJ SOSY
40.0000 mg | PREFILLED_SYRINGE | Freq: Every day | INTRAMUSCULAR | Status: DC
  Administered 2022-02-12 – 2022-02-25 (×14): 40 mg via SUBCUTANEOUS
  Filled 2022-02-11 (×14): qty 0.4

## 2022-02-11 MED ORDER — NICARDIPINE HCL IN NACL 40-0.83 MG/200ML-% IV SOLN
3.0000 mg/h | INTRAVENOUS | Status: DC
  Administered 2022-02-11: 5 mg/h via INTRAVENOUS
  Filled 2022-02-11 (×2): qty 200

## 2022-02-11 MED ORDER — FUROSEMIDE 10 MG/ML IJ SOLN
40.0000 mg | Freq: Once | INTRAMUSCULAR | Status: AC
  Administered 2022-02-11: 40 mg via INTRAVENOUS
  Filled 2022-02-11: qty 4

## 2022-02-11 MED ORDER — DEXAMETHASONE SODIUM PHOSPHATE 10 MG/ML IJ SOLN
10.0000 mg | Freq: Every day | INTRAMUSCULAR | Status: DC
  Administered 2022-02-15 – 2022-02-17 (×3): 10 mg via INTRAVENOUS
  Filled 2022-02-11 (×3): qty 1

## 2022-02-11 MED ORDER — METOCLOPRAMIDE HCL 5 MG/ML IJ SOLN
10.0000 mg | Freq: Four times a day (QID) | INTRAMUSCULAR | Status: DC
  Administered 2022-02-11 – 2022-02-14 (×13): 10 mg via INTRAVENOUS
  Filled 2022-02-11 (×13): qty 2

## 2022-02-11 MED ORDER — FUROSEMIDE 10 MG/ML IJ SOLN
20.0000 mg | Freq: Once | INTRAMUSCULAR | Status: AC
  Administered 2022-02-11: 20 mg via INTRAVENOUS
  Filled 2022-02-11: qty 2

## 2022-02-11 MED ORDER — OXYCODONE HCL 5 MG PO TABS
10.0000 mg | ORAL_TABLET | Freq: Four times a day (QID) | ORAL | Status: DC
  Administered 2022-02-11 – 2022-02-14 (×13): 10 mg
  Filled 2022-02-11 (×13): qty 2

## 2022-02-11 MED ORDER — METHYLPREDNISOLONE SODIUM SUCC 125 MG IJ SOLR: 80.0000 mg | Freq: Every day | INTRAMUSCULAR | Status: DC

## 2022-02-11 MED ORDER — METHYLPREDNISOLONE SODIUM SUCC 40 MG IJ SOLR: 40.0000 mg | Freq: Every day | INTRAMUSCULAR | Status: DC

## 2022-02-11 NOTE — Progress Notes (Signed)
Nutrition Follow-up  DOCUMENTATION CODES:   Not applicable  INTERVENTION:   Tube Feeding via Cortrak (gastric):  Trickle TF of Vital 1.5 at 20 ml/hr resumed today Goal TF goal: Vital 1.5 at 55 ml/hr with Pro-source TF20 60 mL BID TF at goal rate provides 129 g of protein, 2140 kcals and 1003 mL of free water   Increased Pro-Source TF20 provision while on trickle TF, ordered 5 times daily. Each packet provides 80 kcals, 20 g of protein   Additional calorie provision via lipid in propofol infusion   Recommend addition of some free water given hypernatremia, minimal free water from TF at this time. Recommend starting 150 mL q 4 hours.   NUTRITION DIAGNOSIS:   Inadequate oral intake related to acute illness as evidenced by NPO status.  Continues but being addressed via TF trials  GOAL:   Patient will meet greater than or equal to 90% of their needs  Not Met  MONITOR:   Vent status, Labs, Weight trends  REASON FOR ASSESSMENT:   Consult, Ventilator Enteral/tube feeding initiation and management (ECMO)  ASSESSMENT:   27 yo female admitted with acute respiratory failure due to severe asthma exacerbation requiring intubation with deep sedation, paralyzed and cannulated for VV ECMO. PMH includes asthma, AR, eczema  2/05 Admitted, Intubated, deep sedation and paralyzed on nimbex gtt 2/06 VV ECMO cannulation at 12 am 2/07 Cortrak, Trickle TF, off nimbex 2/08 VV ECMO decannulation  Pt remains on vent support, fentanyl, propofol, ketamine gtt. No pressors. Noted CT head/chest this afternoon Propofol: 20.3 ml/hr  (535 kcals)  Noted plan to resume trickle TF today, Vital 1.5 at 20 ml/hr. Day 4 NPO/trickle TF. Pt receiving Pro-Source TF20 for additional protein, receiving additional kcals from propofol  Noted pt started on reglan, +stool today with rectal tube now in place.   No skin breakdown per RN skin assessment  Labs: sodium 149 (H), potassium wdl Meds: ss novolog,  lasix, reglan, miralax    Diet Order:   Diet Order             Diet NPO time specified Except for: Sips with Meds  Diet effective midnight                   EDUCATION NEEDS:   Not appropriate for education at this time  Skin:  Skin Assessment: Reviewed RN Assessment  Last BM:  2/9 rectal tube  Height:   Ht Readings from Last 1 Encounters:  02/07/22 5' (1.524 m)    Weight:   Wt Readings from Last 1 Encounters:  02/11/22 (S) 78.1 kg    Ideal Body Weight:     BMI:  Body mass index is 33.63 kg/m.  Estimated Nutritional Needs:   Kcal:  2000-2200 kcals  Protein:  115-135 g  Fluid:  >/= 2L    Kerman Passey MS, RDN, LDN, CNSC Registered Dietitian 3 Clinical Nutrition RD Pager and On-Call Pager Number Located in [place]

## 2022-02-11 NOTE — Progress Notes (Signed)
RT NOTE: RT transported patient on ventilator from room 2H23 to CT and back to room 123456 with no complications.

## 2022-02-11 NOTE — Progress Notes (Signed)
Bite block placed and secured to the ETT at this time.

## 2022-02-11 NOTE — Progress Notes (Signed)
Dr. Lynetta Mare made aware of whip in aline.  Instructed to use cuff pressures over arterial line pressures.  Keep aline for ABG/blood draws per Dr. Lynetta Mare.

## 2022-02-11 NOTE — Progress Notes (Signed)
Patient transported to CT with this RN and RT.  Returned by to [id number] without complications.

## 2022-02-11 NOTE — Progress Notes (Signed)
NAME:  Cheylynn [place], MRN:  [id number], DOB:  18-Oct-1995, LOS: 4 ADMISSION DATE:  02/07/2022, CONSULTATION DATE:  02/07/22 REFERRING MD:  Zenia Resides, CHIEF COMPLAINT:  dyspnea   History of Present Illness:  26yF with history of asthma, AR, eczema who presented today with acute dyspnea. Tried home nebulizers/rescue inhalers without relief. Has been around others with URIs. Has had fever, cough. Reportedly hospitalized in 12/2021 for asthma exacerbation.   In ED she was given continuous neb, steroids, IV magnesium and placed on BiPAP. She continued to have remarkably increased work of breathing, fatigue and decision made with Dr. Zenia Resides to intubate. Required a great deal of sedation and neuromuscular blockade to achieve adequate minute ventilation. After sedation and NMB on PRVC 400/14/5 peaks 40-55, plateau 20-25, tidal volumes ~400. Intrinsic peep of 13.  Pertinent  Medical History  Asthma  AR  Eczema  Significant Hospital Events: Including procedures, antibiotic start and stop dates in addition to other pertinent events   02/07/22 intubated, deep sedation, nimbex 02/08/22 Crescent 73F VV-ECMO 2/8 Decannulated   Interim History / Subjective:  Decannulated yesterday, required increase in respiratory rate. Tachycardic, worse on right side  Objective   Blood pressure (!) 140/69, pulse 82, temperature 98.8 F (37.1 C), temperature source Axillary, resp. rate (!) 25, height 5' (1.524 m), weight 78.8 kg, SpO2 96 %. CVP:  [6 mmHg-16 mmHg] 13 mmHg  Vent Mode: PRVC FiO2 (%):  [40 %-50 %] 50 % Set Rate:  [22 bmp-27 bmp] 25 bmp Vt Set:  [360 mL] 360 mL PEEP:  [10 cmH20] 10 cmH20 Plateau Pressure:  [22 cmH20-26 cmH20] 26 cmH20   Intake/Output Summary (Last 24 hours) at 02/11/2022 0749 Last data filed at 02/11/2022 0600 Gross per 24 hour  Intake 3579.26 ml  Output 2940 ml  Net 639.26 ml    Filed Weights   02/09/22 0356 02/10/22 0345 02/11/22 0500  Weight: 77.5 kg 76.6 kg 78.8 kg   I/O +3.3L  for admission  Examination: General: critically ill appearing woman lying in bed in NAD, average build [place]/AT: [place]/AT, eyes anicteric Neck: Cannulation site is intact Lungs: wheezing resolved, Pplat 24,no air trapping on flow curves.  CV: S1S2, RRR Abdomen: soft, NT Extremities: no significant edema, no cyanosis or clubbing Skin: warm, dry, no rashes Neuro: RASS -5, PERRL  POC - small layering right-sided effusion.  No sufficient pocket identified for drainage. Basilar atelectasis .     Ancillary tests personally reviewed  CXR - new right effusion opacification WBC 23 K  Assessment & Plan:   Acute hypoxic and hypercapnic respiratory failure due to severe acute asthma exacerbation trigger by rhinovirus infection Hypertension- likely due to med side effects Hypokalemia due to albuterol and bicarb- resolved Hyperglycemia due to steroids Acute blood loss anemia due to ECMO At risk for malnutrition; has had minimal enteral intake over this admission due to likely ileus  Plan:   -Asthma exacerbation appears to be resolving. Acceptable gas exchange and airway pressures on current strategy.  - Drop PEEP to 8.  - Continue current bronchodilator regimen.  - Start weaning systemic steroids. - Stop antibiotics as cultures negative.  - Diurese again today.  - Sedation weaning will be slow given high likelihood of withdrawal due to her age/rapid metabolism.  - Drop fentanyl and propofol by no more than 1/2 today.  - Start enteral oxycodone and clonazepam.  - Reglan for gastric dysmotility. Resume trickle feeds. Bowel function likely to remain impaired until on less sedation.   Best  Practice (right click and "Reselect all SmartList Selections" daily)   Diet/type: tubefeeds DVT prophylaxis: other- Lovenox  GI prophylaxis: H2B and PPI Lines: Central line, Arterial Line, and yes and it is still needed Foley:  Yes, and it is still needed Code Status:  full code Last date of  multidisciplinary goals of care discussion [ ]$    This patient is critically ill with multiple organ system failure which requires frequent high complexity decision making, assessment, support, evaluation, and titration of therapies. This was completed through the application of advanced monitoring technologies and extensive interpretation of multiple databases. During this encounter critical care time was devoted to patient care services described in this note for 35 minutes.  Kipp Brood, MD 02/11/22 7:49 AM [facility]  For contact information, see Amion. If no response to pager, please call PCCM consult pager. After hours, 7PM- 7AM, please call Elink.

## 2022-02-12 LAB — POCT I-STAT 7, (LYTES, BLD GAS, ICA,H+H)
Acid-Base Excess: 8 mmol/L — ABNORMAL HIGH (ref 0.0–2.0)
Acid-Base Excess: 10 mmol/L — ABNORMAL HIGH (ref 0.0–2.0)
Acid-Base Excess: 10 mmol/L — ABNORMAL HIGH (ref 0.0–2.0)
Bicarbonate: 34.3 mmol/L — ABNORMAL HIGH (ref 20.0–28.0)
Bicarbonate: 33.8 mmol/L — ABNORMAL HIGH (ref 20.0–28.0)
Bicarbonate: 34.8 mmol/L — ABNORMAL HIGH (ref 20.0–28.0)
Calcium, Ion: 1.24 mmol/L (ref 1.15–1.40)
Calcium, Ion: 1.23 mmol/L (ref 1.15–1.40)
Calcium, Ion: 1.18 mmol/L (ref 1.15–1.40)
HCT: 26 % — ABNORMAL LOW (ref 36.0–46.0)
HCT: 26 % — ABNORMAL LOW (ref 36.0–46.0)
HCT: 29 % — ABNORMAL LOW (ref 36.0–46.0)
Hemoglobin: 8.8 g/dL — ABNORMAL LOW (ref 12.0–15.0)
Hemoglobin: 8.8 g/dL — ABNORMAL LOW (ref 12.0–15.0)
Hemoglobin: 9.9 g/dL — ABNORMAL LOW (ref 12.0–15.0)
O2 Saturation: 99 %
O2 Saturation: 93 %
O2 Saturation: 97 %
Patient temperature: 98.7
Patient temperature: 98.8
Patient temperature: 99.4
Potassium: 3.3 mmol/L — ABNORMAL LOW (ref 3.5–5.1)
Potassium: 4.4 mmol/L (ref 3.5–5.1)
Potassium: 3.7 mmol/L (ref 3.5–5.1)
Sodium: 151 mmol/L — ABNORMAL HIGH (ref 135–145)
Sodium: 150 mmol/L — ABNORMAL HIGH (ref 135–145)
Sodium: 152 mmol/L — ABNORMAL HIGH (ref 135–145)
TCO2: 36 mmol/L — ABNORMAL HIGH (ref 22–32)
TCO2: 35 mmol/L — ABNORMAL HIGH (ref 22–32)
TCO2: 36 mmol/L — ABNORMAL HIGH (ref 22–32)
pCO2 arterial: 55.9 mmHg — ABNORMAL HIGH (ref 32–48)
pCO2 arterial: 43.8 mmHg (ref 32–48)
pCO2 arterial: 46.3 mmHg (ref 32–48)
pH, Arterial: 7.397 (ref 7.35–7.45)
pH, Arterial: 7.495 — ABNORMAL HIGH (ref 7.35–7.45)
pH, Arterial: 7.485 — ABNORMAL HIGH (ref 7.35–7.45)
pO2, Arterial: 170 mmHg — ABNORMAL HIGH (ref 83–108)
pO2, Arterial: 63 mmHg — ABNORMAL LOW (ref 83–108)
pO2, Arterial: 86 mmHg (ref 83–108)

## 2022-02-12 LAB — BASIC METABOLIC PANEL
Anion gap: 7 (ref 5–15)
Anion gap: 8 (ref 5–15)
BUN: 37 mg/dL — ABNORMAL HIGH (ref 6–20)
BUN: 39 mg/dL — ABNORMAL HIGH (ref 6–20)
CO2: 32 mmol/L (ref 22–32)
CO2: 32 mmol/L (ref 22–32)
Calcium: 8.3 mg/dL — ABNORMAL LOW (ref 8.9–10.3)
Calcium: 8.5 mg/dL — ABNORMAL LOW (ref 8.9–10.3)
Chloride: 108 mmol/L (ref 98–111)
Chloride: 109 mmol/L (ref 98–111)
Creatinine, Ser: 0.72 mg/dL (ref 0.44–1.00)
Creatinine, Ser: 0.71 mg/dL (ref 0.44–1.00)
GFR, Estimated: >60 mL/min (ref >60)
GFR, Estimated: >60 mL/min (ref >60)
Glucose, Bld: 125 mg/dL — ABNORMAL HIGH (ref 70–99)
Glucose, Bld: 153 mg/dL — ABNORMAL HIGH (ref 70–99)
Potassium: 3.2 mmol/L — ABNORMAL LOW (ref 3.5–5.1)
Potassium: 3.7 mmol/L (ref 3.5–5.1)
Sodium: 147 mmol/L — ABNORMAL HIGH (ref 135–145)
Sodium: 149 mmol/L — ABNORMAL HIGH (ref 135–145)

## 2022-02-12 LAB — GLUCOSE, CAPILLARY
Glucose-Capillary: 113 mg/dL — ABNORMAL HIGH (ref 70–99)
Glucose-Capillary: 89 mg/dL (ref 70–99)
Glucose-Capillary: 107 mg/dL — ABNORMAL HIGH (ref 70–99)
Glucose-Capillary: 146 mg/dL — ABNORMAL HIGH (ref 70–99)

## 2022-02-12 LAB — CBC
HCT: 28.2 % — ABNORMAL LOW (ref 36.0–46.0)
Hemoglobin: 8.7 g/dL — ABNORMAL LOW (ref 12.0–15.0)
MCH: 27.1 pg (ref 26.0–34.0)
MCHC: 30.9 g/dL (ref 30.0–36.0)
MCV: 87.9 fL (ref 80.0–100.0)
Platelets: 151 10*3/uL (ref 150–400)
RBC: 3.21 MIL/uL — ABNORMAL LOW (ref 3.87–5.11)
RDW: 16.7 % — ABNORMAL HIGH (ref 11.5–15.5)
WBC: 18.9 10*3/uL — ABNORMAL HIGH (ref 4.0–10.5)
nRBC: 0.2 % (ref 0.0–0.2)

## 2022-02-12 LAB — CULTURE, BLOOD (ROUTINE X 2)
Culture: NO GROWTH
Culture: NO GROWTH
Special Requests: ADEQUATE
Special Requests: ADEQUATE

## 2022-02-12 LAB — TRIGLYCERIDES: Triglycerides: 226 mg/dL — ABNORMAL HIGH (ref <150)

## 2022-02-12 MED ORDER — VITAL 1.5 CAL PO LIQD
1000.0000 mL | ORAL | Status: DC
  Administered 2022-02-12 – 2022-02-20 (×8): 1000 mL
  Filled 2022-02-12 (×4): qty 1000

## 2022-02-12 MED ORDER — POTASSIUM CHLORIDE 10 MEQ/50ML IV SOLN
10.0000 meq | INTRAVENOUS | Status: AC
  Administered 2022-02-12 (×4): 10 meq via INTRAVENOUS
  Filled 2022-02-12 (×3): qty 50

## 2022-02-12 MED ORDER — DILTIAZEM HCL-DEXTROSE 125-5 MG/125ML-% IV SOLN (PREMIX)
5.0000 mg/h | INTRAVENOUS | Status: DC
  Administered 2022-02-12: 10 mg/h via INTRAVENOUS
  Administered 2022-02-13 (×3): 15 mg/h via INTRAVENOUS
  Administered 2022-02-14: 20 mg/h via INTRAVENOUS
  Administered 2022-02-14 – 2022-02-17 (×8): 15 mg/h via INTRAVENOUS
  Filled 2022-02-12 (×13): qty 125

## 2022-02-12 MED ORDER — FREE WATER
200.0000 mL | Status: DC
  Administered 2022-02-12 – 2022-02-15 (×19): 200 mL

## 2022-02-12 MED ORDER — DILTIAZEM HCL 25 MG/5ML IV SOLN
10.0000 mg | Freq: Once | INTRAVENOUS | Status: AC
  Administered 2022-02-12: 10 mg via INTRAVENOUS
  Filled 2022-02-12: qty 5

## 2022-02-12 MED ORDER — POTASSIUM CHLORIDE 20 MEQ PO PACK
20.0000 meq | PACK | ORAL | Status: AC
  Administered 2022-02-12 (×2): 20 meq
  Filled 2022-02-12 (×2): qty 1

## 2022-02-12 MED ORDER — FUROSEMIDE 10 MG/ML IJ SOLN
40.0000 mg | Freq: Once | INTRAMUSCULAR | Status: AC
  Administered 2022-02-12: 40 mg via INTRAVENOUS
  Filled 2022-02-12: qty 4

## 2022-02-12 MED ORDER — DILTIAZEM HCL 30 MG PO TABS
30.0000 mg | ORAL_TABLET | Freq: Four times a day (QID) | ORAL | Status: DC
  Administered 2022-02-12 – 2022-02-13 (×3): 30 mg
  Filled 2022-02-12 (×3): qty 1

## 2022-02-12 MED ORDER — VITAL 1.5 CAL PO LIQD: 1000.0000 mL | ORAL | Status: DC

## 2022-02-12 NOTE — Plan of Care (Signed)
  Problem: Activity: Goal: Ability to tolerate increased activity will improve Outcome: Progressing   Problem: Respiratory: Goal: Ability to maintain a clear airway and adequate ventilation will improve Outcome: Progressing   Problem: Role Relationship: Goal: Method of communication will improve Outcome: Progressing   Problem: Education: Goal: Knowledge of General Education information will improve Description: Including pain rating scale, medication(s)/side effects and non-pharmacologic comfort measures Outcome: Progressing   Problem: Health Behavior/Discharge Planning: Goal: Ability to manage health-related needs will improve Outcome: Progressing   Problem: Clinical Measurements: Goal: Ability to maintain clinical measurements within normal limits will improve Outcome: Progressing Goal: Will remain free from infection Outcome: Progressing Goal: Diagnostic test results will improve Outcome: Progressing Goal: Respiratory complications will improve Outcome: Progressing Goal: Cardiovascular complication will be avoided Outcome: Progressing   Problem: Activity: Goal: Risk for activity intolerance will decrease Outcome: Progressing   Problem: Nutrition: Goal: Adequate nutrition will be maintained Outcome: Progressing   Problem: Coping: Goal: Level of anxiety will decrease Outcome: Progressing   Problem: Elimination: Goal: Will not experience complications related to bowel motility Outcome: Progressing Goal: Will not experience complications related to urinary retention Outcome: Progressing   Problem: Pain Managment: Goal: General experience of comfort will improve Outcome: Progressing   Problem: Safety: Goal: Ability to remain free from injury will improve Outcome: Progressing   Problem: Skin Integrity: Goal: Risk for impaired skin integrity will decrease Outcome: Progressing   Problem: Education: Goal: Ability to describe self-care measures that may  prevent or decrease complications (Diabetes Survival Skills Education) will improve Outcome: Progressing Goal: Individualized Educational Video(s) Outcome: Progressing   Problem: Coping: Goal: Ability to adjust to condition or change in health will improve Outcome: Progressing   Problem: Fluid Volume: Goal: Ability to maintain a balanced intake and output will improve Outcome: Progressing   Problem: Health Behavior/Discharge Planning: Goal: Ability to identify and utilize available resources and services will improve Outcome: Progressing Goal: Ability to manage health-related needs will improve Outcome: Progressing   Problem: Metabolic: Goal: Ability to maintain appropriate glucose levels will improve Outcome: Progressing   Problem: Nutritional: Goal: Maintenance of adequate nutrition will improve Outcome: Progressing Goal: Progress toward achieving an optimal weight will improve Outcome: Progressing   Problem: Skin Integrity: Goal: Risk for impaired skin integrity will decrease Outcome: Progressing   Problem: Tissue Perfusion: Goal: Adequacy of tissue perfusion will improve Outcome: Progressing   

## 2022-02-12 NOTE — Progress Notes (Signed)
Held CPT due to increased HR and Blood pressure

## 2022-02-12 NOTE — Progress Notes (Signed)
CPT stopped by RN due to tachycardia and spiked increase in BP. MD aware.

## 2022-02-12 NOTE — Progress Notes (Signed)
[facility] ADULT ICU REPLACEMENT PROTOCOL   The patient does apply for the [facility] Adult ICU Electrolyte Replacment Protocol based on the criteria listed below:   1.Exclusion criteria: TCTS, ECMO, Dialysis, and Myasthenia Gravis patients 2. Is GFR >/= 30 ml/min? Yes.    Patient's GFR today is > 60 3. Is SCr </= 2? Yes.   Patient's SCr is 0.72 mg/dL 4. Did SCr increase >/= 0.5 in 24 hours? No. 5.Pt's weight >40kg  Yes.   6. Abnormal electrolyte(s): K+ 3.2  7. Electrolytes replaced per protocol 8.  Call MD STAT for K+ </= 2.5, Phos </= 1, or Mag </= 1 Physician:  Dr Salome Holmes, Elfredia Nevins 02/12/2022 5:22 AM

## 2022-02-12 NOTE — Progress Notes (Signed)
NAME:  Kaitlyn Mcneil, MRN:  [id number], DOB:  10/10/1995, LOS: 5 ADMISSION DATE:  02/07/2022, CONSULTATION DATE:  02/07/22 REFERRING MD:  Zenia Resides, CHIEF COMPLAINT:  dyspnea   History of Present Illness:  26yF with history of asthma, AR, eczema who presented today with acute dyspnea. Tried home nebulizers/rescue inhalers without relief. Has been around others with URIs. Has had fever, cough. Reportedly hospitalized in 12/2021 for asthma exacerbation.   In ED she was given continuous neb, steroids, IV magnesium and placed on BiPAP. She continued to have remarkably increased work of breathing, fatigue and decision made with Dr. Zenia Resides to intubate. Required a great deal of sedation and neuromuscular blockade to achieve adequate minute ventilation. After sedation and NMB on PRVC 400/14/5 peaks 40-55, plateau 20-25, tidal volumes ~400. Intrinsic peep of 13.  Pertinent  Medical History  Asthma  AR  Eczema  Significant Hospital Events: Including procedures, antibiotic start and stop dates in addition to other pertinent events   02/07/22 intubated, deep sedation, nimbex 02/08/22 [facility] 20F VV-ECMO 2/8 Decannulated   Interim History / Subjective:  Hypertensive on cleviprex. Occasional blepharospasm.   Objective   Blood pressure (!) 148/76, pulse (!) 119, temperature 98.7 F (37.1 C), temperature source Axillary, resp. rate (!) 29, height 5' (1.524 m), weight 76.5 kg, SpO2 94 %. CVP:  [8 mmHg-15 mmHg] 8 mmHg  Vent Mode: PRVC FiO2 (%):  [50 %] 50 % Set Rate:  [25 bmp] 25 bmp Vt Set:  [360 mL] 360 mL PEEP:  [8 cmH20] 8 cmH20 Plateau Pressure:  [22 cmH20-24 cmH20] 24 cmH20   Intake/Output Summary (Last 24 hours) at 02/12/2022 1223 Last data filed at 02/12/2022 1200 Gross per 24 hour  Intake 2563.94 ml  Output 3800 ml  Net -1236.06 ml    Filed Weights   02/11/22 0500 02/11/22 1515 02/12/22 0400  Weight: 78.8 kg (S) 78.1 kg 76.5 kg   I/O +3.3L for admission  Examination: General:  critically ill appearing woman lying in bed in NAD, average build [place]/AT: [place]/AT, eyes anicteric Neck: Cannulation site is intact Lungs: wheezing resolved, Pplat 24,no air trapping on flow curves. PEEPtot 9 CV: S1S2, RRR Abdomen: soft, NT Extremities: no significant edema, no cyanosis or clubbing Skin: warm, dry, no rashes Neuro: RASS -4, PERRL  POC - small layering right-sided effusion.  No sufficient pocket identified for drainage. Basilar atelectasis .     Ancillary tests personally reviewed  CT head unremarkable, CT chest small amount of LLL consolidation.  WBC 18 K  Na 151 Mild increase CO2 to 55.9 Assessment & Plan:   Acute hypoxic and hypercapnic respiratory failure due to severe acute asthma exacerbation trigger by rhinovirus infection Hypertension- likely due to med side effects Hypernatremia. Hyperglycemia due to steroids Acute blood loss anemia due to ECMO At risk for malnutrition; has had minimal enteral intake over this admission due to likely ileus  Plan:   -Asthma exacerbation appears to be resolving. Acceptable gas exchange and airway pressures on current strategy.  - Keep PEEP at 8.   - Continue current bronchodilator regimen.  - Continue to taper systemic steroids. - Stopped antibiotics as cultures negative.  - Diurese again today.  - Sedation weaning will be slow given high likelihood of withdrawal due to her age/rapid metabolism.  - Stopped ketamine today.  - Drop fentanyl and propofol by no more than 1/2 today.  - Continue enteral oxycodone and clonazepam.  - Reglan for gastric dysmotility. Increase feeds to goal. - Follow ABG, increased  CO2 production may be reason for increased hypercarbia.  - increase free water  - Discussed progress with mother. Goal is to have her in a position for SBT by Mon/Tue  Best Practice (right click and "Reselect all SmartList Selections" daily)   Diet/type: tubefeeds DVT prophylaxis: other- Lovenox  GI prophylaxis: H2B  and PPI Lines: Central line, Arterial Line, and yes and it is still needed Foley:  Yes, and it is still needed Code Status:  full code Last date of multidisciplinary goals of care discussion [ mom updated 2/10]   This patient is critically ill with multiple organ system failure which requires frequent high complexity decision making, assessment, support, evaluation, and titration of therapies. This was completed through the application of advanced monitoring technologies and extensive interpretation of multiple databases. During this encounter critical care time was devoted to patient care services described in this note for 35 minutes.  Kipp Brood, MD 02/12/22 12:23 PM [facility]  For contact information, see Amion. If no response to pager, please call PCCM consult pager. After hours, 7PM- 7AM, please call Elink.

## 2022-02-13 LAB — POCT I-STAT 7, (LYTES, BLD GAS, ICA,H+H)
Acid-Base Excess: 12 mmol/L — ABNORMAL HIGH (ref 0.0–2.0)
Bicarbonate: 36.3 mmol/L — ABNORMAL HIGH (ref 20.0–28.0)
Calcium, Ion: 1.22 mmol/L (ref 1.15–1.40)
HCT: 29 % — ABNORMAL LOW (ref 36.0–46.0)
Hemoglobin: 9.9 g/dL — ABNORMAL LOW (ref 12.0–15.0)
O2 Saturation: 96 %
Patient temperature: 98.8
Potassium: 3.9 mmol/L (ref 3.5–5.1)
Sodium: 150 mmol/L — ABNORMAL HIGH (ref 135–145)
TCO2: 38 mmol/L — ABNORMAL HIGH (ref 22–32)
pCO2 arterial: 48.1 mmHg — ABNORMAL HIGH (ref 32–48)
pH, Arterial: 7.486 — ABNORMAL HIGH (ref 7.35–7.45)
pO2, Arterial: 77 mmHg — ABNORMAL LOW (ref 83–108)

## 2022-02-13 LAB — BASIC METABOLIC PANEL
Anion gap: 8 (ref 5–15)
BUN: 34 mg/dL — ABNORMAL HIGH (ref 6–20)
CO2: 32 mmol/L (ref 22–32)
Calcium: 8.4 mg/dL — ABNORMAL LOW (ref 8.9–10.3)
Chloride: 110 mmol/L (ref 98–111)
Creatinine, Ser: 0.63 mg/dL (ref 0.44–1.00)
GFR, Estimated: >60 mL/min (ref >60)
Glucose, Bld: 157 mg/dL — ABNORMAL HIGH (ref 70–99)
Potassium: 3.4 mmol/L — ABNORMAL LOW (ref 3.5–5.1)
Sodium: 150 mmol/L — ABNORMAL HIGH (ref 135–145)

## 2022-02-13 LAB — CBC
HCT: 26.8 % — ABNORMAL LOW (ref 36.0–46.0)
Hemoglobin: 8.8 g/dL — ABNORMAL LOW (ref 12.0–15.0)
MCH: 28 pg (ref 26.0–34.0)
MCHC: 32.8 g/dL (ref 30.0–36.0)
MCV: 85.4 fL (ref 80.0–100.0)
Platelets: 146 10*3/uL — ABNORMAL LOW (ref 150–400)
RBC: 3.14 MIL/uL — ABNORMAL LOW (ref 3.87–5.11)
RDW: 16.7 % — ABNORMAL HIGH (ref 11.5–15.5)
WBC: 18 10*3/uL — ABNORMAL HIGH (ref 4.0–10.5)
nRBC: 0.3 % — ABNORMAL HIGH (ref 0.0–0.2)

## 2022-02-13 LAB — CULTURE, RESPIRATORY W GRAM STAIN
Culture: NO GROWTH
Gram Stain: NONE SEEN

## 2022-02-13 LAB — GLUCOSE, CAPILLARY
Glucose-Capillary: 114 mg/dL — ABNORMAL HIGH (ref 70–99)
Glucose-Capillary: 134 mg/dL — ABNORMAL HIGH (ref 70–99)
Glucose-Capillary: 153 mg/dL — ABNORMAL HIGH (ref 70–99)
Glucose-Capillary: 168 mg/dL — ABNORMAL HIGH (ref 70–99)
Glucose-Capillary: 175 mg/dL — ABNORMAL HIGH (ref 70–99)

## 2022-02-13 LAB — TRIGLYCERIDES: Triglycerides: 320 mg/dL — ABNORMAL HIGH (ref <150)

## 2022-02-13 MED ORDER — POTASSIUM CHLORIDE 20 MEQ PO PACK
40.0000 meq | PACK | Freq: Once | ORAL | Status: AC
  Administered 2022-02-13: 40 meq
  Filled 2022-02-13: qty 2

## 2022-02-13 MED ORDER — HYDRALAZINE HCL 25 MG PO TABS
25.0000 mg | ORAL_TABLET | Freq: Three times a day (TID) | ORAL | Status: DC
  Administered 2022-02-13 – 2022-02-15 (×7): 25 mg
  Filled 2022-02-13 (×7): qty 1

## 2022-02-13 MED ORDER — CLONAZEPAM 1 MG PO TABS
2.0000 mg | ORAL_TABLET | Freq: Three times a day (TID) | ORAL | Status: DC
  Administered 2022-02-13 – 2022-02-15 (×9): 2 mg
  Filled 2022-02-13 (×9): qty 2

## 2022-02-13 MED ORDER — DILTIAZEM HCL 60 MG PO TABS
90.0000 mg | ORAL_TABLET | Freq: Four times a day (QID) | ORAL | Status: DC
  Administered 2022-02-13 – 2022-02-20 (×25): 90 mg
  Filled 2022-02-13 (×24): qty 1

## 2022-02-13 MED ORDER — FUROSEMIDE 10 MG/ML IJ SOLN
40.0000 mg | Freq: Two times a day (BID) | INTRAMUSCULAR | Status: AC
  Administered 2022-02-13 – 2022-02-14 (×2): 40 mg via INTRAVENOUS
  Filled 2022-02-13 (×2): qty 4

## 2022-02-13 NOTE — Progress Notes (Signed)
[facility] ADULT ICU REPLACEMENT PROTOCOL   The patient does apply for the [facility] Adult ICU Electrolyte Replacment Protocol based on the criteria listed below:   1.Exclusion criteria: TCTS, ECMO, Dialysis, and Myasthenia Gravis patients 2. Is GFR >/= 30 ml/min? Yes.    Patient's GFR today is > 60 3. Is SCr </= 2? Yes.   Patient's SCr is 0.63 mg/dL 4. Did SCr increase >/= 0.5 in 24 hours? No. 5.Pt's weight >40kg  Yes.   6. Abnormal electrolyte(s): K+ 3.4  7. Electrolytes replaced per protocol 8.  Call MD STAT for K+ </= 2.5, Phos </= 1, or Mag </= 1 Physician:  Dr Salome Holmes, Elfredia Nevins 02/13/2022 4:55 AM

## 2022-02-13 NOTE — Plan of Care (Signed)
  Problem: Activity: Goal: Ability to tolerate increased activity will improve Outcome: Progressing   Problem: Respiratory: Goal: Ability to maintain a clear airway and adequate ventilation will improve Outcome: Progressing   Problem: Role Relationship: Goal: Method of communication will improve Outcome: Progressing   Problem: Education: Goal: Knowledge of General Education information will improve Description: Including pain rating scale, medication(s)/side effects and non-pharmacologic comfort measures Outcome: Progressing   Problem: Health Behavior/Discharge Planning: Goal: Ability to manage health-related needs will improve Outcome: Progressing   Problem: Clinical Measurements: Goal: Ability to maintain clinical measurements within normal limits will improve Outcome: Progressing Goal: Will remain free from infection Outcome: Progressing Goal: Diagnostic test results will improve Outcome: Progressing Goal: Respiratory complications will improve Outcome: Progressing Goal: Cardiovascular complication will be avoided Outcome: Progressing   Problem: Activity: Goal: Risk for activity intolerance will decrease Outcome: Progressing   Problem: Nutrition: Goal: Adequate nutrition will be maintained Outcome: Progressing   Problem: Coping: Goal: Level of anxiety will decrease Outcome: Progressing   Problem: Elimination: Goal: Will not experience complications related to bowel motility Outcome: Progressing Goal: Will not experience complications related to urinary retention Outcome: Progressing   Problem: Pain Managment: Goal: General experience of comfort will improve Outcome: Progressing   Problem: Safety: Goal: Ability to remain free from injury will improve Outcome: Progressing   Problem: Skin Integrity: Goal: Risk for impaired skin integrity will decrease Outcome: Progressing   Problem: Education: Goal: Ability to describe self-care measures that may  prevent or decrease complications (Diabetes Survival Skills Education) will improve Outcome: Progressing Goal: Individualized Educational Video(s) Outcome: Progressing   Problem: Coping: Goal: Ability to adjust to condition or change in health will improve Outcome: Progressing   Problem: Fluid Volume: Goal: Ability to maintain a balanced intake and output will improve Outcome: Progressing   Problem: Health Behavior/Discharge Planning: Goal: Ability to identify and utilize available resources and services will improve Outcome: Progressing Goal: Ability to manage health-related needs will improve Outcome: Progressing   Problem: Metabolic: Goal: Ability to maintain appropriate glucose levels will improve Outcome: Progressing   Problem: Nutritional: Goal: Maintenance of adequate nutrition will improve Outcome: Progressing Goal: Progress toward achieving an optimal weight will improve Outcome: Progressing   Problem: Skin Integrity: Goal: Risk for impaired skin integrity will decrease Outcome: Progressing   Problem: Tissue Perfusion: Goal: Adequacy of tissue perfusion will improve Outcome: Progressing   

## 2022-02-13 NOTE — Progress Notes (Signed)
NAME:  Kaitlyn Mcneil, MRN:  [id number], DOB:  25-Oct-1995, LOS: 6 ADMISSION DATE:  02/07/2022, CONSULTATION DATE:  02/07/22 REFERRING MD:  Kaitlyn Mcneil, CHIEF COMPLAINT:  dyspnea   History of Present Illness:  26yF with history of asthma, AR, eczema who presented today with acute dyspnea. Tried home nebulizers/rescue inhalers without relief. Has been around others with URIs. Has had fever, cough. Reportedly hospitalized in 12/2021 for asthma exacerbation.   In ED she was given continuous neb, steroids, IV magnesium and placed on BiPAP. She continued to have remarkably increased work of breathing, fatigue and decision made with Dr. Zenia Mcneil to intubate. Required a great deal of sedation and neuromuscular blockade to achieve adequate minute ventilation. After sedation and NMB on PRVC 400/14/5 peaks 40-55, plateau 20-25, tidal volumes ~400. Intrinsic peep of 13.  Pertinent  Medical History  Asthma  AR  Eczema  Significant Hospital Events: Including procedures, antibiotic start and stop dates in addition to other pertinent events   02/07/22 intubated, deep sedation, nimbex 02/08/22 [name] VV-ECMO 2/8 Decannulated   Interim History / Subjective:  Remains hypertensive as sedation weaned.  Objective   Blood pressure (!) 144/79, pulse 82, temperature 99.1 F (37.3 C), resp. rate (!) 27, height 5' (1.524 m), weight 74.6 kg, SpO2 97 %. CVP:  [3 mmHg-22 mmHg] 12 mmHg  Vent Mode: PRVC FiO2 (%):  [50 %] 50 % Set Rate:  [27 bmp] 27 bmp Vt Set:  [360 mL] 360 mL PEEP:  [8 cmH20] 8 cmH20 Plateau Pressure:  [20 cmH20-25 cmH20] 20 cmH20   Intake/Output Summary (Last 24 hours) at 02/13/2022 1204 Last data filed at 02/13/2022 1000 Gross per 24 hour  Intake 2869.33 ml  Output 3670 ml  Net -800.67 ml    Filed Weights   02/11/22 1515 02/12/22 0400 02/13/22 0500  Weight: (S) 78.1 kg 76.5 kg 74.6 kg   Examination: General: critically ill appearing woman lying in bed in NAD, average build [place]/AT: [name],  eyes anicteric Neck: Cannulation site is intact Lungs: wheezing resolved, Pplat 24,no air trapping on flow curves. PEEPtot 9 CV: S1S2, RRR Abdomen: soft, NT Extremities: no significant edema, no cyanosis or clubbing Skin: warm, dry, no rashes Neuro: RASS -4, PERRL  Ancillary tests personally reviewed  pH 7.4 8/46/86 Sodium 150, potassium 3.4 WBC 18.0 Assessment & Plan:   Acute hypoxic and hypercapnic respiratory failure due to severe acute asthma exacerbation trigger by rhinovirus infection Hypertension- likely due to med side effects Hypernatremia. Hyperglycemia due to steroids Acute blood loss anemia due to ECMO At risk for malnutrition; has had minimal enteral intake over this admission due to likely ileus  Plan:   -Asthma exacerbation appears to be resolving. Acceptable gas exchange and airway pressures on current strategy.  -Wean PEEP to 8 - Continue current bronchodilator regimen.  - Continue to taper systemic steroids. - Stopped antibiotics as cultures negative.  - Diurese again today.  - Sedation weaning will be slow given high likelihood of withdrawal due to her age/rapid metabolism.   -Attempt to wean propofol off today and continue to decrease fentanyl. - Continue enteral oxycodone and [place] enteral clonazepam.  - Reglan for gastric dysmotility. Increase feeds to goal. -Continue water. -Started on hydralazine and enteral diltiazem for hypertension and tachycardia. - Discussed progress with mother. Goal is to have her in a position for SBT by Mon/Tue  Best Practice (right click and "Reselect all SmartList Selections" daily)   Diet/type: tubefeeds DVT prophylaxis: other- Lovenox  GI prophylaxis: H2B and  PPI Lines: Central line, Arterial Line, and yes and it is still needed Foley:  Yes, and it is still needed Code Status:  full code Last date of multidisciplinary goals of care discussion [ mom updated 2/10]   This patient is critically ill with multiple organ  system failure which requires frequent high complexity decision making, assessment, support, evaluation, and titration of therapies. This was completed through the application of advanced monitoring technologies and extensive interpretation of multiple databases. During this encounter critical care time was devoted to patient care services described in this note for 35 minutes.  Kaitlyn Brood, MD 02/13/22 12:04 PM [facility]  For contact information, see Amion. If no response to pager, please call PCCM consult pager. After hours, 7PM- 7AM, please call Elink.

## 2022-02-14 LAB — CBC
HCT: 27 % — ABNORMAL LOW (ref 36.0–46.0)
Hemoglobin: 8.8 g/dL — ABNORMAL LOW (ref 12.0–15.0)
MCH: 27.8 pg (ref 26.0–34.0)
MCHC: 32.6 g/dL (ref 30.0–36.0)
MCV: 85.4 fL (ref 80.0–100.0)
Platelets: 155 10*3/uL (ref 150–400)
RBC: 3.16 MIL/uL — ABNORMAL LOW (ref 3.87–5.11)
RDW: 16.6 % — ABNORMAL HIGH (ref 11.5–15.5)
WBC: 19.4 10*3/uL — ABNORMAL HIGH (ref 4.0–10.5)
nRBC: 0.2 % (ref 0.0–0.2)

## 2022-02-14 LAB — GLUCOSE, CAPILLARY
Glucose-Capillary: 139 mg/dL — ABNORMAL HIGH (ref 70–99)
Glucose-Capillary: 143 mg/dL — ABNORMAL HIGH (ref 70–99)
Glucose-Capillary: 149 mg/dL — ABNORMAL HIGH (ref 70–99)
Glucose-Capillary: 179 mg/dL — ABNORMAL HIGH (ref 70–99)
Glucose-Capillary: 182 mg/dL — ABNORMAL HIGH (ref 70–99)
Glucose-Capillary: 183 mg/dL — ABNORMAL HIGH (ref 70–99)
Glucose-Capillary: 191 mg/dL — ABNORMAL HIGH (ref 70–99)

## 2022-02-14 LAB — BASIC METABOLIC PANEL
Anion gap: 9 (ref 5–15)
BUN: 30 mg/dL — ABNORMAL HIGH (ref 6–20)
CO2: 32 mmol/L (ref 22–32)
Calcium: 8.5 mg/dL — ABNORMAL LOW (ref 8.9–10.3)
Chloride: 106 mmol/L (ref 98–111)
Creatinine, Ser: 0.64 mg/dL (ref 0.44–1.00)
GFR, Estimated: >60 mL/min (ref >60)
Glucose, Bld: 183 mg/dL — ABNORMAL HIGH (ref 70–99)
Potassium: 3.9 mmol/L (ref 3.5–5.1)
Sodium: 147 mmol/L — ABNORMAL HIGH (ref 135–145)

## 2022-02-14 LAB — TRIGLYCERIDES: Triglycerides: 101 mg/dL (ref <150)

## 2022-02-14 MED ORDER — BANATROL TF EN LIQD
60.0000 mL | Freq: Two times a day (BID) | ENTERAL | Status: DC
  Administered 2022-02-14 – 2022-02-19 (×7): 60 mL
  Filled 2022-02-14 (×7): qty 60

## 2022-02-14 MED ORDER — PROSOURCE TF20 ENFIT COMPATIBL EN LIQD
60.0000 mL | Freq: Two times a day (BID) | ENTERAL | Status: DC
  Administered 2022-02-14 – 2022-02-22 (×14): 60 mL
  Filled 2022-02-14 (×15): qty 60

## 2022-02-14 MED ORDER — OXYCODONE HCL 5 MG PO TABS
15.0000 mg | ORAL_TABLET | Freq: Four times a day (QID) | ORAL | Status: DC
  Administered 2022-02-14 – 2022-02-16 (×7): 15 mg
  Filled 2022-02-14 (×7): qty 3

## 2022-02-14 MED ORDER — QUETIAPINE FUMARATE 50 MG PO TABS
50.0000 mg | ORAL_TABLET | Freq: Two times a day (BID) | ORAL | Status: DC
  Administered 2022-02-14 – 2022-02-17 (×4): 50 mg via ORAL
  Filled 2022-02-14 (×4): qty 1

## 2022-02-14 NOTE — Progress Notes (Signed)
NAME:  Kaitlyn Mcneil, MRN:  [id number], DOB:  11/21/1995, LOS: 7 ADMISSION DATE:  02/07/2022, CONSULTATION DATE:  02/07/22 REFERRING MD:  Zenia Resides, CHIEF COMPLAINT:  dyspnea   History of Present Illness:  26yF with history of asthma, AR, eczema who presented today with acute dyspnea. Tried home nebulizers/rescue inhalers without relief. Has been around others with URIs. Has had fever, cough. Reportedly hospitalized in 12/2021 for asthma exacerbation.   In ED she was given continuous neb, steroids, IV magnesium and placed on BiPAP. She continued to have remarkably increased work of breathing, fatigue and decision made with Dr. Zenia Resides to intubate. Required a great deal of sedation and neuromuscular blockade to achieve adequate minute ventilation. After sedation and NMB on PRVC 400/14/5 peaks 40-55, plateau 20-25, tidal volumes ~400. Intrinsic peep of 13.  Pertinent  Medical History  Asthma  AR  Eczema  Significant Hospital Events: Including procedures, antibiotic start and stop dates in addition to other pertinent events   02/07/22 intubated, deep sedation, nimbex 02/08/22 [facility] 11F VV-ECMO 2/8 Decannulated  2/11 weaning off sedation but having episodes of hypertension and tachycardia.  Interim History / Subjective:  Continues to have episodes of hypertension and tachycardia with sedation weaning.  Objective   Blood pressure 131/77, pulse (!) 101, temperature 99.5 F (37.5 C), temperature source Oral, resp. rate (!) 28, height 5' (1.524 m), weight 75 kg, SpO2 95 %. CVP:  [4 mmHg-21 mmHg] 14 mmHg  Vent Mode: PRVC FiO2 (%):  [40 %-50 %] 40 % Set Rate:  [27 bmp] 27 bmp Vt Set:  [360 mL] 360 mL PEEP:  [5 cmH20-8 cmH20] 5 cmH20 Plateau Pressure:  [16 cmH20-26 cmH20] 19 cmH20   Intake/Output Summary (Last 24 hours) at 02/14/2022 1526 Last data filed at 02/14/2022 1000 Gross per 24 hour  Intake 2088.39 ml  Output 5545 ml  Net -3456.61 ml    Filed Weights   02/12/22 0400 02/13/22 0500  02/14/22 0500  Weight: 76.5 kg 74.6 kg 75 kg   Examination: General: critically ill appearing woman lying in bed in NAD, average build [place]/AT: [place]/AT, eyes anicteric Neck: Cannulation site is intact Lungs: wheezing resolved,  CV: S1S2, RRR Abdomen: soft, NT Extremities: no significant edema, no cyanosis or clubbing Skin: warm, dry, no rashes Neuro: RASS -3 patient will open eyes to voice but not track or follow commands.  Ancillary tests personally reviewed  pH 7.48/48/77 Sodium 147, potassium 3.9 WBC 19.4  Assessment & Plan:   Acute hypoxic and hypercapnic respiratory failure due to severe acute asthma exacerbation trigger by rhinovirus infection Hypertension- likely due to med side effects Hypernatremia-improving with free water. Hyperglycemia due to steroids Anemia of acute illness. At risk for malnutrition moderating tube feeds.  Plan:   -Asthma exacerbation appears to have resolved.. Acceptable gas exchange and airway pressures on current strategy.  -Wean PEEP to 5 - Continue current bronchodilator regimen.  - Continue to taper systemic steroids. - Sedation weaning will be slow given high likelihood of withdrawal due to her age/rapid metabolism.   -Attempt to wean propofol off today and continue to decrease fentanyl. -Increase enteral oxycodone and continue enteral clonazepam.  -Stop Reglan today as now having diarrhea. -Continue free water. -Started on hydralazine and enteral diltiazem for hypertension and tachycardia.  Titrate IV diltiazem for blood pressure and heart rate as needed while sedation is being weaned. - Discussed progress with mother. Goal is to have her in a position for SBT by Mon/Tue  Best Practice (right click and "  Reselect all SmartList Selections" daily)   Diet/type: tubefeeds DVT prophylaxis: other- Lovenox  GI prophylaxis: H2B and PPI Lines: Central line, Arterial Line, and yes and it is still needed Foley:  Yes, and it is still needed Code  Status:  full code Last date of multidisciplinary goals of care discussion [ mom updated 2/10]   This patient is critically ill with multiple organ system failure which requires frequent high complexity decision making, assessment, support, evaluation, and titration of therapies. This was completed through the application of advanced monitoring technologies and extensive interpretation of multiple databases. During this encounter critical care time was devoted to patient care services described in this note for 35 minutes.  Kipp Brood, MD 02/14/22 3:26 PM [facility]  For contact information, see Amion. If no response to pager, please call PCCM consult pager. After hours, 7PM- 7AM, please call Elink.

## 2022-02-14 NOTE — Progress Notes (Addendum)
Nutrition Follow-up  DOCUMENTATION CODES:   Not applicable  INTERVENTION:   Tube Feeding via Cortrak: Vital 1.5 at 55 ml/hr Decrease Pro-Source TF20 to 60 mL BID TF at goal rate provides 129 g of protein, 2140 kcals and 1003 mL of free water   TF at current goal rate with free water flush of 200 mL q 4 hours provides 2.2 L of free water  Additional calories from propofol  Add Banatrol TF BID, each packet contains 5 g of soluble fiber  NUTRITION DIAGNOSIS:   Inadequate oral intake related to acute illness as evidenced by NPO status.  Being addressed via TF   GOAL:   Patient will meet greater than or equal to 90% of their needs  Progressing  MONITOR:   Vent status, Labs, Weight trends  REASON FOR ASSESSMENT:   Consult, Ventilator Enteral/tube feeding initiation and management (ECMO)  ASSESSMENT:   27 yo female admitted with acute respiratory failure due to severe asthma exacerbation requiring intubation with deep sedation, paralyzed and cannulated for VV ECMO. PMH includes asthma, AR, eczema  2/05 Admitted, Intubated, deep sedation and paralyzed on nimbex gtt 2/06 VV ECMO cannulation at 12 am 2/07 Cortrak (gastric), Trickle TF, off nimbex 2/08 VV ECMO decannulation    Pt remains on vent support Propofol: 11.4 (300 kcals)  Vital 1.5 at 55 ml/hr, increased past trickles to goal rate over the weekend, Tolerating per RN via Cortrak Noted free water flush of 200 mL q 4 hours  No skin breakdown per RN skin assessment  +stool via rectal tube, 379 mL in 24 hours  UOP 2.6 L in 24 hours, 1.7 L thus far today  Current wt 75 kg  Labs: sodium 147 (H), Creatinine wdl Meds: decadron, colace, miralax, reglan   Diet Order:   Diet Order             Diet NPO time specified Except for: Sips with Meds  Diet effective midnight                   EDUCATION NEEDS:   Not appropriate for education at this time  Skin:  Skin Assessment: Reviewed RN  Assessment  Last BM:  2/12  Height:   Ht Readings from Last 1 Encounters:  02/07/22 5' (1.524 m)    Weight:   Wt Readings from Last 1 Encounters:  02/14/22 75 kg     BMI:  Body mass index is 32.29 kg/m.  Estimated Nutritional Needs:   Kcal:  2000-2200 kcals  Protein:  115-135 g  Fluid:  >/= 2L    Kerman Passey MS, RDN, LDN, CNSC Registered Dietitian 3 Clinical Nutrition RD Pager and On-Call Pager Number Located in [place]

## 2022-02-15 LAB — POCT I-STAT 7, (LYTES, BLD GAS, ICA,H+H)
Acid-Base Excess: 9 mmol/L — ABNORMAL HIGH (ref 0.0–2.0)
Bicarbonate: 33.5 mmol/L — ABNORMAL HIGH (ref 20.0–28.0)
Calcium, Ion: 1.23 mmol/L (ref 1.15–1.40)
HCT: 28 % — ABNORMAL LOW (ref 36.0–46.0)
Hemoglobin: 9.5 g/dL — ABNORMAL LOW (ref 12.0–15.0)
O2 Saturation: 95 %
Patient temperature: 99
Potassium: 3.8 mmol/L (ref 3.5–5.1)
Sodium: 142 mmol/L (ref 135–145)
TCO2: 35 mmol/L — ABNORMAL HIGH (ref 22–32)
pCO2 arterial: 43.7 mmHg (ref 32–48)
pH, Arterial: 7.493 — ABNORMAL HIGH (ref 7.35–7.45)
pO2, Arterial: 73 mmHg — ABNORMAL LOW (ref 83–108)

## 2022-02-15 LAB — CBC
HCT: 27.3 % — ABNORMAL LOW (ref 36.0–46.0)
Hemoglobin: 8.8 g/dL — ABNORMAL LOW (ref 12.0–15.0)
MCH: 27.2 pg (ref 26.0–34.0)
MCHC: 32.2 g/dL (ref 30.0–36.0)
MCV: 84.5 fL (ref 80.0–100.0)
Platelets: 183 10*3/uL (ref 150–400)
RBC: 3.23 MIL/uL — ABNORMAL LOW (ref 3.87–5.11)
RDW: 16.3 % — ABNORMAL HIGH (ref 11.5–15.5)
WBC: 20.7 10*3/uL — ABNORMAL HIGH (ref 4.0–10.5)
nRBC: 0.2 % (ref 0.0–0.2)

## 2022-02-15 LAB — BASIC METABOLIC PANEL
Anion gap: 8 (ref 5–15)
BUN: 22 mg/dL — ABNORMAL HIGH (ref 6–20)
CO2: 31 mmol/L (ref 22–32)
Calcium: 8.7 mg/dL — ABNORMAL LOW (ref 8.9–10.3)
Chloride: 103 mmol/L (ref 98–111)
Creatinine, Ser: 0.57 mg/dL (ref 0.44–1.00)
GFR, Estimated: >60 mL/min (ref >60)
Glucose, Bld: 170 mg/dL — ABNORMAL HIGH (ref 70–99)
Potassium: 3.7 mmol/L (ref 3.5–5.1)
Sodium: 142 mmol/L (ref 135–145)

## 2022-02-15 LAB — GLUCOSE, CAPILLARY
Glucose-Capillary: 157 mg/dL — ABNORMAL HIGH (ref 70–99)
Glucose-Capillary: 185 mg/dL — ABNORMAL HIGH (ref 70–99)
Glucose-Capillary: 151 mg/dL — ABNORMAL HIGH (ref 70–99)
Glucose-Capillary: 220 mg/dL — ABNORMAL HIGH (ref 70–99)
Glucose-Capillary: 183 mg/dL — ABNORMAL HIGH (ref 70–99)
Glucose-Capillary: 164 mg/dL — ABNORMAL HIGH (ref 70–99)

## 2022-02-15 LAB — TRIGLYCERIDES: Triglycerides: 130 mg/dL (ref <150)

## 2022-02-15 MED ORDER — CLONIDINE HCL 0.1 MG PO TABS
0.1000 mg | ORAL_TABLET | Freq: Two times a day (BID) | ORAL | Status: DC
  Administered 2022-02-15 – 2022-02-21 (×10): 0.1 mg via ORAL
  Filled 2022-02-15 (×10): qty 1

## 2022-02-15 MED ORDER — DEXMEDETOMIDINE HCL IN NACL 400 MCG/100ML IV SOLN
0.0000 ug/kg/h | INTRAVENOUS | Status: DC
  Administered 2022-02-15: 0.7 ug/kg/h via INTRAVENOUS
  Administered 2022-02-15: 0.4 ug/kg/h via INTRAVENOUS
  Administered 2022-02-16: 0.8 ug/kg/h via INTRAVENOUS
  Administered 2022-02-16 – 2022-02-17 (×4): 1.2 ug/kg/h via INTRAVENOUS
  Administered 2022-02-17: 0.5 ug/kg/h via INTRAVENOUS
  Administered 2022-02-17: 1.2 ug/kg/h via INTRAVENOUS
  Administered 2022-02-18 (×2): 0.5 ug/kg/h via INTRAVENOUS
  Administered 2022-02-19: 0.8 ug/kg/h via INTRAVENOUS
  Filled 2022-02-15 (×13): qty 100

## 2022-02-15 MED ORDER — FREE WATER
200.0000 mL | Freq: Four times a day (QID) | Status: AC
  Administered 2022-02-15 (×2): 200 mL

## 2022-02-15 NOTE — Progress Notes (Signed)
NAME:  Kaitlyn Mcneil, MRN:  [id number], DOB:  [birth year], LOS: 8 ADMISSION DATE:  02/07/2022, CONSULTATION DATE:  02/07/2022 REFERRING MD:  Zenia Resides, CHIEF COMPLAINT:  Dyspnea  History of Present Illness:  27 yo F with pertinent PMH of asthma, AR, and eczema who presented 2/5 with acute dyspnea. Tried home nebulizers/rescue inhalers without relief.  Had been exposed to others with URIs and has had fever, cough. Reportedly hospitalized in 12/2021 for asthma exacerbation.   In the ED, she was given continuous nebulizer, steroids, IV magnesium and placed on BiPAP.  She continued to have remarkably increased work of breathing, fatigue and decision was made with Dr. Zenia Resides to intubate.  Required a great deal of sedation and neuromuscular blockade to achieve adequate minute ventilation.  Was placed on ECMO for 48 hours on 2/6 and decannulated 2/8.  Continued an asthma treatment course on ventilator and began weaning off sedation 2/11, however experienced repeat episodes of tachycardia and hypertension when weaning and required addition of Precedex and clonidine.  Pertinent  Medical History  - Asthma - AR - Eczema  Significant Hospital Events: Including procedures, antibiotic start and stop dates in addition to other pertinent events   2/5 - Intubated with NMB, deep sedation, nimbex 2/6 - [facility] 50F VV-ECMO   2/8 - Decannulated  2/11 - Weaning off sedation but having episodes of hypertension and tachycardia 2/13 - Continuing to wean off sedation with hypertension, starting clonidine and Precedex  Interim History / Subjective:  Still hypertensive when weaned off sedation this morning.  Objective   Blood pressure (!) 151/80, pulse (!) 120, temperature 99.1 F (37.3 C), temperature source Oral, resp. rate 13, height 5' (1.524 m), weight 76 kg, SpO2 94 %. CVP:  [4 mmHg-24 mmHg] 14 mmHg  Vent Mode: PRVC FiO2 (%):  [40 %] 40 % Set Rate:  [27 bmp] 27 bmp Vt Set:  [360 mL] 360 mL PEEP:  [5  cmH20] 5 cmH20 Plateau Pressure:  [19 cmH20-21 cmH20] 21 cmH20   Intake/Output Summary (Last 24 hours) at 02/15/2022 0814 Last data filed at 02/15/2022 0700 Gross per 24 hour  Intake 3082.58 ml  Output 4365 ml  Net -1282.42 ml   Filed Weights   02/13/22 0500 02/14/22 0500 02/15/22 0500  Weight: 74.6 kg 75 kg 76 kg   Examination: General: Young female, sedated and intubated. HEENT: Pupils constricted, not responsive Lungs: Mechanical breath sounds, no wheezing. Cardiovascular: Tachycardic, regular rhythm. 3/6 holosystolic murmur heard best at LUSB. 3+ pulses and well perfused extremities bilaterally. Abdomen: Soft, nondistended. GU: Foley in place. Extremities: No peripheral edema or cyanosis. Skin: No rashes grossly. Neuro: Sedated, pupils constricted. Does not follow instructions at RASS -2.   Assessment & Plan:  Acute hypoxic and hypercapnic respiratory failure in setting of acute asthma exacerbation 2/2 rhinovirus Continues to become hypertensive when weaned off sedation.  Will focus on weaning Fentanyl and leave propofol on for now as well as add clonidine and Precedex to see if that helps patient stay normotensive during wean. - On PRVC, gradually weaning sedation - Continue Pulmicort/Brovana nebulizers BID, Atrovent Q4h, Yuperli daily - Singulair 10 mg daily - Dexamethasone taper, 10 mg now  Hypertension, tachycardia - Continue diltiazem 90 mg Q6h and hydralazine 25 mg Q8h - Added clonidine and Precedex for sedation wean  Hypernatremia, improved - Decrease frequency of free water or discontinue altogether  Hyperglycemia 2/2 steroid use - SSI - Tapering dexamethasone, 10 mg now  Anemia of chronic illness Malnutrition risk on tube  feeds CTM anemia. - Tube feeds per RD  Best Practice (right click and "Reselect all SmartList Selections" daily)   Diet/type: tubefeeds DVT prophylaxis: other; Lovenox GI prophylaxis: PPI Lines: Central line, Arterial Line, and yes  and it is still needed Foley:  Yes, and it is still needed Code Status:  full code Last date of multidisciplinary goals of care discussion: Mother updated 2/12   Jaquavious Mercer [place], MS4 02/15/2022, 8:14 AM Johnell Comings, PCCM

## 2022-02-15 NOTE — Progress Notes (Signed)
[facility] Progress Note Patient Name: Kaitlyn Mcneil DOB: 07-15-1995 MRN: [id number]   Date of Service  02/15/2022  HPI/Events of Note  Patient with acute hypoxic hypercapnic respiratory failure currently ventilated likely in setting of acute asthma exacerbation  AM ABG:40%/ 5 PEEP/ 27 RR/ 360 PRVC   eICU Interventions  No change in plan is indicated at this time.  Continue current care     Intervention Category Major Interventions: Hypercarbia - evaluation and management  Medardo Hassing 02/15/2022, 4:51 AM

## 2022-02-16 LAB — CBC
HCT: 29.1 % — ABNORMAL LOW (ref 36.0–46.0)
Hemoglobin: 9.5 g/dL — ABNORMAL LOW (ref 12.0–15.0)
MCH: 27.4 pg (ref 26.0–34.0)
MCHC: 32.6 g/dL (ref 30.0–36.0)
MCV: 83.9 fL (ref 80.0–100.0)
Platelets: 206 10*3/uL (ref 150–400)
RBC: 3.47 MIL/uL — ABNORMAL LOW (ref 3.87–5.11)
RDW: 16 % — ABNORMAL HIGH (ref 11.5–15.5)
WBC: 25.1 10*3/uL — ABNORMAL HIGH (ref 4.0–10.5)
nRBC: 0.1 % (ref 0.0–0.2)

## 2022-02-16 LAB — BASIC METABOLIC PANEL
Anion gap: 7 (ref 5–15)
BUN: 16 mg/dL (ref 6–20)
CO2: 29 mmol/L (ref 22–32)
Calcium: 8.5 mg/dL — ABNORMAL LOW (ref 8.9–10.3)
Chloride: 101 mmol/L (ref 98–111)
Creatinine, Ser: 0.63 mg/dL (ref 0.44–1.00)
GFR, Estimated: >60 mL/min (ref >60)
Glucose, Bld: 149 mg/dL — ABNORMAL HIGH (ref 70–99)
Potassium: 3.8 mmol/L (ref 3.5–5.1)
Sodium: 137 mmol/L (ref 135–145)

## 2022-02-16 LAB — GLUCOSE, CAPILLARY
Glucose-Capillary: 136 mg/dL — ABNORMAL HIGH (ref 70–99)
Glucose-Capillary: 137 mg/dL — ABNORMAL HIGH (ref 70–99)
Glucose-Capillary: 145 mg/dL — ABNORMAL HIGH (ref 70–99)
Glucose-Capillary: 147 mg/dL — ABNORMAL HIGH (ref 70–99)
Glucose-Capillary: 156 mg/dL — ABNORMAL HIGH (ref 70–99)
Glucose-Capillary: 171 mg/dL — ABNORMAL HIGH (ref 70–99)

## 2022-02-16 LAB — POCT I-STAT 7, (LYTES, BLD GAS, ICA,H+H)
Acid-Base Excess: 7 mmol/L — ABNORMAL HIGH (ref 0.0–2.0)
Acid-Base Excess: 7 mmol/L — ABNORMAL HIGH (ref 0.0–2.0)
Bicarbonate: 31 mmol/L — ABNORMAL HIGH (ref 20.0–28.0)
Bicarbonate: 31.1 mmol/L — ABNORMAL HIGH (ref 20.0–28.0)
Calcium, Ion: 1.24 mmol/L (ref 1.15–1.40)
Calcium, Ion: 1.24 mmol/L (ref 1.15–1.40)
HCT: 30 % — ABNORMAL LOW (ref 36.0–46.0)
HCT: 31 % — ABNORMAL LOW (ref 36.0–46.0)
Hemoglobin: 10.2 g/dL — ABNORMAL LOW (ref 12.0–15.0)
Hemoglobin: 10.5 g/dL — ABNORMAL LOW (ref 12.0–15.0)
O2 Saturation: 96 %
O2 Saturation: 97 %
Patient temperature: 98.3
Patient temperature: 98.8
Potassium: 3.9 mmol/L (ref 3.5–5.1)
Potassium: 4 mmol/L (ref 3.5–5.1)
Sodium: 140 mmol/L (ref 135–145)
Sodium: 140 mmol/L (ref 135–145)
TCO2: 32 mmol/L (ref 22–32)
TCO2: 32 mmol/L (ref 22–32)
pCO2 arterial: 41.3 mmHg (ref 32–48)
pCO2 arterial: 40.7 mmHg (ref 32–48)
pH, Arterial: 7.483 — ABNORMAL HIGH (ref 7.35–7.45)
pH, Arterial: 7.493 — ABNORMAL HIGH (ref 7.35–7.45)
pO2, Arterial: 79 mmHg — ABNORMAL LOW (ref 83–108)
pO2, Arterial: 80 mmHg — ABNORMAL LOW (ref 83–108)

## 2022-02-16 LAB — ECHO TEE

## 2022-02-16 LAB — TRIGLYCERIDES: Triglycerides: 81 mg/dL (ref <150)

## 2022-02-16 MED ORDER — FUROSEMIDE 10 MG/ML IJ SOLN: 40.0000 mg | Freq: Once | INTRAMUSCULAR | Status: AC

## 2022-02-16 MED ORDER — FUROSEMIDE 10 MG/ML IJ SOLN
INTRAMUSCULAR | Status: AC
  Administered 2022-02-16: 40 mg via INTRAVENOUS
  Filled 2022-02-16: qty 4

## 2022-02-16 MED ORDER — METOPROLOL TARTRATE 5 MG/5ML IV SOLN: 2.5000 mg | Freq: Once | INTRAVENOUS | Status: DC

## 2022-02-16 NOTE — Progress Notes (Signed)
   02/16/22 1515  Spiritual Encounters  Type of Visit Attempt (pt unavailable)  Care provided to: Pt not available  Reason for visit Routine spiritual support  OnCall Visit No   Visited patient sleeping, family not present.  Will follow-up

## 2022-02-16 NOTE — Progress Notes (Signed)
MD contacted to assess patient at 10:41 due to becoming increasingly agitated having unstable vital signs ( HR: 149, BP: 173/113 and O2:88%). Patient also coughing up frothy clear secretions from mouth. Copious amounts of oral secretions suctioned out along with a moderate amount of thick white endotracheal secretions. In line suction catheter was not able to be advanced as far as it was able to be prior. RT called as well and at the bedside suctioning patient and placing them back on full support. See MAR: hydralazine given, fentanyl bolus given, versed bolus given, and Precedex restarted and titrated up without any changes to patient. Reached out to provider again and asked to come assess patient at the bedside. Order for one time dose of Lasix given. MD at bedside at 11:01. Blood pressure 217/175. HR 121. Oxygen 84%. Provider gave patient bolus of propofol and fentanyl. RN then restarted propofol infusion and Fentanyl infusion.

## 2022-02-16 NOTE — Progress Notes (Addendum)
NAME:  Felicia Newcomb, MRN:  [id number], DOB:  [birth year], LOS: 9 ADMISSION DATE:  02/07/2022, CONSULTATION DATE:  02/07/2022 REFERRING MD:  Zenia Resides, CHIEF COMPLAINT:  Dyspnea  History of Present Illness:  27 yo F with pertinent PMH of asthma, AR, and eczema who presented 2/5 with acute dyspnea. Tried home nebulizers/rescue inhalers without relief.  Had been exposed to others with URIs and has had fever, cough. Reportedly hospitalized in 12/2021 for asthma exacerbation.   In the ED, she was given continuous nebulizer, steroids, IV magnesium and placed on BiPAP.  She continued to have remarkably increased work of breathing, fatigue and decision was made with Dr. Zenia Resides to intubate.  Required a great deal of sedation and neuromuscular blockade to achieve adequate minute ventilation.   Was placed on ECMO for 48 hours on 2/6 and decannulated 2/8.  Continued an asthma treatment course on ventilator and began weaning off sedation 2/11, however experienced repeat episodes of tachycardia and hypertension when weaning and required addition of Precedex and clonidine.  Slowly progressing through sedation wean with goal of extubation.  Pertinent  Medical History  - Asthma - AR - Eczema  Significant Hospital Events: Including procedures, antibiotic start and stop dates in addition to other pertinent events   2/5 - Intubated with NMB, deep sedation, nimbex 2/6 - [facility] 63F VV-ECMO   2/8 - Decannulated  2/11 - Weaning off sedation but having episodes of hypertension and tachycardia 2/13 - Continuing to wean off sedation with hypertension, starting clonidine and Precedex 2/14 - Finishing sedation wean and planning to extubate when following instructions.  Interim History / Subjective:  Remains intubated and on 5 propofol, 1.2 Precedex, and 25 Fentanyl.  Weaning in progress.  Some tachycardia and HTN as sedation decreased this morning.  Able to nod and occasionally wiggle toes per instructions with 5-10  second processing delay.  Objective   Blood pressure 124/69, pulse 78, temperature 98.3 F (36.8 C), temperature source Oral, resp. rate 18, height 5' (1.524 m), weight 78.1 kg, SpO2 94 %. CVP:  [5 mmHg-17 mmHg] 11 mmHg  Vent Mode: PRVC FiO2 (%):  [40 %] 40 % Set Rate:  [27 bmp] 27 bmp Vt Set:  [360 mL] 360 mL PEEP:  [5 cmH20] 5 cmH20 Plateau Pressure:  [20 cmH20-25 cmH20] 20 cmH20   Intake/Output Summary (Last 24 hours) at 02/16/2022 0731 Last data filed at 02/16/2022 0600 Gross per 24 hour  Intake 2443.6 ml  Output 2970 ml  Net -526.4 ml   Filed Weights   02/14/22 0500 02/15/22 0500 02/16/22 0437  Weight: 75 kg 76 kg 78.1 kg   Examination: General: Sedated and intubated female, appears stated age. HEENT: MMM, NCAT, PERRLA, though constricted. Slowly tracking movements, eyes open. Lungs: On ventilator. Mechanical breath sounds. No wheezes or rhonchi. Cardiovascular: Tachycardic, regular rhythm. 2/6 RUSB systolic murmur. Abdomen: Nondistended, soft. Normoactive bowel sounds. GU: Foley in place, draining. Extremities: Well perfused, no cyanosis. 2+ pulses bilaterally. Trace pedal edema. Skin: No rashes grossly. Neuro: RASS -1. Follows instructions with 5-10 second delay. Opens eyes to stimulation.  [facility] Labs [facility] Diagnostics  Arterial pH 7.483 Bicarb trending down, 33.5 > 31.0 Na dropping, 142 > 140 > 137 K steady, 3.8 > 3.8 WBC trending up, 19.4 > 20.7 > 25.1 Hgb steady, 9.5  Assessment & Plan:  Acute hypoxic and hypercapnic respiratory failure in setting of acute asthma exacerbation 2/2 rhinovirus Continues to wean, but still hypertensive and tachycardic, likely due to young age and prolonged  sedation.  However, slowly progressing and will be ready to extubate when more alert. - On PRVC, weaning sedation as tolerated - Continue nebulizer regimen - Singulair 10 mg daily - Dexamethasone taper  Hypertension, tachycardia Permissive HTN, goal SBP <160 to facilitate  sedation wean. - Diltiazem titrated to help wean - Precedex for wean as well, discontinued clonidine and clonazepam  Leukocytosis Suspect WBC trending up is sign of possible infection.  Possible sources include arterial and central lines as well as Foley or ventilator.  Lines most likely given duration of use.  Will remove arterial line and CTM. - Daily CBC  Hyperglycemia 2/2 steroid use CBGs stable - SSI - Dexamethasone taper, almost finished   Anemia of chronic illness Malnutrition risk on tube feeds CTM, Hgb stable. - Tube feeds per RD - Daily CBC  Hypernatremia, resolved Free water now stopped  Best Practice (right click and "Reselect all SmartList Selections" daily)   Diet/type: tubefeeds and NPO w/ meds via tube DVT prophylaxis: LMWH, SCDs GI prophylaxis: PPI Lines: Central line, Arterial Line, and No longer needed.  Order written to d/c  Foley:  Yes, and it is still needed Code Status:  full code Last date of multidisciplinary goals of care discussion: 2/13 with Pt's mother   Atlee Kluth Katy Fitch, MS4 02/16/2022, 7:31 AM Johnell Comings, PCCM

## 2022-02-17 DIAGNOSIS — A4189 Other specified sepsis: Secondary | ICD-10-CM | POA: Diagnosis not present

## 2022-02-17 DIAGNOSIS — Z1152 Encounter for screening for COVID-19: Secondary | ICD-10-CM | POA: Diagnosis not present

## 2022-02-17 DIAGNOSIS — G9341 Metabolic encephalopathy: Secondary | ICD-10-CM | POA: Diagnosis not present

## 2022-02-17 DIAGNOSIS — J9601 Acute respiratory failure with hypoxia: Secondary | ICD-10-CM | POA: Diagnosis not present

## 2022-02-17 LAB — BASIC METABOLIC PANEL
Anion gap: 9 (ref 5–15)
BUN: 19 mg/dL (ref 6–20)
CO2: 27 mmol/L (ref 22–32)
Calcium: 8.7 mg/dL — ABNORMAL LOW (ref 8.9–10.3)
Chloride: 104 mmol/L (ref 98–111)
Creatinine, Ser: 0.62 mg/dL (ref 0.44–1.00)
GFR, Estimated: >60 mL/min (ref >60)
Glucose, Bld: 130 mg/dL — ABNORMAL HIGH (ref 70–99)
Potassium: 3.7 mmol/L (ref 3.5–5.1)
Sodium: 140 mmol/L (ref 135–145)

## 2022-02-17 LAB — GLUCOSE, CAPILLARY
Glucose-Capillary: 128 mg/dL — ABNORMAL HIGH (ref 70–99)
Glucose-Capillary: 129 mg/dL — ABNORMAL HIGH (ref 70–99)
Glucose-Capillary: 129 mg/dL — ABNORMAL HIGH (ref 70–99)
Glucose-Capillary: 132 mg/dL — ABNORMAL HIGH (ref 70–99)
Glucose-Capillary: 160 mg/dL — ABNORMAL HIGH (ref 70–99)
Glucose-Capillary: 163 mg/dL — ABNORMAL HIGH (ref 70–99)

## 2022-02-17 LAB — URINALYSIS, ROUTINE W REFLEX MICROSCOPIC
Bilirubin Urine: NEGATIVE
Glucose, UA: NEGATIVE mg/dL
Ketones, ur: NEGATIVE mg/dL
Leukocytes,Ua: NEGATIVE
Nitrite: NEGATIVE
Protein, ur: NEGATIVE mg/dL
Specific Gravity, Urine: 1.015 (ref 1.005–1.030)
pH: 7 (ref 5.0–8.0)

## 2022-02-17 LAB — POCT I-STAT 7, (LYTES, BLD GAS, ICA,H+H)
Acid-Base Excess: 5 mmol/L — ABNORMAL HIGH (ref 0.0–2.0)
Bicarbonate: 28.2 mmol/L — ABNORMAL HIGH (ref 20.0–28.0)
Calcium, Ion: 1.24 mmol/L (ref 1.15–1.40)
HCT: 29 % — ABNORMAL LOW (ref 36.0–46.0)
Hemoglobin: 9.9 g/dL — ABNORMAL LOW (ref 12.0–15.0)
O2 Saturation: 98 %
Patient temperature: 98.7
Potassium: 3.9 mmol/L (ref 3.5–5.1)
Sodium: 139 mmol/L (ref 135–145)
TCO2: 29 mmol/L (ref 22–32)
pCO2 arterial: 36.7 mmHg (ref 32–48)
pH, Arterial: 7.494 — ABNORMAL HIGH (ref 7.35–7.45)
pO2, Arterial: 99 mmHg (ref 83–108)

## 2022-02-17 LAB — CBC
HCT: 28.8 % — ABNORMAL LOW (ref 36.0–46.0)
Hemoglobin: 9.2 g/dL — ABNORMAL LOW (ref 12.0–15.0)
MCH: 27 pg (ref 26.0–34.0)
MCHC: 31.9 g/dL (ref 30.0–36.0)
MCV: 84.5 fL (ref 80.0–100.0)
Platelets: 225 10*3/uL (ref 150–400)
RBC: 3.41 MIL/uL — ABNORMAL LOW (ref 3.87–5.11)
RDW: 16 % — ABNORMAL HIGH (ref 11.5–15.5)
WBC: 25 10*3/uL — ABNORMAL HIGH (ref 4.0–10.5)
nRBC: 0.1 % (ref 0.0–0.2)

## 2022-02-17 LAB — URINALYSIS, MICROSCOPIC (REFLEX): RBC / HPF: >50 RBC/hpf (ref 0–5)

## 2022-02-17 LAB — MAGNESIUM: Magnesium: 2.2 mg/dL (ref 1.7–2.4)

## 2022-02-17 LAB — TRIGLYCERIDES: Triglycerides: 186 mg/dL — ABNORMAL HIGH (ref <150)

## 2022-02-17 MED ORDER — CLONAZEPAM 0.5 MG PO TABS: 0.5000 mg | ORAL_TABLET | Freq: Two times a day (BID) | ORAL | Status: DC

## 2022-02-17 MED ORDER — CLONAZEPAM 0.5 MG PO TABS
0.5000 mg | ORAL_TABLET | Freq: Two times a day (BID) | ORAL | Status: AC
  Administered 2022-02-19 – 2022-02-20 (×4): 0.5 mg
  Filled 2022-02-17 (×4): qty 1

## 2022-02-17 MED ORDER — CLONAZEPAM 1 MG PO TABS
1.0000 mg | ORAL_TABLET | Freq: Two times a day (BID) | ORAL | Status: DC
  Administered 2022-02-17 (×2): 1 mg via ORAL
  Filled 2022-02-17 (×2): qty 1

## 2022-02-17 MED ORDER — OXYCODONE HCL 5 MG PO TABS: 5.0000 mg | ORAL_TABLET | Freq: Four times a day (QID) | ORAL | Status: DC | PRN

## 2022-02-17 MED ORDER — QUETIAPINE FUMARATE 50 MG PO TABS
50.0000 mg | ORAL_TABLET | Freq: Two times a day (BID) | ORAL | Status: DC
  Administered 2022-02-18 – 2022-02-19 (×3): 50 mg
  Filled 2022-02-17 (×3): qty 1

## 2022-02-17 MED ORDER — HYDRALAZINE HCL 20 MG/ML IJ SOLN
10.0000 mg | INTRAMUSCULAR | Status: DC | PRN
  Administered 2022-02-17 – 2022-02-18 (×6): 20 mg via INTRAVENOUS
  Filled 2022-02-17 (×6): qty 1

## 2022-02-17 MED ORDER — OXYCODONE HCL 5 MG PO TABS
7.5000 mg | ORAL_TABLET | Freq: Four times a day (QID) | ORAL | Status: AC
  Administered 2022-02-18 – 2022-02-19 (×6): 7.5 mg
  Filled 2022-02-17 (×6): qty 2

## 2022-02-17 MED ORDER — HALOPERIDOL LACTATE 5 MG/ML IJ SOLN
5.0000 mg | Freq: Four times a day (QID) | INTRAMUSCULAR | Status: DC | PRN
  Administered 2022-02-17: 5 mg via INTRAVENOUS
  Filled 2022-02-17: qty 1

## 2022-02-17 MED ORDER — OXYCODONE HCL 5 MG PO TABS
2.5000 mg | ORAL_TABLET | Freq: Four times a day (QID) | ORAL | Status: DC
  Administered 2022-02-19 – 2022-02-21 (×6): 2.5 mg
  Filled 2022-02-17 (×6): qty 1

## 2022-02-17 MED ORDER — CLONAZEPAM 1 MG PO TABS
1.0000 mg | ORAL_TABLET | Freq: Two times a day (BID) | ORAL | Status: AC
  Administered 2022-02-18 (×2): 1 mg
  Filled 2022-02-17 (×2): qty 1

## 2022-02-17 MED ORDER — ORAL CARE MOUTH RINSE
15.0000 mL | OROMUCOSAL | Status: DC
  Administered 2022-02-17 – 2022-02-25 (×23): 15 mL via OROMUCOSAL

## 2022-02-17 MED ORDER — DILTIAZEM HCL 25 MG/5ML IV SOLN
10.0000 mg | Freq: Once | INTRAVENOUS | Status: AC
  Administered 2022-02-17: 10 mg via INTRAVENOUS
  Filled 2022-02-17: qty 5

## 2022-02-17 MED ORDER — OXYCODONE HCL 5 MG PO TABS: 2.5000 mg | ORAL_TABLET | Freq: Four times a day (QID) | ORAL | Status: DC

## 2022-02-17 MED ORDER — ORAL CARE MOUTH RINSE: 15.0000 mL | OROMUCOSAL | Status: DC | PRN

## 2022-02-17 MED ORDER — OXYCODONE HCL 5 MG PO TABS
7.5000 mg | ORAL_TABLET | Freq: Four times a day (QID) | ORAL | Status: DC
  Administered 2022-02-17 (×2): 7.5 mg via ORAL
  Filled 2022-02-17 (×2): qty 2

## 2022-02-17 NOTE — Procedures (Signed)
Extubation Procedure Note  Patient Details:   Name: Kaitlyn Mcneil DOB: November 26, 1995 MRN: [id number]   Airway Documentation:    Vent end date: 02/17/22 Vent end time: 1146   Evaluation  O2 sats: stable throughout Complications: No apparent complications Patient did tolerate procedure well. Bilateral Breath Sounds: Diminished   Yes  Alvera Singh 02/17/2022, 11:59 AM

## 2022-02-17 NOTE — Progress Notes (Incomplete)
Nutrition Follow-up  DOCUMENTATION CODES:   Not applicable  INTERVENTION:   Tube Feeding via Cortrak:    NUTRITION DIAGNOSIS:   Inadequate oral intake related to acute illness as evidenced by NPO status.  ***  GOAL:   Patient will meet greater than or equal to 90% of their needs  ***  MONITOR:   Vent status, Labs, Weight trends  REASON FOR ASSESSMENT:   Consult, Ventilator Enteral/tube feeding initiation and management (ECMO)  ASSESSMENT:   27 yo female admitted with acute respiratory failure due to severe asthma exacerbation requiring intubation with deep sedation, paralyzed and cannulated for VV ECMO. PMH includes asthma, AR, eczema  2/05 Admitted, Intubated, deep sedation and paralyzed on nimbex gtt 2/06 VV ECMO cannulation at 12 am 2/07 Cortrak (gastric), Trickle TF, off nimbex 2/08 VV ECMO decannulation  Pt remains on vent support, daily SBT, did not tolerate yesterday due to tachycardia, pt hypertensive  Tolerating Vital 1.5 at 55 ml/hr via Cortrak, Pro-Source TF20 60 mL BID.   Banatrol TF BID, +stool via rectal tube but only 70  mL in 24 hours. Noted rectal tube removed today   UOP 3.26 L in 24 hours  Labs: reviewed; sodium wdl, Creatinine wdl Meds: decadron, colace, ss novolog, miralax  NUTRITION - FOCUSED PHYSICAL EXAM:  {RD Focused Exam List:21252}  Diet Order:   Diet Order             Diet NPO time specified Except for: Sips with Meds  Diet effective midnight                   EDUCATION NEEDS:   Not appropriate for education at this time  Skin:  Skin Assessment: Reviewed RN Assessment  Last BM:  2/12  Height:   Ht Readings from Last 1 Encounters:  02/07/22 5' (1.524 m)    Weight:   Wt Readings from Last 1 Encounters:  02/17/22 75.6 kg    Ideal Body Weight:     BMI:  Body mass index is 32.55 kg/m.  Estimated Nutritional Needs:   Kcal:  2000-2200 kcals  Protein:  115-135 g  Fluid:  >/= 2L   Kerman Passey MS,  RDN, LDN, CNSC Registered Dietitian 3 Clinical Nutrition RD Pager and On-Call Pager Number Located in [place]

## 2022-02-17 NOTE — Progress Notes (Signed)
NAME:  Kaitlyn Mcneil, MRN:  [id number], DOB:  [birth year], LOS: 50 ADMISSION DATE:  02/07/2022, CONSULTATION DATE:  02/07/2022 REFERRING MD:  Zenia Resides, CHIEF COMPLAINT:  Dyspnea  History of Present Illness:  27 yo F with pertinent PMH of asthma, AR, and eczema who presented 2/5 with acute dyspnea. Tried home nebulizers/rescue inhalers without relief.  Had been exposed to others with URIs and has had fever, cough. Reportedly hospitalized in 12/2021 for asthma exacerbation.   In the ED, she was given continuous nebulizer, steroids, IV magnesium and placed on BiPAP.  She continued to have remarkably increased work of breathing, fatigue and decision was made with Dr. Zenia Resides to intubate.  Required a great deal of sedation and neuromuscular blockade to achieve adequate minute ventilation.   Was placed on ECMO for 48 hours on 2/6 and decannulated 2/8.  Continued an asthma treatment course on ventilator and began weaning off sedation 2/11, however experienced repeat episodes of tachycardia and hypertension when weaning and required addition of Precedex and clonidine.  Slowly progressing through sedation wean with goal of extubation, further complicated by episode of significant tracheal secretions production and coughing requiring resedation.  Pertinent  Medical History  - Asthma - AR - Eczema  Significant Hospital Events: Including procedures, antibiotic start and stop dates in addition to other pertinent events   2/5 - Intubated with NMB, deep sedation, Nimbex 2/6 - [facility] 29F VV-ECMO 2/8 - Decannulated  2/11 - Weaning off sedation but having episodes of hypertension and tachycardia 2/13 - Continuing to wean off sedation with hypertension, starting clonidine and Precedex 2/14 - Finishing sedation wean and planning to extubate when following instructions; increased frothy secretions from trachea, acutely hypertensive and tachycardic; resedated. 2/15 - Bronchoscopy performed, minimal mucus  secretions noted.  Continuing to wean sedation and pulling Foley along with arterial line given leukocytosis.  Interim History / Subjective:  Patient now weaning off sedation again, able to intermittently follow instructions with delay.  Does bite down on tube during bronchoscopy.  Did have sediment in Foley this morning.  Objective   Blood pressure (!) 150/103, pulse 65, temperature 98.7 F (37.1 C), temperature source Oral, resp. rate (!) 27, height 5' (1.524 m), weight 75.6 kg, SpO2 94 %. CVP:  [3 mmHg-13 mmHg] 7 mmHg  Vent Mode: PRVC FiO2 (%):  [40 %] 40 % Set Rate:  [27 bmp] 27 bmp Vt Set:  [360 mL] 360 mL PEEP:  [5 cmH20] 5 cmH20 Pressure Support:  [8 cmH20] 8 cmH20 Plateau Pressure:  [19 cmH20] 19 cmH20   Intake/Output Summary (Last 24 hours) at 02/17/2022 0726 Last data filed at 02/17/2022 0700 Gross per 24 hour  Intake 1588.89 ml  Output 3330 ml  Net -1741.11 ml   Filed Weights   02/15/22 0500 02/16/22 0437 02/17/22 0500  Weight: 76 kg 78.1 kg 75.6 kg   Examination: General: Young female. Sedated and intubated. No acute distress. HEENT: PERRLA, tracking movements and opens eyes to stimulation. Lungs: Some referred upper airway sounds, no wheezing. Cardiovascular: Normal S1/S2, borderline tachycardic. 2/6 RUSB holosystolic murmur. No rubs or gallops. Abdomen: Nondistended, soft. GU: Foley in place, draining urine with some white sediment. Extremities: Trace bipedal edema. Well perfused, capillary refill <2 seconds. Skin: No rashes or ecchymoses. Neuro: Follows some instructions with delay.  [facility]  EKG 2/15: 1st degree heart block, sinus rhythm  Na stable, 140 K steady, 3.8 > 3.9 WBC elevated and steady, 20.7 > 25.1 > 25.0 Hgb steady, 9.9  Assessment & Plan:  Acute hypoxic and hypercapnic respiratory failure in setting of acute asthma exacerbation 2/2 rhinovirus Asthma exacerbation resolved.  Continues to wean off sedation, but still  hypertensive and tachycardic, likely due to young age and prolonged sedation.  Will work towards extubation again when more alert. Bronchoscopy performed, showing minimal mucous secretions in tube, no mucus plugs in bronchi. - On PRVC, weaning sedation as tolerated - SBT when following instructions consistently - Continue nebulizer regimen - Singulair 10 mg daily - Dexamethasone taper   Hypertension, tachycardia 1st degree heart block Permissive HTN, goal SBP <160 to facilitate sedation wean. - d/c IV diltiazem given 1st degree heart block on EKG, maintain PO meds - Precedex for wean as well   Leukocytosis Suspect WBC trending up is sign of possible infection.  Sources include arterial line as well as Foley or ventilator.  Lines most likely given duration of use.  Arterial line will be pulled.  Foley removed.  Working on extubating. - Daily CBC - Foley removal ordered - Foley sediment Cx and UA   Hyperglycemia 2/2 steroid use CBGs stable. - SSI - Dexamethasone taper, almost finished   Anemia of chronic illness Malnutrition risk on tube feeds CTM, Hgb stable. - Tube feeds per RD - Daily CBC   Best Practice (right click and "Reselect all SmartList Selections" daily)   Diet/type: tubefeeds and NPO w/ meds via tube DVT prophylaxis: LMWH, SCDs GI prophylaxis: PPI Lines: Arterial Line and No longer needed.  Order written to d/c  Foley:  Yes, and it is no longer needed and removal ordered  Code Status:  full code Last date of multidisciplinary goals of care discussion: Family updated and bronch consented 2/14    Jahaan Vanwagner [place], MS4 02/17/2022, 7:26 AM Johnell Comings, PCCM

## 2022-02-17 NOTE — Progress Notes (Signed)
PT Cancellation Note  Patient Details Name: Kaitlyn Mcneil MRN: [id number] DOB: 20-Aug-1995   Cancelled Treatment:    Reason Eval/Treat Not Completed: (P) Other (comment). RN reporting pt is very confused and anxious with poor awareness of the situation, requesting PT hold off today. Will plan to follow-up tomorrow as able.   Moishe Spice, PT, DPT Acute Rehabilitation Services  Office: [place] 02/17/2022, 1:52 PM

## 2022-02-17 NOTE — Progress Notes (Signed)
[facility] Progress Note Patient Name: Trenton Atalla DOB: August 11, 1995 MRN: [id number]   Date of Service  02/17/2022  HPI/Events of Note  Patient is bradycardic with pauses on Cardizem gtt  eICU Interventions  Cardizem gtt stopped and PRN Hydralazine 10 -20 mg Q 4 hours ordered for blood pressure control.        Kerry Kass Spike Desilets 02/17/2022, 2:31 AM

## 2022-02-18 DIAGNOSIS — J9601 Acute respiratory failure with hypoxia: Secondary | ICD-10-CM

## 2022-02-18 DIAGNOSIS — J45909 Unspecified asthma, uncomplicated: Secondary | ICD-10-CM

## 2022-02-18 DIAGNOSIS — J4552 Severe persistent asthma with status asthmaticus: Secondary | ICD-10-CM

## 2022-02-18 DIAGNOSIS — G9341 Metabolic encephalopathy: Secondary | ICD-10-CM

## 2022-02-18 LAB — HEPATIC FUNCTION PANEL
ALT: 124 U/L — ABNORMAL HIGH (ref 0–44)
AST: 41 U/L (ref 15–41)
Albumin: 2.8 g/dL — ABNORMAL LOW (ref 3.5–5.0)
Alkaline Phosphatase: 48 U/L (ref 38–126)
Bilirubin, Direct: 0.1 mg/dL (ref 0.0–0.2)
Indirect Bilirubin: 0.2 mg/dL — ABNORMAL LOW (ref 0.3–0.9)
Total Bilirubin: 0.3 mg/dL (ref 0.3–1.2)
Total Protein: 5.7 g/dL — ABNORMAL LOW (ref 6.5–8.1)

## 2022-02-18 LAB — URINE CULTURE: Culture: NO GROWTH

## 2022-02-18 LAB — CBC
HCT: 31.7 % — ABNORMAL LOW (ref 36.0–46.0)
Hemoglobin: 10.8 g/dL — ABNORMAL LOW (ref 12.0–15.0)
MCH: 28.1 pg (ref 26.0–34.0)
MCHC: 34.1 g/dL (ref 30.0–36.0)
MCV: 82.6 fL (ref 80.0–100.0)
Platelets: 269 10*3/uL (ref 150–400)
RBC: 3.84 MIL/uL — ABNORMAL LOW (ref 3.87–5.11)
RDW: 16.4 % — ABNORMAL HIGH (ref 11.5–15.5)
WBC: 38.1 10*3/uL — ABNORMAL HIGH (ref 4.0–10.5)
nRBC: 0.1 % (ref 0.0–0.2)

## 2022-02-18 LAB — GLUCOSE, CAPILLARY
Glucose-Capillary: 129 mg/dL — ABNORMAL HIGH (ref 70–99)
Glucose-Capillary: 145 mg/dL — ABNORMAL HIGH (ref 70–99)
Glucose-Capillary: 146 mg/dL — ABNORMAL HIGH (ref 70–99)
Glucose-Capillary: 155 mg/dL — ABNORMAL HIGH (ref 70–99)
Glucose-Capillary: 158 mg/dL — ABNORMAL HIGH (ref 70–99)
Glucose-Capillary: 164 mg/dL — ABNORMAL HIGH (ref 70–99)

## 2022-02-18 MED ORDER — DOCUSATE SODIUM 50 MG/5ML PO LIQD
100.0000 mg | Freq: Two times a day (BID) | ORAL | Status: DC
  Administered 2022-02-18 – 2022-02-20 (×4): 100 mg
  Filled 2022-02-18 (×5): qty 10

## 2022-02-18 MED ORDER — POLYETHYLENE GLYCOL 3350 17 G PO PACK
17.0000 g | PACK | Freq: Every day | ORAL | Status: DC
  Administered 2022-02-18 – 2022-02-20 (×3): 17 g via ORAL
  Filled 2022-02-18 (×3): qty 1

## 2022-02-18 NOTE — Progress Notes (Signed)
Inpatient Rehab Admissions Coordinator Note:  Per therapy recommendations, pt was screened for candidacy for CIR by Michel Santee, PT.  At this time, pt does not appear to be able to tolerate the intensity of our CIR program at this time.  No consult recommended, however CIR will follow from a distance and if pt demonstrates improved tolerance we will request/place an order at that time per our protocol.    Shann Medal, PT, DPT [phone] 02/18/22  3:40 PM

## 2022-02-18 NOTE — Evaluation (Signed)
Clinical/Bedside Swallow Evaluation Patient Details  Name: Kaitlyn Mcneil MRN: [id number] Date of Birth: 1995/12/01  Today's Date: 02/18/2022 Time: SLP Start Time (ACUTE ONLY): 30 SLP Stop Time (ACUTE ONLY): 1646 SLP Time Calculation (min) (ACUTE ONLY): 21 min  Past Medical History:  Past Medical History:  Diagnosis Date   Asthma    Past Surgical History:  Past Surgical History:  Procedure Laterality Date   CANNULATION FOR ECMO (EXTRACORPOREAL MEMBRANE OXYGENATION) N/A 02/07/2022   Procedure: CANNULATION FOR ECMO (EXTRACORPOREAL MEMBRANE OXYGENATION);  Surgeon: Jolaine Artist, MD;  Location: [facility];  Service: Cardiovascular;  Laterality: N/A;   ECMO CANNULATION N/A 02/07/2022   Procedure: ECMO CANNULATION;  Surgeon: Jolaine Artist, MD;  Location: [facility];  Service: Cardiovascular;  Laterality: N/A;   TEE WITHOUT CARDIOVERSION N/A 02/07/2022   Procedure: TRANSESOPHAGEAL ECHOCARDIOGRAM;  Surgeon: Jolaine Artist, MD;  Location: [facility];  Service: Cardiovascular;  Laterality: N/A;   HPI:  Pt is a 27 year old female who presented to with acute hypoxic respiratory failure due to asthma exacerbation secondary to rhinovirus. ETT 2/5-2/15. VV-ECMO 2/6-2/8. PMH: asthma, eczema    Assessment / Plan / Recommendation  Clinical Impression  Pt was seen for bedside swallow evaluation and she denied a history of dysphagia. Pt's processing speed was reduced, but she was able to participate in conversation and she followed commands. Oral strength and ROM were WFL and her natural dentition was adequate. 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 She presented with symptoms of post-extubation dysphagia characterized by signs of aspiration with puree and larger boluses of thin liquids, and multiple swallows across consistencies. Diet initiation will be deferred, but pt may have ice chips  and water via spoon after oral care. Prognosis for diet initiation is judged to be good and SLP will follow to assess improvement. SLP Visit Diagnosis: Dysphagia, unspecified (R13.10)    Aspiration Risk  Mild aspiration risk    Diet Recommendation NPO;Ice chips PRN after oral care   Liquid Administration via: Spoon Medication Administration: Via alternative means    Other  Recommendations Oral Care Recommendations: Oral care prior to ice chip/H20    Recommendations for follow up therapy are one component of a multi-disciplinary discharge planning process, led by the attending physician.  Recommendations may be updated based on patient status, additional functional criteria and insurance authorization.  Follow up Recommendations  (TBD)      Assistance Recommended at Discharge    Functional Status Assessment Patient has had a recent decline in their functional status and demonstrates the ability to make significant improvements in function in a reasonable and predictable amount of time.  Frequency and Duration min 2x/week  2 weeks       Prognosis Prognosis for improved oropharyngeal function: Good Barriers to Reach Goals: Cognitive deficits;Severity of deficits      Swallow Study   General Date of Onset: 02/17/22 HPI: Pt is a 27 year old female who presented to with acute hypoxic respiratory failure due to asthma exacerbation secondary to rhinovirus. ETT 2/5-2/15. VV-ECMO 2/6-2/8. PMH: asthma, eczema Type of Study: Bedside Swallow Evaluation Previous Swallow Assessment: none Diet Prior to this Study: NPO;Cortrak/Small bore NG tube Temperature Spikes Noted: No Respiratory Status: Nasal cannula History of Recent Intubation: Yes Total duration of intubation (days): 10 days Date extubated: 02/17/22 Oral Cavity Assessment: Within Functional Limits Oral Care Completed by SLP: No Oral Cavity - Dentition: Adequate  natural dentition Vision: Functional for self-feeding Self-Feeding  Abilities: Total assist Patient Positioning: Upright in bed Baseline Vocal Quality: Low vocal intensity;Breathy Volitional Cough: Congested;Weak Volitional Swallow: Able to elicit    Oral/Motor/Sensory Function Overall Oral Motor/Sensory Function: Within functional limits   Ice Chips Ice chips: Within functional limits Presentation: Spoon   Thin Liquid Thin Liquid: Impaired Presentation: Cup;Straw;Spoon Pharyngeal  Phase Impairments: Throat Clearing - Immediate;Cough - Immediate;Cough - Delayed;Wet Vocal Quality;Multiple swallows    Nectar Thick Nectar Thick Liquid: Not tested   Honey Thick Honey Thick Liquid: Not tested   Puree Puree: Impaired Pharyngeal Phase Impairments: Multiple swallows;Throat Clearing - Delayed   Solid     Solid: Not tested     Kaitlyn Mcneil I. Kaitlyn Mcneil, [place], [place] Office number [phone]  Kaitlyn Mcneil 02/18/2022,4:53 PM

## 2022-02-18 NOTE — TOC Initial Note (Signed)
Transition of Care [facility]) - Initial/Assessment Note    Patient Details  Name: Kaitlyn Mcneil MRN: [id number] Date of Birth: [birth year]  Transition of [facility] CM/SW Contact:    Bethena Roys, RN Phone Number: 02/18/2022, 3:11 PM  Clinical Narrative: Patient is a transfer from [place]. Patient was discussed in progression rounds. Patient presented for acute hypoxic respiratory failure-intubated and extubated 02-17-22. PT/OT recommendations for CIR. Case Manager  will continue to follow for transition of care needs as the patient progresses.             Expected Discharge Plan: IP Rehab Facility Barriers to Discharge: Continued Medical Work up   Patient Goals and CMS Choice Patient states their goals for this hospitalization and ongoing recovery are:: wants pt to recover CMS Medicare.gov Compare Post Acute Care list provided to:: Patient Represenative (must comment) (mother)        Expected Discharge Plan and Services In-house Referral: NA Discharge Planning Services: CM Consult Post Acute Care Choice: IP Rehab Living arrangements for the past 2 months: Apartment                   DME Agency: NA    Prior Living Arrangements/Services Living arrangements for the past 2 months: Apartment Lives with:: Self Patient language and need for interpreter reviewed:: Yes        Need for Family Participation in Patient Care: Yes (Comment) Care giver support system in place?: Yes (comment)   Criminal Activity/Legal Involvement Pertinent to Current Situation/Hospitalization: No - Comment as needed  Activities of Daily Living Home Assistive Devices/Equipment: None ADL Screening (condition at time of admission) Patient's cognitive ability adequate to safely complete daily activities?: No Is the patient deaf or have difficulty hearing?: No Does the patient have difficulty seeing, even when wearing glasses/contacts?: No Does the patient have difficulty concentrating,  remembering, or making decisions?: No Patient able to express need for assistance with ADLs?: Yes Does the patient have difficulty dressing or bathing?: No Independently performs ADLs?: Yes (appropriate for developmental age) Does the patient have difficulty walking or climbing stairs?: No Weakness of Legs: None Weakness of Arms/Hands: None  Permission Sought/Granted Permission sought to share information with : Case Manager, Family Supports    Share Information with NAME: Lamiracle Owens     Permission granted to share info w Relationship: mother  Permission granted to share info w Contact Information: [phone]  Emotional Assessment Appearance:: Appears stated age Attitude/Demeanor/Rapport: Intubated (Following Commands or Not Following Commands)     Alcohol / Substance Use: Not Applicable Psych Involvement: No (comment)  Admission diagnosis:  Acute respiratory distress [R06.03] Respiratory failure, unspecified chronicity, unspecified whether with hypoxia or hypercapnia ([place]) [J96.90] Patient Active Problem List   Diagnosis Date Noted   Acute respiratory distress 02/07/2022   Severe asthma with acute exacerbation 02/07/2022   PCP:  Patient, No Pcp Per Pharmacy:   [facility] ([place]), [place] - [address] [place] [address] Phone: [phone] Fax: [phone]   Social Determinants of Health (SDOH) Social History: SDOH Screenings   Tobacco Use: Low Risk  (02/10/2022)   SDOH Interventions:     Readmission Risk Interventions     No data to display

## 2022-02-18 NOTE — Progress Notes (Signed)
NAME:  Kaitlyn Mcneil, MRN:  [id number], DOB:  January 03, 1996, LOS: 32 ADMISSION DATE:  02/07/2022, CONSULTATION DATE:  02/07/2022 REFERRING MD:  Zenia Resides, CHIEF COMPLAINT:  Dyspnea  History of Present Illness:  27 yo F with pertinent PMH of asthma, AR, and eczema who presented 2/5 with acute dyspnea. Tried home nebulizers/rescue inhalers without relief.  Had been exposed to others with URIs and has had fever, cough. Reportedly hospitalized in 12/2021 for asthma exacerbation.   In the ED, she was given continuous nebulizer, steroids, IV magnesium and placed on BiPAP.  She continued to have remarkably increased work of breathing, fatigue and decision was made with Dr. Zenia Resides to intubate.  Required a great deal of sedation and neuromuscular blockade to achieve adequate minute ventilation.   Was placed on ECMO for 48 hours on 2/6 and decannulated 2/8.  Continued an asthma treatment course on ventilator and began weaning off sedation 2/11, however experienced repeat episodes of tachycardia and hypertension when weaning and required addition of Precedex and clonidine.  Slowly progressing through sedation wean with goal of extubation, further complicated by episode of significant tracheal secretions production and coughing requiring resedation.  Successfully extubated 2/15, remains delirious and agitated with tachycardia.  Pertinent  Medical History  - Asthma - AR - Eczema  Significant Hospital Events: Including procedures, antibiotic start and stop dates in addition to other pertinent events   2/5 - Intubated with NMB, deep sedation, Nimbex 2/6 - [facility] 40F VV-ECMO 2/8 - Decannulated  2/11 - Weaning off sedation but having episodes of hypertension and tachycardia 2/13 - Continuing to wean off sedation with hypertension, starting clonidine and Precedex 2/14 - Finishing sedation wean and planning to extubate when following instructions; increased frothy secretions from trachea, acutely hypertensive and  tachycardic; resedated. 2/15 - Bronchoscopy performed, minimal mucus secretions noted.  Continuing to wean sedation and pulling Foley along with arterial line given leukocytosis. 2/16 - Still somewhat delirious. WBC still trending up, blood Cx and LFTs ordered.  Interim History / Subjective:  Is still delirious this morning and struggles to answer questions, but is able to communicate.  Follows instructions, but struggles to raise arms.  Endorses seeing strangers in her room repeatedly overnight, reports seeing girl in corner wanting to attack her.  Currently on Precedex 0.5, but no other sedation or pressors.  Has required I&O cath 2x and is not spontaneously voiding yet.  Objective   Blood pressure (!) 168/92, pulse (!) 146, temperature 98.6 F (37 C), temperature source Oral, resp. rate 16, height 5' (1.524 m), weight 74.2 kg, SpO2 96 %.    Vent Mode: CPAP;PSV FiO2 (%):  [40 %] 40 % Set Rate:  [27 bmp] 27 bmp Vt Set:  [360 mL] 360 mL PEEP:  [5 cmH20] 5 cmH20 Pressure Support:  [7 cmH20] 7 cmH20 Plateau Pressure:  [19 cmH20] 19 cmH20   Intake/Output Summary (Last 24 hours) at 02/18/2022 0729 Last data filed at 02/18/2022 0700 Gross per 24 hour  Intake 1141.67 ml  Output 1775 ml  Net -633.33 ml   Filed Weights   02/16/22 0437 02/17/22 0500 02/18/22 0425  Weight: 78.1 kg 75.6 kg 74.2 kg   Examination: General: Drowsy adult female, resting in bed in no acute distress. HEENT: PERRLA, pupils mildly constricted. Tracks movement. Lungs: Referred upper airway sounds but no wheezing. Normal WOB on 4 L [place]. Cardiovascular: Tachycardic, regular rhythm. Normal S1/S2. No murmurs/rubs. Abdomen: Nontenter to palpation, mild distension. Extremities: Warm, well perfused. Trace peripheral edema. Capillary refill <  2 seconds. Skin: Warm, dry. No rashes grossly. Neuro: A&O to self, but not place or time.  Follows instructions but weak to raise arms or wiggle toes. Psych: +Visual hallucinations. Flat  affect. Staring into distance, fixated on single point.  [facility]  UA: Large Hgb, no leukocytes/nitrite/protein  WBCs trending up, 25.1 > 25.0 > 38.1 Hgb steady, 10.8  Resolved Hospital Problems  - Hypernatremia - Asthma exacerbation 2/2 rhinovirus  Assessment & Plan:  Leukocytosis WBC continuing to trend up, concerning for infection.  Arterial and central lines removed, Foley out as well.  UA not consistent with UTI.  Off ventilator.  Steroids already tapered. - Trend daily CBC - LFTs to evaluate for biliary pathology - Blood Cx - Urine culture pending - Low threshold for Abx if fevers  Agitated delirium s/p prolonged sedation - Clonazepam taper 1 mg today, 0.5 mg starting tomorrow and until 2/19 - Clonidine 0.1 mg BID - Precedex 0.5 mg continuing - Delirium precautions, mother to visit tonight, focusing on redirection and reorientation  Acute hypoxic and hypercapnic respiratory failure in setting of acute asthma exacerbation 2/2 rhinovirus Asthma exacerbation resolved.  Now extubated, on 4 L [place]. - Continue nebulizer regimen - Singulair 10 mg daily - Dexamethasone taper completed  Hypertension, tachycardia Continues to be hypertensive and tachycardic, suspect due to side effects of medication wean after prolonged sedation course. - Diltiazem PO - Precedex 0.5 continuing   Hyperglycemia 2/2 steroid use CBGs stable on ~27 units short acting insulin daily. - SSI - Dexamethasone taper finished  Malnutrition risk s/p tube feeds CTM, Hgb stable or increasing. - SLP eval and advance diet as tolerated   Best Practice (right click and "Reselect all SmartList Selections" daily)   Diet/type: NPO, ice chips; pending SLP evaluation DVT prophylaxis: LMWH, SDCs d/c GI prophylaxis: PPI Lines: N/A Foley:  N/A Code Status:  full code Last date of multidisciplinary goals of care discussion: Mother to visit 2/16 PM   Raychell Holcomb [place], MS4 02/18/2022, 7:29  AM Johnell Comings, PCCM

## 2022-02-18 NOTE — Evaluation (Signed)
Physical Therapy Evaluation Patient Details Name: Kaitlyn Mcneil MRN: [id number] DOB: [birth year] Today's Date: 02/18/2022  History of Present Illness  Pt is 27 year old presented to [facility] on  02/07/22 with acute hypoxic respiratory failure due to asthma exacerbation secondary to rhinovirus. Intubated 2/5-2/15. VV-ECMO 2/6-2/8. PMH - asthma, eczema  Clinical Impression  Pt admitted with above diagnosis and presents to PT with functional limitations due to deficits listed below (See PT problem list). Pt needs skilled PT to maximize independence and safety to allow discharge to AIR for further rehab. Pt indpendent prior to admission and now with dependencies with all mobility. UE's with greater weakness than LE's.         Recommendations for follow up therapy are one component of a multi-disciplinary discharge planning process, led by the attending physician.  Recommendations may be updated based on patient status, additional functional criteria and insurance authorization.  Follow Up Recommendations Acute inpatient rehab (3hours/day)      Assistance Recommended at Discharge Frequent or constant Supervision/Assistance  Patient can return home with the following  Two people to help with walking and/or transfers;Two people to help with bathing/dressing/bathroom;Assist for transportation;Assistance with cooking/housework    Equipment Recommendations Other (comment) (To be determined)  Recommendations for Other Services  Rehab consult    Functional Status Assessment Patient has had a recent decline in their functional status and demonstrates the ability to make significant improvements in function in a reasonable and predictable amount of time.     Precautions / Restrictions Precautions Precautions: Fall Restrictions Weight Bearing Restrictions: No      Mobility  Bed Mobility Overal bed mobility: Needs Assistance Bed Mobility: Supine to Sit, Sit to Supine     Supine to sit: Total  assist Sit to supine: Total assist   General bed mobility comments: Assist for all aspects. Pt able to assist with moving legs on/off the bed    Transfers                        Ambulation/Gait                  Stairs            Wheelchair Mobility    Modified Rankin (Stroke Patients Only)       Balance Overall balance assessment: Needs assistance Sitting-balance support: Bilateral upper extremity supported, Feet unsupported Sitting balance-Leahy Scale: Poor Sitting balance - Comments: Sat EOB x 10 minutes with min to mod assist. Postural control: Posterior lean, Left lateral lean                                   Pertinent Vitals/Pain Pain Assessment Facial Expression: Relaxed, neutral Body Movements: Absence of movements Muscle Tension: Relaxed Compliance with ventilator (intubated pts.): N/A Vocalization (extubated pts.): Talking in normal tone or no sound CPOT Total: 0    Home Living Family/patient expects to be discharged to:: Private residence Living Arrangements: Parent   Type of Home: House (townhouse) Home Access: Level entry     Alternate Level Stairs-Number of Steps: flight Home Layout: Two level;Bed/bath upstairs;1/2 bath on main level Home Equipment: None Additional Comments: Information per patient. Will need to confirm    Prior Function Prior Level of Function : Independent/Modified Independent;Working/employed;Driving                     Hand Dominance  Extremity/Trunk Assessment   Upper Extremity Assessment Upper Extremity Assessment: Defer to OT evaluation    Lower Extremity Assessment Lower Extremity Assessment: RLE deficits/detail;LLE deficits/detail RLE Deficits / Details: grossly 2+/5 strength LLE Deficits / Details: grossly 2+/5 strength       Communication   Communication: Expressive difficulties (low volume, mumbles)  Cognition Arousal/Alertness: Awake/alert Behavior  During Therapy: Flat affect Overall Cognitive Status: Impaired/Different from baseline Area of Impairment: Orientation, Attention, Memory, Following commands, Safety/judgement, Awareness, Problem solving                 Orientation Level: Disoriented to, Time, Situation (thought she had been in accident) Current Attention Level: Sustained Memory: Decreased short-term memory Following Commands: Follows one step commands with increased time, Follows one step commands inconsistently Safety/Judgement: Decreased awareness of safety, Decreased awareness of deficits Awareness: Intellectual Problem Solving: Decreased initiation, Slow processing, Difficulty sequencing, Requires verbal cues, Requires tactile cues          General Comments General comments (skin integrity, edema, etc.): VSS    Exercises     Assessment/Plan    PT Assessment Patient needs continued PT services  PT Problem List Decreased strength;Decreased activity tolerance;Decreased balance;Decreased mobility;Decreased cognition       PT Treatment Interventions DME instruction;Gait training;Stair training;Functional mobility training;Therapeutic activities;Therapeutic exercise;Balance training;Neuromuscular re-education;Cognitive remediation;Patient/family education    PT Goals (Current goals can be found in the Care Plan section)  Acute Rehab PT Goals Patient Stated Goal: Pt did not state PT Goal Formulation: Patient unable to participate in goal setting Time For Goal Achievement: 03/04/22 Potential to Achieve Goals: Good    Frequency Min 3X/week     Co-evaluation               AM-PAC PT "6 Clicks" Mobility  Outcome Measure Help needed turning from your back to your side while in a flat bed without using bedrails?: Total Help needed moving from lying on your back to sitting on the side of a flat bed without using bedrails?: Total Help needed moving to and from a bed to a chair (including a  wheelchair)?: Total Help needed standing up from a chair using your arms (e.g., wheelchair or bedside chair)?: Total Help needed to walk in hospital room?: Total Help needed climbing 3-5 steps with a railing? : Total 6 Click Score: 6    End of Session   Activity Tolerance: Patient limited by fatigue Patient left: in bed;with call bell/phone within reach;with bed alarm set Nurse Communication: Mobility status PT Visit Diagnosis: Other abnormalities of gait and mobility (R26.89);Muscle weakness (generalized) (M62.81)    Time: [id number] PT Time Calculation (min) (ACUTE ONLY): 26 min   Charges:   PT Evaluation $PT Eval Moderate Complexity: [place] [facility] [place] 02/18/2022, 1:13 PM

## 2022-02-18 NOTE — Evaluation (Signed)
Occupational Therapy Evaluation Patient Details Name: Kaitlyn Mcneil MRN: [id number] DOB: 05-28-1995 Today's Date: 02/18/2022   History of Present Illness Pt is 27 year old presented to [facility] on  02/07/22 with acute hypoxic respiratory failure due to asthma exacerbation secondary to rhinovirus. Intubated 2/5-2/15. VV-ECMO 2/6-2/8. PMH - asthma, eczema   Clinical Impression   Patient admitted for the diagnosis above.  PTA she lived at home with her parent, and needed no assist for ADL, iADL or mobility.  Currently needing total A for all aspects of mobility and ADL completion at bedlevel.  Given patient's age and prior level of function, she will hopefully progress over time to baseline.  OT indicated in the acute setting to address deficits listed.  AIR is recommended for initial screen and to follow for appropriateness.        Recommendations for follow up therapy are one component of a multi-disciplinary discharge planning process, led by the attending physician.  Recommendations may be updated based on patient status, additional functional criteria and insurance authorization.   Follow Up Recommendations  Acute inpatient rehab (3hours/day)     Assistance Recommended at Discharge Frequent or constant Supervision/Assistance  Patient can return home with the following Two people to help with walking and/or transfers;Two people to help with bathing/dressing/bathroom    Functional Status Assessment  Patient has had a recent decline in their functional status and demonstrates the ability to make significant improvements in function in a reasonable and predictable amount of time.  Equipment Recommendations  None recommended by OT    Recommendations for Other Services       Precautions / Restrictions Precautions Precautions: Fall Restrictions Weight Bearing Restrictions: No      Mobility Bed Mobility Overal bed mobility: Needs Assistance       Supine to sit: Total assist Sit  to supine: Total assist     Patient Response: Cooperative  Transfers                          Balance Overall balance assessment: Needs assistance Sitting-balance support: Bilateral upper extremity supported, Feet unsupported Sitting balance-Leahy Scale: Poor   Postural control: Posterior lean, Left lateral lean                                 ADL either performed or assessed with clinical judgement   ADL                                         General ADL Comments: Total A for all aspects of ADL including grooming at bed level     Vision   Vision Assessment?: Vision impaired- to be further tested in functional context     Perception     Praxis      Pertinent Vitals/Pain Pain Assessment Pain Assessment: Faces Faces Pain Scale: No hurt Pain Intervention(s): Monitored during session     Hand Dominance     Extremity/Trunk Assessment Upper Extremity Assessment Upper Extremity Assessment: Generalized weakness   Lower Extremity Assessment Lower Extremity Assessment: Defer to PT evaluation RLE Deficits / Details: grossly 2+/5 strength LLE Deficits / Details: grossly 2+/5 strength       Communication Communication Communication: Expressive difficulties   Cognition Arousal/Alertness: Lethargic Behavior During Therapy: Flat affect Overall Cognitive Status: No family/caregiver  present to determine baseline cognitive functioning                                       General Comments  VSS    Exercises     Shoulder Instructions      Home Living Family/patient expects to be discharged to:: Private residence Living Arrangements: Parent Available Help at Discharge: Family;Available 24 hours/day Type of Home: House Home Access: Level entry     Home Layout: Two level;Bed/bath upstairs;1/2 bath on main level Alternate Level Stairs-Number of Steps: flight   Bathroom Shower/Tub: Tub/shower unit;Walk-in  shower   Bathroom Toilet: Standard     Home Equipment: None   Additional Comments: Information per patient. Will need to confirm      Prior Functioning/Environment Prior Level of Function : Independent/Modified Independent;Working/employed;Driving                        OT Problem List: Decreased strength;Decreased activity tolerance;Impaired balance (sitting and/or standing);Impaired UE functional use      OT Treatment/Interventions: Self-care/ADL training;Therapeutic exercise;Therapeutic activities;DME and/or AE instruction;Patient/family education;Balance training;Cognitive remediation/compensation    OT Goals(Current goals can be found in the care plan section) Acute Rehab OT Goals OT Goal Formulation: Patient unable to participate in goal setting Time For Goal Achievement: 03/04/22 Potential to Achieve Goals: Fair ADL Goals Pt Will Perform Grooming: sitting;with mod assist Pt/caregiver will Perform Home Exercise Program: Increased strength;Both right and left upper extremity;With minimal assist Additional ADL Goal #1: Tolerate edge of bed sitting with Min A of one for up to 5 min to increase ADL Ind  OT Frequency: Min 2X/week    Co-evaluation              AM-PAC OT "6 Clicks" Daily Activity     Outcome Measure Help from another person eating meals?: Total Help from another person taking care of personal grooming?: Total Help from another person toileting, which includes using toliet, bedpan, or urinal?: Total Help from another person bathing (including washing, rinsing, drying)?: Total Help from another person to put on and taking off regular upper body clothing?: Total Help from another person to put on and taking off regular lower body clothing?: Total 6 Click Score: 6   End of Session Equipment Utilized During Treatment: Oxygen Nurse Communication: Mobility status  Activity Tolerance: Patient limited by fatigue Patient left: in bed;with call  bell/phone within reach;with bed alarm set  OT Visit Diagnosis: Muscle weakness (generalized) (M62.81);Other symptoms and signs involving cognitive function                Time: 1421-1436 OT Time Calculation (min): 15 min Charges:  OT General Charges $OT Visit: 1 Visit OT Evaluation $OT Eval Moderate Complexity: 1 Mod  02/18/2022  RP, OTR/L  Acute Rehabilitation Services  Office:  [phone]   Metta Clines 02/18/2022, 2:42 PM

## 2022-02-18 NOTE — Progress Notes (Signed)
   02/18/22 1200  Spiritual Encounters  Type of Visit Initial  Care provided to: Patient  Referral source Family  Reason for visit Routine spiritual support  OnCall Visit No   Ch responded to request for emotional and spiritual care. No family at bedside. Pt was sleeping. No follow-up needed at this time.

## 2022-02-19 ENCOUNTER — Telehealth: Payer: Self-pay | Admitting: Acute Care

## 2022-02-19 LAB — CBC
HCT: 29.4 % — ABNORMAL LOW (ref 36.0–46.0)
Hemoglobin: 9.7 g/dL — ABNORMAL LOW (ref 12.0–15.0)
MCH: 27.6 pg (ref 26.0–34.0)
MCHC: 33 g/dL (ref 30.0–36.0)
MCV: 83.8 fL (ref 80.0–100.0)
Platelets: 255 10*3/uL (ref 150–400)
RBC: 3.51 MIL/uL — ABNORMAL LOW (ref 3.87–5.11)
RDW: 16.5 % — ABNORMAL HIGH (ref 11.5–15.5)
WBC: 23.4 10*3/uL — ABNORMAL HIGH (ref 4.0–10.5)
nRBC: 0 % (ref 0.0–0.2)

## 2022-02-19 LAB — GLUCOSE, CAPILLARY
Glucose-Capillary: 128 mg/dL — ABNORMAL HIGH (ref 70–99)
Glucose-Capillary: 131 mg/dL — ABNORMAL HIGH (ref 70–99)
Glucose-Capillary: 139 mg/dL — ABNORMAL HIGH (ref 70–99)
Glucose-Capillary: 116 mg/dL — ABNORMAL HIGH (ref 70–99)

## 2022-02-19 MED ORDER — UMECLIDINIUM BROMIDE 62.5 MCG/ACT IN AEPB
1.0000 | INHALATION_SPRAY | Freq: Every day | RESPIRATORY_TRACT | Status: DC
  Administered 2022-02-22 – 2022-02-25 (×4): 1 via RESPIRATORY_TRACT
  Filled 2022-02-19 (×2): qty 7

## 2022-02-19 MED ORDER — MOMETASONE FURO-FORMOTEROL FUM 200-5 MCG/ACT IN AERO
2.0000 | INHALATION_SPRAY | Freq: Two times a day (BID) | RESPIRATORY_TRACT | Status: DC
  Administered 2022-02-20 – 2022-02-25 (×10): 2 via RESPIRATORY_TRACT
  Filled 2022-02-19 (×2): qty 8.8

## 2022-02-19 NOTE — Progress Notes (Signed)
NAME:  Kaitlyn Mcneil, MRN:  [id number], DOB:  1995/07/18, LOS: 12 ADMISSION DATE:  02/07/2022, CONSULTATION DATE:  02/07/2022 REFERRING MD:  Zenia Resides, CHIEF COMPLAINT:  Dyspnea  History of Present Illness:  27 yo F with pertinent PMH of asthma, AR, and eczema who presented 2/5 with acute dyspnea. Tried home nebulizers/rescue inhalers without relief.  Had been exposed to others with URIs and has had fever, cough. Reportedly hospitalized in 12/2021 for asthma exacerbation.   In the ED, she was given continuous nebulizer, steroids, IV magnesium and placed on BiPAP.  She continued to have remarkably increased work of breathing, fatigue and decision was made with Dr. Zenia Resides to intubate.  Required a great deal of sedation and neuromuscular blockade to achieve adequate minute ventilation.   Was placed on ECMO for 48 hours on 2/6 and decannulated 2/8.  Continued an asthma treatment course on ventilator and began weaning off sedation 2/11, however experienced repeat episodes of tachycardia and hypertension when weaning and required addition of Precedex and clonidine.  Slowly progressing through sedation wean with goal of extubation, further complicated by episode of significant tracheal secretions production and coughing requiring resedation.  Successfully extubated 2/15, remains delirious and agitated with tachycardia.  Pertinent  Medical History  - Asthma - AR - Eczema  Significant Hospital Events: Including procedures, antibiotic start and stop dates in addition to other pertinent events   2/5 - Intubated with NMB, deep sedation, Nimbex 2/6 - [facility] 42F VV-ECMO 2/8 - Decannulated  2/11 - Weaning off sedation but having episodes of hypertension and tachycardia 2/13 - Continuing to wean off sedation with hypertension, starting clonidine and Precedex 2/14 - Finishing sedation wean and planning to extubate when following instructions; increased frothy secretions from trachea, acutely hypertensive and  tachycardic; resedated. 2/15 - Bronchoscopy performed, minimal mucus secretions noted.  Continuing to wean sedation and pulling Foley along with arterial line given leukocytosis. 2/16 - Still somewhat delirious. WBC still trending up, blood Cx and LFTs ordered. 2/17 Little sleep reported overnight but patient is less confused this am.   Interim History / Subjective:  States she feels better today and "feels less confused"  Objective   Blood pressure 132/76, pulse 87, temperature 99.3 F (37.4 C), temperature source Oral, resp. rate 15, height 5' (1.524 m), weight 74.2 kg, SpO2 98 %.        Intake/Output Summary (Last 24 hours) at 02/19/2022 0900 Last data filed at 02/19/2022 0800 Gross per 24 hour  Intake 1621.75 ml  Output 1275 ml  Net 346.75 ml    Filed Weights   02/16/22 0437 02/17/22 0500 02/18/22 0425  Weight: 78.1 kg 75.6 kg 74.2 kg   Examination: General: Acute deconditioned adult female lying in bed in NAD  HEENT: [place]/AT, MM pink/moist, PERRL,  Neuro: Alert and oriented x3, slightly confused to situation  CV: s1s2 regular rate and rhythm, no murmur, rubs, or gallops,  PULM:  Clear to auscultation, no increased work of breathing, no added breath sounds GI: soft, bowel sounds active in all 4 quadrants, non-tender, non-distended, tolerating TF Extremities: warm/dry, no edema  Skin: no rashes or lesions  Resolved Hospital Problems  - Hypernatremia - Asthma exacerbation 2/2 rhinovirus  Assessment & Plan:  Acute hypoxic and hypercapnic respiratory failure in setting of acute asthma exacerbation 2/2 rhinovirus -Asthma exacerbation resolved.  Now extubated, on 4 L [place]. P: Continue nebulized BDs  Wean supplemental oxygen as able  Mobilize as able  Encourage pulmonary hygiene   Acute metabolic encephalopathy  due to previous sedation   ICU delirium with visual hallucination  P: Continue scheduled Seroquel  PRN Haldol  Clonidine  Clonazepam taper   PRN Precedex   Delirium precautions  PT/OT efforts  Mobilize   Leukocytosis - improved as of 2/17 -WBC continuing to trend up, concerning for infection.  Arterial and central lines removed, Foley out as well.  UA not consistent with UTI.  Off ventilator.   Steroids already tapered. -Repeat cultures obtained 2/16 P: Continue to trend CBC and fever curve  Low threshold to start antibiotics  Follow cultures   Hypertension, tachycardia -Continues to be hypertensive and tachycardic, suspect due to side effects of medication wean after prolonged sedation course. P: Continue Ditilazem  Continuous telemetry  Try to avoid beta blockers in the setting of poorly controlled asthma   Hyperglycemia 2/2 steroid use -Hgb A1C 5.9 P: Continue SSI   Malnutrition risk s/p tube feeds -CTM, Hgb stable or increasing. P: SLP continues to follow  Continue TF via cortrack until safe for orals   Best Practice (right click and "Reselect all SmartList Selections" daily)   Diet/type: NPO, ice chips; pending SLP evaluation DVT prophylaxis: LMWH, SDCs d/c GI prophylaxis: PPI Lines: N/A Foley:  N/A Code Status:  full code Last date of multidisciplinary goals of care discussion: Mother to visit 2/16 PM   Khan Chura D. Harris, NP-C [facility] Personal contact information can be found on Amion  If no contact or response made please call [phone] 02/19/2022, 9:21 AM

## 2022-02-20 DIAGNOSIS — R000 Tachycardia, unspecified: Secondary | ICD-10-CM | POA: Insufficient documentation

## 2022-02-20 DIAGNOSIS — A419 Sepsis, unspecified organism: Secondary | ICD-10-CM | POA: Insufficient documentation

## 2022-02-20 DIAGNOSIS — R531 Weakness: Secondary | ICD-10-CM

## 2022-02-20 DIAGNOSIS — E669 Obesity, unspecified: Secondary | ICD-10-CM | POA: Insufficient documentation

## 2022-02-20 DIAGNOSIS — R410 Disorientation, unspecified: Secondary | ICD-10-CM | POA: Insufficient documentation

## 2022-02-20 DIAGNOSIS — E872 Acidosis, unspecified: Secondary | ICD-10-CM | POA: Insufficient documentation

## 2022-02-20 DIAGNOSIS — R6520 Severe sepsis without septic shock: Secondary | ICD-10-CM

## 2022-02-20 LAB — CBC
HCT: 32.3 % — ABNORMAL LOW (ref 36.0–46.0)
Hemoglobin: 10.6 g/dL — ABNORMAL LOW (ref 12.0–15.0)
MCH: 27.4 pg (ref 26.0–34.0)
MCHC: 32.8 g/dL (ref 30.0–36.0)
MCV: 83.5 fL (ref 80.0–100.0)
Platelets: 267 10*3/uL (ref 150–400)
RBC: 3.87 MIL/uL (ref 3.87–5.11)
RDW: 16.5 % — ABNORMAL HIGH (ref 11.5–15.5)
WBC: 23.3 10*3/uL — ABNORMAL HIGH (ref 4.0–10.5)
nRBC: 0 % (ref 0.0–0.2)

## 2022-02-20 LAB — GLUCOSE, CAPILLARY
Glucose-Capillary: 123 mg/dL — ABNORMAL HIGH (ref 70–99)
Glucose-Capillary: 154 mg/dL — ABNORMAL HIGH (ref 70–99)
Glucose-Capillary: 124 mg/dL — ABNORMAL HIGH (ref 70–99)
Glucose-Capillary: 137 mg/dL — ABNORMAL HIGH (ref 70–99)
Glucose-Capillary: 133 mg/dL — ABNORMAL HIGH (ref 70–99)
Glucose-Capillary: 125 mg/dL — ABNORMAL HIGH (ref 70–99)

## 2022-02-20 MED ORDER — LEVALBUTEROL HCL 0.63 MG/3ML IN NEBU
0.6300 mg | INHALATION_SOLUTION | Freq: Four times a day (QID) | RESPIRATORY_TRACT | Status: DC | PRN
  Filled 2022-02-20: qty 3

## 2022-02-20 MED ORDER — INSULIN ASPART 100 UNIT/ML IJ SOLN
0.0000 [IU] | Freq: Three times a day (TID) | INTRAMUSCULAR | Status: DC
  Administered 2022-02-20 – 2022-02-21 (×4): 1 [IU] via SUBCUTANEOUS
  Administered 2022-02-22: 2 [IU] via SUBCUTANEOUS
  Administered 2022-02-22: 1 [IU] via SUBCUTANEOUS
  Administered 2022-02-22 – 2022-02-23 (×2): 2 [IU] via SUBCUTANEOUS
  Administered 2022-02-23: 1 [IU] via SUBCUTANEOUS
  Administered 2022-02-23: 2 [IU] via SUBCUTANEOUS
  Administered 2022-02-25: 1 [IU] via SUBCUTANEOUS

## 2022-02-20 MED ORDER — INSULIN ASPART 100 UNIT/ML IJ SOLN: 0.0000 [IU] | Freq: Every day | INTRAMUSCULAR | Status: DC

## 2022-02-20 MED ORDER — POLYETHYLENE GLYCOL 3350 17 G PO PACK: 17.0000 g | PACK | Freq: Two times a day (BID) | ORAL | Status: DC | PRN

## 2022-02-20 MED ORDER — LORATADINE 10 MG PO TABS
10.0000 mg | ORAL_TABLET | Freq: Every day | ORAL | Status: DC
  Administered 2022-02-21 – 2022-02-25 (×5): 10 mg via ORAL
  Filled 2022-02-20 (×6): qty 1

## 2022-02-20 MED ORDER — SENNOSIDES-DOCUSATE SODIUM 8.6-50 MG PO TABS
1.0000 | ORAL_TABLET | Freq: Every day | ORAL | Status: DC
  Administered 2022-02-20 – 2022-02-22 (×3): 1 via ORAL
  Filled 2022-02-20 (×6): qty 1

## 2022-02-20 MED ORDER — MONTELUKAST SODIUM 10 MG PO TABS
10.0000 mg | ORAL_TABLET | Freq: Every day | ORAL | Status: DC
  Administered 2022-02-20 – 2022-02-24 (×5): 10 mg via ORAL
  Filled 2022-02-20 (×5): qty 1

## 2022-02-20 MED ORDER — DILTIAZEM HCL 60 MG PO TABS
90.0000 mg | ORAL_TABLET | Freq: Four times a day (QID) | ORAL | Status: DC
  Administered 2022-02-20 – 2022-02-22 (×8): 90 mg via ORAL
  Filled 2022-02-20 (×8): qty 1

## 2022-02-20 NOTE — Progress Notes (Signed)
Speech Language Pathology Treatment: Dysphagia  Patient Details Name: Kaitlyn Mcneil MRN: [id number] DOB: [birth year] Today's Date: 02/20/2022 Time: [id number] SLP Time Calculation (min) (ACUTE ONLY): 11 min  Assessment / Plan / Recommendation Clinical Impression  Pt was seen for dysphagia treatment with her mother present. Pt's vocal quality was improved compared to that noted during the evaluation on 2/16, suggesting reduced laryngeal edema considering that she is now 3 days post extubation. Pt reported that she has been tolerating the full liquid diet and meds whole with thin liquids without signs of aspiration. Pt tolerated dysphagia 3 solids, mixed consistency boluses (i.e., peaches with liquid), regular texture solids and thin liquids via straw using individual and consecutive swallows without overt s/s of aspiration. Mastication was [facility] and oral clearance adequate. Pt's diet will be advanced to regular texture solids and thin liquids. SLP will continue to follow pt.     HPI HPI: Pt is a 27 year old female who presented to with acute hypoxic respiratory failure due to asthma exacerbation secondary to rhinovirus. ETT 2/5-2/15. VV-ECMO 2/6-2/8. Pt evaluated by SLP 2/16 and an NPO status was recommended. Full liquid diet initiated 2/17 by PCCM and SLP contacted by hospitalist 2/18 for further diet advancement. PMH: asthma, eczema      SLP Plan  Continue with current plan of care      Recommendations for follow up therapy are one component of a multi-disciplinary discharge planning process, led by the attending physician.  Recommendations may be updated based on patient status, additional functional criteria and insurance authorization.    Recommendations  Diet recommendations: Regular;Thin liquid Liquids provided via: Cup;Straw Medication Administration: Whole meds with liquid Supervision: Patient able to self feed Compensations: Slow rate Postural Changes and/or Swallow Maneuvers:  Seated upright 90 degrees                Oral Care Recommendations: Oral care prior to ice chip/H20 Follow Up Recommendations: No SLP follow up SLP Visit Diagnosis: Dysphagia, unspecified (R13.10) Plan: Continue with current plan of care         Kaitlyn Mcneil, [place], [place] Office number [phone]  Horton Marshall  02/20/2022, 3:29 PM

## 2022-02-20 NOTE — Progress Notes (Signed)
PROGRESS NOTE  Kaitlyn Mcneil [id number] DOB: 10-Aug-1995   PCP: Pcp, No  Patient is from: Home  DOA: 02/07/2022 LOS: 63  Chief complaints Chief Complaint  Patient presents with   Shortness of Breath     Brief Narrative / Interim history: 27 yo F with PMH of asthma, allergic rhinitis, obesity and eczema who presented 2/5 with acute dyspnea that did not improve with home nebulizers and rescue inhalers.  She had a fever, cough and URI symptoms.  She had sick contacts.   In the ED, she was started on BiPAP, steroid and nebulizers.  However, she continued to have remarkably increased work of breathing and fatigue for which she was intubated.  She required a great deal of sedation and neuromuscular blockade to achieve adequate minute ventilation.  She was placed on ECMO for 48 hours on 2/6 and decannulated 2/8.  Continued an asthma treatment course on ventilator and began weaning off sedation.  She required Precedex and clonidine for agitation.  Eventually, successfully extubated on 2/15 but remained remains delirious with agitation and tachycardia.  Eventually, she came off Precedex, and transferred to [facility] hospitalist service on 2/18.  Patient is currently on room air.  Delirium seems to have resolved.  She is on full liquid diet as well as tube feed by cortrack.  Subjective: Seen and examined earlier this morning.  No major events overnight of this morning.  No specific complaints other than feeling weak.  Some shortness of breath with activity.  Denies chest pain  Objective: Vitals:   02/20/22 0700 02/20/22 0800 02/20/22 0900 02/20/22 1004  BP: (!) 115/59 126/80 123/69 120/60  Pulse: 94 (!) 110 100 99  Resp: 18 (!) 23 18 18  $ Temp:  98.4 F (36.9 C)  98.3 F (36.8 C)  TempSrc:  Oral  Oral  SpO2: 95% 96% 95% 97%  Weight:      Height:        Examination:  GENERAL: No apparent distress.  Nontoxic. HEENT: MMM.  Vision and hearing grossly intact.  NECK: Supple.  No  apparent JVD.  RESP:  No IWOB.  Fair aeration bilaterally.  Some rhonchi bilaterally. CVS:  RRR. Heart sounds normal.  ABD/GI/GU: BS+. Abd soft, NTND.  MSK/EXT:  Moves extremities but globally weak.  No edema.  SKIN: no apparent skin lesion or wound NEURO: Awake, alert and oriented x 4.  No apparent focal neuro deficit. PSYCH: Calm. Normal affect.   Procedures:  Intubation from 2/5-2/15 ECMO from 2/6-2/8  Microbiology summarized: COVID-19, influenza and RSV PCR nonreactive Full RVP panel positive for rhinovirus Blood cultures NGTD on 2/-2/15 Tracheal aspirate culture negative on 2/7 Urine culture negative on 2/15  Assessment and plan: Principal Problem:   Acute respiratory distress Active Problems:   Severe persistent asthma with status asthmaticus   Acute respiratory failure with hypoxia and hypercapnia (HCC)   Acute metabolic encephalopathy   Severe sepsis (HCC)   Sinus tachycardia   Delirium   Generalized weakness   Obesity (BMI 30-39.9)  Severe sepsis due to rhinovirus infection: POA.  Had tachycardia, tachypnea, respiratory failure with lactic acidosis on presentation.  Sepsis physiology resolving except for mild tachycardia and leukocytosis.   She was on meropenem 2/6-2/9 and vancomycin 2/6-2/8. Extensive infectious workup as above.  -Continues monitoring leukocytosis -Management as below  Acute respiratory failure with hypoxia and hypercapnia due to status asthmaticus, severe persistent allergic asthma.  RVP positive for rhinovirus.  Improved.  Respiratory failure seems to have resolved. -Intubation and  mechanical ventilation from 2/5-2/15 -ECMO from 2/6-2/8 -Completed steroid course -Continue Dulera and Incruse.  And Xopenex as needed -Continue antihistamine and Singulair; needs both prescribed at discharge -Needs inhaler education.  Will need assistance getting inhalers from [facility] after discharge. -Continue pulmonary hygiene -Pulmonology  arranging outpatient follow-up. -Has previously been taken off PPI; if she develops GERD symptoms she will need PPI.   Physical deconditioning: Likely due to acute illness. -OOB, PT/OT-recommended CIR.   Oropharyngeal dysphagia: Likely due to prolonged illness and intubation. -SLP recommended n.p.o. on 2/16.  It seems mentation improved and started on FLD -Will have SLP reassess -Continue full liquid diet and tube feed for now   Acute metabolic encephalopathy-multifactorial due to sedating medications, high-dose steroids.  Resolved.  She is currently oriented x 4. -Wean off clonidine and Klonopin slowly. -Wean off oxycodone as previously prescribed to prevent withdrawal   Sinus tachycardia: Improved. -On clonidine and Cardizem    Hyperglycemia: Likely steroid induced.  Hgb 5.9%. -Continue CBG monitoring and SSI.  Changed to ACHS.    Leukocytosis-no obvious source: Demargination?  Seems like she has been off steroid for quite some time now.  Extensive infectious workup unrevealing. -Continue monitoring  Obesity Body mass index is 31.95 kg/m. Nutrition Problem: Inadequate oral intake Etiology: acute illness Signs/Symptoms: NPO status Interventions: Refer to RD note for recommendations   DVT prophylaxis:  enoxaparin (LOVENOX) injection 40 mg Start: 02/12/22 1000 SCD's Start: 02/08/22 0300  Code Status: Full code Family Communication: None at bedside Level of care: Progressive Status is: Inpatient Remains inpatient appropriate because: Severe asthma exacerbation, physical deconditioning   Final disposition: CIR? Consultants:  Pulmonology admitted patient  55 minutes with more than 50% spent in reviewing records, counseling patient/family and coordinating care.   Sch Meds:  Scheduled Meds:  Chlorhexidine Gluconate Cloth  6 each Topical Daily   clonazePAM  0.5 mg Per Tube BID   cloNIDine  0.1 mg Oral BID   diltiazem  90 mg Per Tube Q6H   docusate  100 mg Per Tube  BID   enoxaparin (LOVENOX) injection  40 mg Subcutaneous Daily   feeding supplement (PROSource TF20)  60 mL Per Tube BID   insulin aspart  3-9 Units Subcutaneous Q4H   loratadine  10 mg Per Tube Daily   mometasone-formoterol  2 puff Inhalation BID   montelukast  10 mg Per Tube QHS   mouth rinse  15 mL Mouth Rinse 4 times per day   oxyCODONE  2.5 mg Per Tube Q6H   polyethylene glycol  17 g Oral Daily   umeclidinium bromide  1 puff Inhalation Daily   Continuous Infusions:  sodium chloride 500 mL (02/15/22 0246)   sodium chloride Stopped (02/17/22 1900)   feeding supplement (VITAL 1.5 CAL) 1,000 mL (02/20/22 1022)   PRN Meds:.sodium chloride, docusate, hydrALAZINE, levalbuterol, ondansetron (ZOFRAN) IV, mouth rinse, [COMPLETED] oxyCODONE **FOLLOWED BY** oxyCODONE **FOLLOWED BY** [START ON 02/21/2022] oxyCODONE, polyethylene glycol  Antimicrobials: Anti-infectives (From admission, onward)    Start     Dose/Rate Route Frequency Ordered Stop   02/08/22 2100  vancomycin (VANCOCIN) IVPB 1000 mg/200 mL premix  Status:  Discontinued        1,000 mg 200 mL/hr over 60 Minutes Intravenous Every 12 hours 02/08/22 1333 02/10/22 0750   02/08/22 0830  meropenem (MERREM) 1 g in sodium chloride 0.9 % 100 mL IVPB  Status:  Discontinued        1 g 200 mL/hr over 30 Minutes Intravenous Every  8 hours 02/08/22 0742 02/11/22 0819   02/08/22 0830  vancomycin (VANCOCIN) 2,000 mg in sodium chloride 0.9 % 500 mL IVPB        2,000 mg 260 mL/hr over 120 Minutes Intravenous  Once 02/08/22 0742 02/08/22 1052        I have personally reviewed the following labs and images: CBC: Recent Labs  Lab 02/16/22 0338 02/16/22 1728 02/17/22 0350 02/17/22 0351 02/18/22 0256 02/19/22 0053 02/20/22 0031  WBC 25.1*  --  25.0*  --  38.1* 23.4* 23.3*  HGB 9.5*   < > 9.2* 9.9* 10.8* 9.7* 10.6*  HCT 29.1*   < > 28.8* 29.0* 31.7* 29.4* 32.3*  MCV 83.9  --  84.5  --  82.6 83.8 83.5  PLT 206  --  225  --  269 255 267    < > = values in this interval not displayed.   BMP &GFR Recent Labs  Lab 02/14/22 0351 02/15/22 0342 02/15/22 0405 02/16/22 0337 02/16/22 0338 02/16/22 1728 02/17/22 0350 02/17/22 0351  NA 147* 142   < > 140 137 140 140 139  K 3.9 3.7   < > 3.9 3.8 4.0 3.7 3.9  CL 106 103  --   --  101  --  104  --   CO2 32 31  --   --  29  --  27  --   GLUCOSE 183* 170*  --   --  149*  --  130*  --   BUN 30* 22*  --   --  16  --  19  --   CREATININE 0.64 0.57  --   --  0.63  --  0.62  --   CALCIUM 8.5* 8.7*  --   --  8.5*  --  8.7*  --   MG  --   --   --   --   --   --  2.2  --    < > = values in this interval not displayed.   Estimated Creatinine Clearance: 96.7 mL/min (by C-G formula based on SCr of 0.62 mg/dL). Liver & Pancreas: Recent Labs  Lab 02/18/22 1454  AST 41  ALT 124*  ALKPHOS 48  BILITOT 0.3  PROT 5.7*  ALBUMIN 2.8*   No results for input(s): "LIPASE", "AMYLASE" in the last 168 hours. No results for input(s): "AMMONIA" in the last 168 hours. Diabetic: No results for input(s): "HGBA1C" in the last 72 hours. Recent Labs  Lab 02/19/22 1128 02/19/22 1547 02/20/22 0030 02/20/22 0455 02/20/22 0825  GLUCAP 139* 116* 123* 154* 124*   Cardiac Enzymes: No results for input(s): "CKTOTAL", "CKMB", "CKMBINDEX", "TROPONINI" in the last 168 hours. No results for input(s): "PROBNP" in the last 8760 hours. Coagulation Profile: No results for input(s): "INR", "PROTIME" in the last 168 hours. Thyroid Function Tests: No results for input(s): "TSH", "T4TOTAL", "FREET4", "T3FREE", "THYROIDAB" in the last 72 hours. Lipid Profile: No results for input(s): "CHOL", "HDL", "LDLCALC", "TRIG", "CHOLHDL", "LDLDIRECT" in the last 72 hours. Anemia Panel: No results for input(s): "VITAMINB12", "FOLATE", "FERRITIN", "TIBC", "IRON", "RETICCTPCT" in the last 72 hours. Urine analysis:    Component Value Date/Time   COLORURINE YELLOW 02/17/2022 1205   APPEARANCEUR TURBID (A) 02/17/2022 1205    LABSPEC 1.015 02/17/2022 1205   PHURINE 7.0 02/17/2022 1205   GLUCOSEU NEGATIVE 02/17/2022 1205   HGBUR LARGE (A) 02/17/2022 1205   BILIRUBINUR NEGATIVE 02/17/2022 1205   KETONESUR NEGATIVE 02/17/2022 1205   PROTEINUR NEGATIVE 02/17/2022  [place] 02/17/2022 [place] 02/17/2022 1205   Sepsis Labs: Invalid input(s): "PROCALCITONIN", "LACTICIDVEN"  Microbiology: Recent Results (from the past 240 hour(s))  Culture, Urine (Do not remove urinary catheter, catheter placed by urology or difficult to place)     Status: None   Collection Time: 02/17/22 12:05 PM   Specimen: Urine, Catheterized  Result Value Ref Range Status   Specimen Description URINE, CATHETERIZED  Final   Special Requests NONE  Final   Culture   Final    NO GROWTH Performed at [facility], [address], [place], [place] [address]    Report Status 02/18/2022 FINAL  Final  Culture, blood (Routine X 2) w Reflex to ID Panel     Status: None (Preliminary result)   Collection Time: 02/18/22  2:57 PM   Specimen: BLOOD  Result Value Ref Range Status   Specimen Description BLOOD BLOOD LEFT HAND  Final   Special Requests   Final    BOTTLES DRAWN AEROBIC ONLY Blood Culture adequate volume   Culture   Final    NO GROWTH 2 DAYS Performed at [facility], [place] [address], [place], [place] [address]    Report Status PENDING  Incomplete  Culture, blood (Routine X 2) w Reflex to ID Panel     Status: None (Preliminary result)   Collection Time: 02/18/22  2:57 PM   Specimen: BLOOD  Result Value Ref Range Status   Specimen Description BLOOD BLOOD RIGHT HAND  Final   Special Requests   Final    BOTTLES DRAWN AEROBIC ONLY Blood Culture adequate volume   Culture   Final    NO GROWTH 2 DAYS Performed at [facility], [place] [address] [place], [place] [address]    Report Status PENDING  Incomplete    Radiology Studies: No results found.    Meranda Dechaine T. [place]ra [place]  If 7PM-7AM, please contact night-coverage [email or web address] 02/20/2022, 11:35 AM

## 2022-02-20 NOTE — Progress Notes (Signed)
Patient transferred from [facility] at 1004hrs.  Oriented to room and plan of care for shift.  Patient verbalized understanding.

## 2022-02-21 DIAGNOSIS — R638 Other symptoms and signs concerning food and fluid intake: Secondary | ICD-10-CM

## 2022-02-21 DIAGNOSIS — R7401 Elevation of levels of liver transaminase levels: Secondary | ICD-10-CM

## 2022-02-21 LAB — COMPREHENSIVE METABOLIC PANEL
ALT: 83 U/L — ABNORMAL HIGH (ref 0–44)
AST: 31 U/L (ref 15–41)
Albumin: 2.7 g/dL — ABNORMAL LOW (ref 3.5–5.0)
Alkaline Phosphatase: 44 U/L (ref 38–126)
Anion gap: 8 (ref 5–15)
BUN: 19 mg/dL (ref 6–20)
CO2: 27 mmol/L (ref 22–32)
Calcium: 8.6 mg/dL — ABNORMAL LOW (ref 8.9–10.3)
Chloride: 99 mmol/L (ref 98–111)
Creatinine, Ser: 0.53 mg/dL (ref 0.44–1.00)
GFR, Estimated: >60 mL/min (ref >60)
Glucose, Bld: 119 mg/dL — ABNORMAL HIGH (ref 70–99)
Potassium: 3.9 mmol/L (ref 3.5–5.1)
Sodium: 134 mmol/L — ABNORMAL LOW (ref 135–145)
Total Bilirubin: <0.1 mg/dL — ABNORMAL LOW (ref 0.3–1.2)
Total Protein: 5.4 g/dL — ABNORMAL LOW (ref 6.5–8.1)

## 2022-02-21 LAB — CK: Total CK: 94 U/L (ref 38–234)

## 2022-02-21 LAB — CBC WITH DIFFERENTIAL/PLATELET
Abs Immature Granulocytes: 0.48 10*3/uL — ABNORMAL HIGH (ref 0.00–0.07)
Basophils Absolute: 0 10*3/uL (ref 0.0–0.1)
Basophils Relative: 0 %
Eosinophils Absolute: 0.6 10*3/uL — ABNORMAL HIGH (ref 0.0–0.5)
Eosinophils Relative: 3 %
HCT: 29.3 % — ABNORMAL LOW (ref 36.0–46.0)
Hemoglobin: 9.7 g/dL — ABNORMAL LOW (ref 12.0–15.0)
Immature Granulocytes: 3 %
Lymphocytes Relative: 15 %
Lymphs Abs: 2.6 10*3/uL (ref 0.7–4.0)
MCH: 27.5 pg (ref 26.0–34.0)
MCHC: 33.1 g/dL (ref 30.0–36.0)
MCV: 83 fL (ref 80.0–100.0)
Monocytes Absolute: 1.2 10*3/uL — ABNORMAL HIGH (ref 0.1–1.0)
Monocytes Relative: 7 %
Neutro Abs: 12.4 10*3/uL — ABNORMAL HIGH (ref 1.7–7.7)
Neutrophils Relative %: 72 %
Platelets: 242 10*3/uL (ref 150–400)
RBC: 3.53 MIL/uL — ABNORMAL LOW (ref 3.87–5.11)
RDW: 16.5 % — ABNORMAL HIGH (ref 11.5–15.5)
WBC: 17.2 10*3/uL — ABNORMAL HIGH (ref 4.0–10.5)
nRBC: 0 % (ref 0.0–0.2)

## 2022-02-21 LAB — GLUCOSE, CAPILLARY
Glucose-Capillary: 132 mg/dL — ABNORMAL HIGH (ref 70–99)
Glucose-Capillary: 133 mg/dL — ABNORMAL HIGH (ref 70–99)
Glucose-Capillary: 139 mg/dL — ABNORMAL HIGH (ref 70–99)
Glucose-Capillary: 145 mg/dL — ABNORMAL HIGH (ref 70–99)
Glucose-Capillary: 149 mg/dL — ABNORMAL HIGH (ref 70–99)
Glucose-Capillary: 162 mg/dL — ABNORMAL HIGH (ref 70–99)

## 2022-02-21 LAB — MAGNESIUM: Magnesium: 1.9 mg/dL (ref 1.7–2.4)

## 2022-02-21 LAB — PHOSPHORUS: Phosphorus: 4.9 mg/dL — ABNORMAL HIGH (ref 2.5–4.6)

## 2022-02-21 MED ORDER — HYDRALAZINE HCL 25 MG PO TABS: 25.0000 mg | ORAL_TABLET | Freq: Four times a day (QID) | ORAL | Status: DC | PRN

## 2022-02-21 MED ORDER — ENSURE ENLIVE PO LIQD
237.0000 mL | Freq: Two times a day (BID) | ORAL | Status: DC
  Administered 2022-02-21 – 2022-02-25 (×9): 237 mL via ORAL

## 2022-02-21 MED ORDER — OSMOLITE 1.5 CAL PO LIQD
1000.0000 mL | ORAL | Status: DC
  Administered 2022-02-21 – 2022-02-22 (×2): 1000 mL
  Filled 2022-02-21 (×3): qty 1000

## 2022-02-21 MED ORDER — ADULT MULTIVITAMIN W/MINERALS CH
1.0000 | ORAL_TABLET | Freq: Every day | ORAL | Status: DC
  Administered 2022-02-22 – 2022-02-25 (×4): 1 via ORAL
  Filled 2022-02-21 (×4): qty 1

## 2022-02-21 NOTE — Progress Notes (Signed)
Nutrition Follow-up  DOCUMENTATION CODES:   Not applicable  INTERVENTION:  Transition to nocturnal tube feeds via Cortrak: -Provide Osmolite 1.5 at 65 mL/hour x 12 hours overnight from 1800-0600 (780 mL) -Continue PROSource TF20 60 mL BID -Provides: 1330 kcal, 89  grams of protein, 593 mL H2O daily -Meets 67% minimum kcal needs and 77% minimum protein needs  Continue 1:1 assistance at meals as patient is very weak and has difficulty feeding herself.  Provide Ensure Enlive po BID, each supplement provides 350 kcal and 20 grams of protein.  Provide multivitamin with minerals po daily.  NUTRITION DIAGNOSIS:   Inadequate oral intake related to acute illness, decreased appetite as evidenced by per patient/family report, meal completion < 50%.  Updated nutrition diagnosis.  GOAL:   Patient will meet greater than or equal to 90% of their needs  Previously met with tube feeds. Progressing with advancement of diet and interventions.  MONITOR:   PO intake, Supplement acceptance, Labs, Weight trends, TF tolerance, I & O's  REASON FOR ASSESSMENT:   Consult, Ventilator Enteral/tube feeding initiation and management (ECMO)  ASSESSMENT:   27 yo female admitted with acute respiratory failure due to severe asthma exacerbation requiring intubation with deep sedation, paralyzed and cannulated for VV ECMO. PMH includes asthma, AR, eczema  2/5: admitted, intubated, deep sedation and paralyzed on nimbex gtt 2/6: VV ECMO cannulation at 12AM 2/7: Cortrak (gastric), trickle TF, off nimbex 2/8: VV ECMO decannulation 2/12: tube feeds had advanced to goal rate over the weekend 2/15: extubated, rectal tube removed 2/16: seen by SLP and recommended to be NPO 2/17: diet advanced to full liquids 2/18: diet advanced to regular with thin liquids  Met with pt at bedside. She reports her appetite is "okay" but not back to baseline. She had 50% of breakfast and lunch on 2/18 (full liquids). She had  15% of dinner last night and breakfast this morning (regular diet). Per discussion with RN pt is very weak and needs assistance with setting up tray and eating. They are going to help her with this. [name], but feels full with continuous tube feeds. Plan is to transition to nocturnal tube feeds to allow pt to maximize oral intake during the day, while still providing supplemental nutrition support. Pt is also amenable to drinking Ensure between meals as oral nutrition supplement. When changing to nocturnal tube feeds, will change formula to Osmolite as pt is no longer intubated and Osmolite has slightly lower osmolality.  Admission wt was 71.1 kg on 02/07/22. Wt got up to highest of 78.8 kg on 02/11/22. Suspect may have been related to fluid status. Current wt is 74.2 kg and pt with non-pitting edema so still may be falsely elevated from fluid. Will continue to monitor trends.  Medications reviewed and include: Novolog 0-9 units TID, Novolog 0-5 units QHS, senna-docusate  Labs reviewed: CBG 125-162, Sodium 134, Phosphorus 4.9  Enteral Access: 10 Fr. Cortrak tube placed 2/7; 68 cm in left nare secured with bridle; terminates in distal gastric body per abdominal x-ray 2/7 and per chest x-ray 2/9 feeding tube is unchanged  Tube feeds: Vital 1.5 at 55 mL/hour + PROSource TF20 60 mL BID  UOP: 1730 ml (1 mL/kg/hr)  I/O: -4829.7 mL since admission  Discussed with MD via secure chat. Plan is to transition to nocturnal tube feeds today. Discussed with RN.  Diet Order:   Diet Order             Diet regular  Room service appropriate? Yes with Assist; Fluid consistency: Thin  Diet effective now                  EDUCATION NEEDS:   No education needs have been identified at this time  Skin:  Skin Assessment: Reviewed RN Assessment  Last BM:  02/21/22 - medium type 7  Height:   Ht Readings from Last 1 Encounters:  02/07/22 5' (1.524 m)   Weight:   Wt Readings from Last 1  Encounters:  02/21/22 74.2 kg   BMI:  Body mass index is 31.95 kg/m.  Estimated Nutritional Needs:   Kcal:  2000-2200 kcals  Protein:  115-135 g  Fluid:  >/= 2L  Kaitlyn Iorio Magda Paganini, MS, RD, LDN, CNSC Pager number available on Amion

## 2022-02-21 NOTE — Progress Notes (Signed)
Physical Therapy Treatment Patient Details Name: Kaitlyn Mcneil MRN: [id number] DOB: Aug 07, 1995 Today's Date: 02/21/2022   History of Present Illness Pt is 27 year old presented to [facility] on  02/07/22 with acute hypoxic respiratory failure due to asthma exacerbation secondary to rhinovirus. Intubated 2/5-2/15. VV-ECMO 2/6-2/8. PMH - asthma, eczema    PT Comments    Good progress, eager to work with therapies today. Tolerated transfer training at min A level with posterior LOB, gait training, standing balance, and progressive LE exercises. Min assist +2 for safety with gait showing scissoring type gait, leftward LOB, and difficulty sequencing. 10 feet x2 bouts. VSS throughout, SpO2 99% on RA. No DOE. Would greatly benefit from AIR. Patient will continue to benefit from skilled physical therapy services to further improve independence with functional mobility.    Recommendations for follow up therapy are one component of a multi-disciplinary discharge planning process, led by the attending physician.  Recommendations may be updated based on patient status, additional functional criteria and insurance authorization.  Follow Up Recommendations  Acute inpatient rehab (3hours/day)     Assistance Recommended at Discharge Frequent or constant Supervision/Assistance  Patient can return home with the following Assist for transportation;Assistance with cooking/housework;Two people to help with walking and/or transfers;A lot of help with bathing/dressing/bathroom;Direct supervision/assist for medications management;Direct supervision/assist for financial management   Equipment Recommendations  Other (comment) (To be determined)    Recommendations for Other Services       Precautions / Restrictions Precautions Precautions: Fall Restrictions Weight Bearing Restrictions: No     Mobility  Bed Mobility Overal bed mobility: Needs Assistance Bed Mobility: Supine to Sit     Supine to sit: Min  guard     General bed mobility comments: Min guard for safety, required extra time, delayed processing to sequence out of bed but no physical assist needed.    Transfers Overall transfer level: Needs assistance Equipment used: Rolling walker (2 wheels) Transfers: Sit to/from Stand Sit to Stand: Min assist           General transfer comment: Min assist for boost to stand, practiced 3x from low bed setting. Cues for hand placement, and technique. Mild posterior LOB.    Ambulation/Gait Ambulation/Gait assistance: Min assist, +2 safety/equipment Gait Distance (Feet): 10 Feet (x2) Assistive device: Rolling walker (2 wheels) Gait Pattern/deviations: Step-through pattern, Decreased stride length, Narrow base of support, Scissoring Gait velocity: decr Gait velocity interpretation: <1.31 ft/sec, indicative of household ambulator Pre-gait activities: Standing weigth shift lateral, a/p. Static march, BIL hands supported. General Gait Details: Educated on safe AD use with RW. Min assist +2 for walker placement/control, VC for sequencing and spacing between feet due to intermittent scissoring. LOB towards left gradually improved. Tactile cues for backing up towards chair, pt attempting to sit prematurely despites several multimodal cues.   Stairs             Wheelchair Mobility    Modified Rankin (Stroke Patients Only)       Balance Overall balance assessment: Needs assistance Sitting-balance support: Feet supported Sitting balance-Leahy Scale: Fair   Postural control: Posterior lean, Left lateral lean Standing balance support: Reliant on assistive device for balance Standing balance-Leahy Scale: Poor                              Cognition Arousal/Alertness: Awake/alert Behavior During Therapy: WFL for tasks assessed/performed Overall Cognitive Status: Impaired/Different from baseline Area of Impairment: Problem solving, Following commands  Following Commands: Follows one step commands with increased time, Follows one step commands inconsistently     Problem Solving: Decreased initiation, Slow processing, Difficulty sequencing, Requires verbal cues, Requires tactile cues General Comments: Cues to facilitate moderately challenging tasks such as stepping back (tries to sit prematurely). When told to sit in the recliner, she turned towards the bed to sit down.        Exercises General Exercises - Lower Extremity Ankle Circles/Pumps: Both, AROM, 15 reps, Seated Quad Sets: Strengthening, Both, 10 reps, Seated Gluteal Sets: Strengthening, Both, 10 reps, Seated Long Arc Quad: Strengthening, Both, 10 reps, Seated Hip ABduction/ADduction: Strengthening, Both, 10 reps, Seated Hip Flexion/Marching: Strengthening, Both, 10 reps, Seated    General Comments General comments (skin integrity, edema, etc.): Mother present, supportive during session. VSS throughout session. SpO2 99% on RA.      Pertinent Vitals/Pain Pain Assessment Pain Assessment: No/denies pain Pain Intervention(s): Monitored during session    Home Living                          Prior Function            PT Goals (current goals can now be found in the care plan section) Acute Rehab PT Goals Patient Stated Goal: Pt did not state PT Goal Formulation: Patient unable to participate in goal setting Time For Goal Achievement: 03/04/22 Potential to Achieve Goals: Good Progress towards PT goals: Progressing toward goals    Frequency    Min 3X/week      PT Plan Current plan remains appropriate    Co-evaluation              AM-PAC PT "6 Clicks" Mobility   Outcome Measure  Help needed turning from your back to your side while in a flat bed without using bedrails?: A Little Help needed moving from lying on your back to sitting on the side of a flat bed without using bedrails?: A Little Help needed moving to and from a bed to  a chair (including a wheelchair)?: A Lot Help needed standing up from a chair using your arms (e.g., wheelchair or bedside chair)?: A Lot Help needed to walk in hospital room?: Total Help needed climbing 3-5 steps with a railing? : Total 6 Click Score: 12    End of Session Equipment Utilized During Treatment: Gait belt Activity Tolerance: Patient tolerated treatment well Patient left: with call bell/phone within reach;in chair;with chair alarm set;with family/visitor present Nurse Communication: Mobility status PT Visit Diagnosis: Other abnormalities of gait and mobility (R26.89);Muscle weakness (generalized) (M62.81)     Time: [id number] PT Time Calculation (min) (ACUTE ONLY): 25 min  Charges:  $Gait Training: 8-22 mins $Therapeutic Exercise: 8-22 mins                     Candie Mile, PT, DPT Physical Therapist Acute Rehabilitation Services [place]    Ellouise Newer 02/21/2022, 4:23 PM

## 2022-02-21 NOTE — Progress Notes (Signed)
   02/21/22 1136  What Happened  Was fall witnessed? Yes  Who witnessed fall? Brittney Ward, student nurse  Patients activity before fall ambulating-assisted  Point of contact hip/leg (right hip)  Was patient injured? No  Provider Notification  Provider Name/Title Dr. Cyndia Skeeters  Date Provider Notified 02/21/22  Time Provider Notified 1136  Method of Notification Page  Notification Reason Fall  Provider response See new orders (orders for fall precautions placed)  Date of Provider Response 02/21/22  Time of Provider Response 1137  Follow Up  Family notified No - patient refusal  Additional tests No  Progress note created (see row info) Yes  Adult Fall Risk Assessment  Risk Factor Category (scoring not indicated) Fall has occurred during this admission (document High fall risk)  Patient Fall Risk Level High fall risk  Adult Fall Risk Interventions  Required Bundle Interventions *See Row Information* High fall risk - low, moderate, and high requirements implemented  Additional Interventions PT/OT need assessed if change in mobility from baseline;Use of appropriate toileting equipment (bedpan, BSC, etc.)  Screening for Fall Injury Risk (To be completed on HIGH fall risk patients) - Assessing Need for Floor Mats  Risk For Fall Injury- Criteria for Floor Mats Previous fall this admission  Will Implement Floor Mats Yes  Vitals  Temp 98.4 F (36.9 C)  Temp Source Oral  BP 130/76  MAP (mmHg) 87  BP Location Right Arm  BP Method Automatic  Patient Position (if appropriate) Lying  Pulse Rate 94  Pulse Rate Source Monitor  ECG Heart Rate 94  Cardiac Rhythm NSR  Resp 18  Oxygen Therapy  SpO2 97 %  O2 Device Room Air  Pain Assessment  Pain Scale 0-10  Pain Score 0  Patients Stated Pain Goal 0  Neurological  Neuro (WDL) WDL  Level of Consciousness Alert  Glasgow Coma Scale  Eye Opening 4  Best Verbal Response (NON-intubated) 5  Best Motor Response 6  Glasgow Coma Scale Score  15  Musculoskeletal  Musculoskeletal (WDL) X  Assistive Device Front wheel walker;Stedy  Generalized Weakness Yes  Weight Bearing Restrictions No  Integumentary  Integumentary (WDL) WDL

## 2022-02-21 NOTE — Progress Notes (Signed)
Occupational Therapy Treatment Patient Details Name: Kaitlyn Mcneil MRN: [id number] DOB: Sep 12, 1995 Today's Date: 02/21/2022   History of present illness Pt is 27 year old presented to [facility] on  02/07/22 with acute hypoxic respiratory failure due to asthma exacerbation secondary to rhinovirus. Intubated 2/5-2/15. VV-ECMO 2/6-2/8. PMH - asthma, eczema   OT comments  Patient with good progress toward patient focused goals.  Patient much more alert and talkative, able to progress toward sitting edge of bed for light grooming, and able to sit to stand times two with up to Mod A.  OT is indicated in the acute setting to address deficits listed, and assist with transition to AIR for post acute rehab.     Recommendations for follow up therapy are one component of a multi-disciplinary discharge planning process, led by the attending physician.  Recommendations may be updated based on patient status, additional functional criteria and insurance authorization.    Follow Up Recommendations  Acute inpatient rehab (3hours/day)     Assistance Recommended at Discharge Frequent or constant Supervision/Assistance  Patient can return home with the following  A lot of help with walking and/or transfers;A lot of help with bathing/dressing/bathroom;Assist for transportation;Assistance with cooking/housework;Assistance with feeding   Equipment Recommendations  None recommended by OT    Recommendations for Other Services      Precautions / Restrictions Precautions Precautions: Fall Restrictions Weight Bearing Restrictions: No       Mobility Bed Mobility Overal bed mobility: Needs Assistance Bed Mobility: Supine to Sit, Sit to Supine     Supine to sit: Min guard Sit to supine: Min assist        Transfers Overall transfer level: Needs assistance   Transfers: Sit to/from Stand Sit to Stand: Min assist, Mod assist                 Balance Overall balance assessment: Needs  assistance Sitting-balance support: Feet supported Sitting balance-Leahy Scale: Fair     Standing balance support: Reliant on assistive device for balance Standing balance-Leahy Scale: Poor                             ADL either performed or assessed with clinical judgement   ADL Overall ADL's : Needs assistance/impaired Eating/Feeding: Minimal assistance;Bed level   Grooming: Wash/dry hands;Wash/dry face;Oral care;Min guard;Sitting   Upper Body Bathing: Moderate assistance;Sitting   Lower Body Bathing: Maximal assistance;Bed level   Upper Body Dressing : Moderate assistance;Sitting   Lower Body Dressing: Maximal assistance;Bed level   Toilet Transfer: Moderate assistance;Stand-pivot;BSC/3in1                  Extremity/Trunk Assessment Upper Extremity Assessment Upper Extremity Assessment: Generalized weakness   Lower Extremity Assessment Lower Extremity Assessment: Defer to PT evaluation   Cervical / Trunk Assessment Cervical / Trunk Assessment: Normal    Vision Baseline Vision/History: 0 No visual deficits     Perception Perception Perception: Within Functional Limits   Praxis Praxis Praxis: Intact    Cognition Arousal/Alertness: Awake/alert Behavior During Therapy: WFL for tasks assessed/performed Overall Cognitive Status: Within Functional Limits for tasks assessed  Pertinent Vitals/ Pain       Pain Assessment Pain Assessment: No/denies pain Pain Intervention(s): Monitored during session                                                          Frequency  Min 2X/week        Progress Toward Goals  OT Goals(current goals can now be found in the care plan section)  Progress towards OT goals: Progressing toward goals  Acute Rehab OT Goals OT Goal Formulation: With patient Time For Goal Achievement: 03/04/22 Potential  to Achieve Goals: Good ADL Goals Pt Will Perform Eating: with set-up;sitting Pt Will Perform Grooming: with set-up;sitting Pt Will Perform Upper Body Dressing: with set-up;sitting Pt Will Perform Lower Body Dressing: sit to/from stand;with min assist Pt Will Transfer to Toilet: with supervision;stand pivot transfer;bedside commode Pt/caregiver will Perform Home Exercise Program: Increased strength;Both right and left upper extremity;With Supervision  Plan Discharge plan remains appropriate    Co-evaluation                 AM-PAC OT "6 Clicks" Daily Activity     Outcome Measure   Help from another person eating meals?: A Little Help from another person taking care of personal grooming?: A Little Help from another person toileting, which includes using toliet, bedpan, or urinal?: A Lot Help from another person bathing (including washing, rinsing, drying)?: A Lot Help from another person to put on and taking off regular upper body clothing?: A Lot Help from another person to put on and taking off regular lower body clothing?: A Lot 6 Click Score: 14    End of Session Equipment Utilized During Treatment: Gait belt;Rolling walker (2 wheels)  OT Visit Diagnosis: Muscle weakness (generalized) (M62.81);Other symptoms and signs involving cognitive function   Activity Tolerance Patient limited by fatigue   Patient Left in bed;with call bell/phone within reach;with bed alarm set   Nurse Communication Mobility status        Time: [id number] OT Time Calculation (min): 24 min  Charges: OT General Charges $OT Visit: 1 Visit OT Treatments $Self Care/Home Management : 23-37 mins  02/21/2022  RP, OTR/L  Acute Rehabilitation Services  Office:  [phone]   Kaitlyn Mcneil 02/21/2022, 9:39 AM

## 2022-02-21 NOTE — Progress Notes (Signed)
PROGRESS NOTE  Kaitlyn Mcneil DOB: 23-Dec-1995   PCP: Pcp, No  Patient is from: Home  DOA: 02/07/2022 LOS: 65  Chief complaints Chief Complaint  Patient presents with   Shortness of Breath     Brief Narrative / Interim history: 27 yo F with PMH of asthma, allergic rhinitis, obesity and eczema who presented 2/5 with acute dyspnea that did not improve with home nebulizers and rescue inhalers.  She had a fever, cough and URI symptoms.  She had sick contacts.   In the ED, she was started on BiPAP, steroid and nebulizers.  However, she continued to have remarkably increased work of breathing and fatigue for which she was intubated.  She required a great deal of sedation and neuromuscular blockade to achieve adequate minute ventilation.  She was placed on ECMO for 48 hours on 2/6 and decannulated 2/8.  Continued an asthma treatment course on ventilator and began weaning off sedation.  She required Precedex and clonidine for agitation.  Eventually, successfully extubated on 2/15 but remained remains delirious with agitation and tachycardia.  Eventually, she came off Precedex, and transferred to [facility] hospitalist service on 2/18.  Patient is currently on room air.  Delirium seems to have resolved.  She was on full liquid diet as well as tube feed by cortrack.  Diet advanced to regular by SLP.  Tube feed changed to nocturnal tube improve appetite.   Subjective: Seen and examined earlier this morning.  No major events overnight of this morning.  No complaints.  Feels better.  She denies pain or shortness of breath.  She only ate about 15% of her breakfast this morning.  Objective: Vitals:   02/21/22 0748 02/21/22 0821 02/21/22 1136 02/21/22 1210  BP: (!) 120/57 117/85 130/76 130/76  Pulse: 87  94   Resp: 17  18   Temp: 98.6 F (37 C)  98.4 F (36.9 C)   TempSrc: Oral  Oral   SpO2: 98%  97%   Weight:      Height:        Examination:  GENERAL: No apparent distress.   Nontoxic.  Sitting on the edge of the bed eating breakfast. HEENT: MMM.  Vision and hearing grossly intact.  Cortrack in place. NECK: Supple.  No apparent JVD.  RESP:  No IWOB.  Fair aeration bilaterally. CVS:  RRR. Heart sounds normal.  ABD/GI/GU: BS+. Abd soft, NTND.  Foley catheter in place. MSK/EXT:   No apparent deformity. Moves extremities. No edema.  SKIN: no apparent skin lesion or wound NEURO: Awake and alert. Oriented x 4.  No apparent focal neuro deficit. PSYCH: Calm. Normal affect.  Procedures:  Intubation from 2/5-2/15 ECMO from 2/6-2/8  Microbiology summarized: COVID-19, influenza and RSV PCR nonreactive Full RVP panel positive for rhinovirus Blood cultures NGTD on 2/-2/15 Tracheal aspirate culture negative on 2/7 Urine culture negative on 2/15  Assessment and plan: Principal Problem:   Acute respiratory distress Active Problems:   Severe persistent asthma with status asthmaticus   Acute respiratory failure with hypoxia and hypercapnia (HCC)   Acute metabolic encephalopathy   Severe sepsis (HCC)   Sinus tachycardia   Delirium   Generalized weakness   Obesity (BMI 30-39.9)   Lactic acidosis  Severe sepsis due to rhinovirus infection: POA.  Had tachycardia, tachypnea, respiratory failure with lactic acidosis on presentation.  Sepsis physiology resolving.   She was on meropenem 2/6-2/9 and vancomycin 2/6-2/8. Extensive infectious workup as above.  -Continues monitoring leukocytosis-improving. -Management as below  Acute respiratory failure with hypoxia and hypercapnia due to status asthmaticus, severe persistent allergic asthma.  RVP positive for rhinovirus.  Improved.  Respiratory failure seems to have resolved. -Intubation and mechanical ventilation from 2/5-2/15 -ECMO from 2/6-2/8 -Completed steroid course -Continue Dulera and Incruse.  Xopenex as needed. -Continue antihistamine and Singulair; needs both prescribed at discharge -Needs inhaler education.   Will need assistance getting inhalers from [facility] after discharge. -Continue pulmonary hygiene -Pulmonology arranging outpatient follow-up. -Has previously been taken off PPI; if she develops GERD symptoms she will need PPI.  Elevated ALT: Mild.  Multifactorial -Check CK in the morning -Continue monitoring -Consider workup if warts  Anemia of critical illness: Stable. Recent Labs    02/15/22 0405 02/16/22 0337 02/16/22 0338 02/16/22 1728 02/17/22 0350 02/17/22 0351 02/18/22 0256 02/19/22 0053 02/20/22 0031 02/21/22 0250  HGB 9.5* 10.2* 9.5* 10.5* 9.2* 9.9* 10.8* 9.7* 10.6* 9.7*  -Continue monitoring -Check anemia panel in the morning  Physical deconditioning: Likely due to acute illness. -OOB, PT/OT-recommended CIR. -Fall precaution.   Oropharyngeal dysphagia: Likely due to prolonged illness and intubation.  Seems to have resolved. -Advance to regular diet by SLP.   Acute metabolic encephalopathy-multifactorial due to sedating medications, high-dose steroids.  Resolved.  She is currently oriented x 4. -Weaned off oxycodone and Klonopin -Wean off clonidine today   Sinus tachycardia: Resolved. -Wean of clonidine today -Continue Cardizem-will start weaning if possible   Hyperglycemia: Likely steroid induced.  Hgb 5.9%. -Continue CBG monitoring and SSI.  Changed to ACHS.  Leukocytosis-no obvious source: Demargination?  Seems like she has been off steroid for quite some time now.  Extensive infectious workup unrevealing.  Resolving. -Continue monitoring  Indwelling Foley catheter -Voiding trial today  Obesity/inadequate oral intake Body mass index is 31.95 kg/m. Nutrition Problem: Inadequate oral intake Etiology: acute illness, decreased appetite Signs/Symptoms: per patient/family report, meal completion < 50% Interventions: Refer to RD note for recommendations -Advance to regular diet by SLP but poor p.o. intake remains poor -Agree with  decreasing tube feeds to nocturnal to improve appetite.   DVT prophylaxis:  enoxaparin (LOVENOX) injection 40 mg Start: 02/12/22 1000 SCD's Start: 02/08/22 0300  Code Status: Full code Family Communication: None at bedside Level of care: Progressive Status is: Inpatient Remains inpatient appropriate because: Severe asthma exacerbation, physical deconditioning   Final disposition: CIR? Consultants:  Pulmonology admitted patient  55 minutes with more than 50% spent in reviewing records, counseling patient/family and coordinating care.   Sch Meds:  Scheduled Meds:  diltiazem  90 mg Oral Q6H   enoxaparin (LOVENOX) injection  40 mg Subcutaneous Daily   feeding supplement  237 mL Oral BID BM   feeding supplement (OSMOLITE 1.5 CAL)  1,000 mL Per Tube Q24H   feeding supplement (PROSource TF20)  60 mL Per Tube BID   insulin aspart  0-5 Units Subcutaneous QHS   insulin aspart  0-9 Units Subcutaneous TID WC   loratadine  10 mg Oral Daily   mometasone-formoterol  2 puff Inhalation BID   montelukast  10 mg Oral QHS   [START ON 02/22/2022] multivitamin with minerals  1 tablet Oral Daily   mouth rinse  15 mL Mouth Rinse 4 times per day   senna-docusate  1 tablet Oral Daily   umeclidinium bromide  1 puff Inhalation Daily   Continuous Infusions:  sodium chloride 500 mL (02/15/22 0246)   sodium chloride Stopped (02/17/22 1900)   PRN Meds:.sodium chloride, hydrALAZINE, levalbuterol, ondansetron (ZOFRAN) IV, polyethylene glycol  Antimicrobials: Anti-infectives (From admission, onward)    Start     Dose/Rate Route Frequency Ordered Stop   02/08/22 2100  vancomycin (VANCOCIN) IVPB 1000 mg/200 mL premix  Status:  Discontinued        1,000 mg 200 mL/hr over 60 Minutes Intravenous Every 12 hours 02/08/22 1333 02/10/22 0750   02/08/22 0830  meropenem (MERREM) 1 g in sodium chloride 0.9 % 100 mL IVPB  Status:  Discontinued        1 g 200 mL/hr over 30 Minutes Intravenous Every 8 hours  02/08/22 0742 02/11/22 0819   02/08/22 0830  vancomycin (VANCOCIN) 2,000 mg in sodium chloride 0.9 % 500 mL IVPB        2,000 mg 260 mL/hr over 120 Minutes Intravenous  Once 02/08/22 0742 02/08/22 1052        I have personally reviewed the following labs and images: CBC: Recent Labs  Lab 02/17/22 0350 02/17/22 0351 02/18/22 0256 02/19/22 0053 02/20/22 0031 02/21/22 0250  WBC 25.0*  --  38.1* 23.4* 23.3* 17.2*  NEUTROABS  --   --   --   --   --  12.4*  HGB 9.2* 9.9* 10.8* 9.7* 10.6* 9.7*  HCT 28.8* 29.0* 31.7* 29.4* 32.3* 29.3*  MCV 84.5  --  82.6 83.8 83.5 83.0  PLT 225  --  269 255 267 242   BMP &GFR Recent Labs  Lab 02/15/22 0342 02/15/22 0405 02/16/22 0338 02/16/22 1728 02/17/22 0350 02/17/22 0351 02/21/22 0250  NA 142   < > 137 140 140 139 134*  K 3.7   < > 3.8 4.0 3.7 3.9 3.9  CL 103  --  101  --  104  --  99  CO2 31  --  29  --  27  --  27  GLUCOSE 170*  --  149*  --  130*  --  119*  BUN 22*  --  16  --  19  --  19  CREATININE 0.57  --  0.63  --  0.62  --  0.53  CALCIUM 8.7*  --  8.5*  --  8.7*  --  8.6*  MG  --   --   --   --  2.2  --  1.9  PHOS  --   --   --   --   --   --  4.9*   < > = values in this interval not displayed.   Estimated Creatinine Clearance: 96.7 mL/min (by C-G formula based on SCr of 0.53 mg/dL). Liver & Pancreas: Recent Labs  Lab 02/18/22 1454 02/21/22 0250  AST 41 31  ALT 124* 83*  ALKPHOS 48 44  BILITOT 0.3 <0.1*  PROT 5.7* 5.4*  ALBUMIN 2.8* 2.7*   No results for input(s): "LIPASE", "AMYLASE" in the last 168 hours. No results for input(s): "AMMONIA" in the last 168 hours. Diabetic: No results for input(s): "HGBA1C" in the last 72 hours. Recent Labs  Lab 02/20/22 2007 02/21/22 0032 02/21/22 0444 02/21/22 0759 02/21/22 1202  GLUCAP 125* 149* 162* 139* 132*   Cardiac Enzymes: Recent Labs  Lab 02/21/22 0250  CKTOTAL 94   No results for input(s): "PROBNP" in the last 8760 hours. Coagulation Profile: No results  for input(s): "INR", "PROTIME" in the last 168 hours. Thyroid Function Tests: No results for input(s): "TSH", "T4TOTAL", "FREET4", "T3FREE", "THYROIDAB" in the last 72 hours. Lipid Profile: No results for input(s): "CHOL", "HDL", "LDLCALC", "TRIG", "CHOLHDL", "LDLDIRECT" in the last 72  hours. Anemia Panel: No results for input(s): "VITAMINB12", "FOLATE", "FERRITIN", "TIBC", "IRON", "RETICCTPCT" in the last 72 hours. Urine analysis:    Component Value Date/Time   COLORURINE YELLOW 02/17/2022 1205   APPEARANCEUR TURBID (A) 02/17/2022 1205   LABSPEC 1.015 02/17/2022 1205   PHURINE 7.0 02/17/2022 1205   GLUCOSEU NEGATIVE 02/17/2022 1205   HGBUR LARGE (A) 02/17/2022 1205   BILIRUBINUR NEGATIVE 02/17/2022 1205   KETONESUR NEGATIVE 02/17/2022 1205   PROTEINUR NEGATIVE 02/17/2022 1205   NITRITE NEGATIVE 02/17/2022 1205   LEUKOCYTESUR NEGATIVE 02/17/2022 1205   Sepsis Labs: Invalid input(s): "PROCALCITONIN", "LACTICIDVEN"  Microbiology: Recent Results (from the past 240 hour(s))  Culture, Urine (Do not remove urinary catheter, catheter placed by urology or difficult to place)     Status: None   Collection Time: 02/17/22 12:05 PM   Specimen: Urine, Catheterized  Result Value Ref Range Status   Specimen Description URINE, CATHETERIZED  Final   Special Requests NONE  Final   Culture   Final    NO GROWTH Performed at [facility], [address] [place], [place] [address]    Report Status 02/18/2022 FINAL  Final  Culture, blood (Routine X 2) w Reflex to ID Panel     Status: None (Preliminary result)   Collection Time: 02/18/22  2:57 PM   Specimen: BLOOD  Result Value Ref Range Status   Specimen Description BLOOD BLOOD LEFT HAND  Final   Special Requests   Final    BOTTLES DRAWN AEROBIC ONLY Blood Culture adequate volume   Culture   Final    NO GROWTH 3 DAYS Performed at [facility], [place] [address], [place], [place] [address]    Report Status PENDING  Incomplete   Culture, blood (Routine X 2) w Reflex to ID Panel     Status: None (Preliminary result)   Collection Time: 02/18/22  2:57 PM   Specimen: BLOOD  Result Value Ref Range Status   Specimen Description BLOOD BLOOD RIGHT HAND  Final   Special Requests   Final    BOTTLES DRAWN AEROBIC ONLY Blood Culture adequate volume   Culture   Final    NO GROWTH 3 DAYS Performed at [facility], [place] [address] [place], [place] [address]    Report Status PENDING  Incomplete    Radiology Studies: No results found.    Jossette Zirbel T. Corona  If 7PM-7AM, please contact night-coverage [email or web address] 02/21/2022, 1:42 PM

## 2022-02-21 NOTE — Progress Notes (Signed)
NAME:  Kaitlyn Mcneil, MRN:  [id number], DOB:  1995-10-31, LOS: 45 ADMISSION DATE:  02/07/2022, CONSULTATION DATE:  02/07/2022 REFERRING MD:  Zenia Resides, CHIEF COMPLAINT:  Dyspnea  History of Present Illness:  27 yo F with pertinent PMH of asthma, AR, and eczema who presented 2/5 with acute dyspnea. Tried home nebulizers/rescue inhalers without relief.  Had been exposed to others with URIs and has had fever, cough. Reportedly hospitalized in 12/2021 for asthma exacerbation.   In the ED, she was given continuous nebulizer, steroids, IV magnesium and placed on BiPAP.  She continued to have remarkably increased work of breathing, fatigue and decision was made with Dr. Zenia Resides to intubate.  Required a great deal of sedation and neuromuscular blockade to achieve adequate minute ventilation.   Was placed on ECMO for 48 hours on 2/6 and decannulated 2/8.  Continued an asthma treatment course on ventilator and began weaning off sedation 2/11, however experienced repeat episodes of tachycardia and hypertension when weaning and required addition of Precedex and clonidine.  Slowly progressing through sedation wean with goal of extubation, further complicated by episode of significant tracheal secretions production and coughing requiring resedation.  Successfully extubated 2/15, remains delirious and agitated with tachycardia.  Pertinent  Medical History  - Asthma - AR - Eczema  Significant Hospital Events: Including procedures, antibiotic start and stop dates in addition to other pertinent events   2/5 - Intubated with NMB, deep sedation, Nimbex 2/6 - [facility] 7F VV-ECMO 2/8 - Decannulated  2/11 - Weaning off sedation but having episodes of hypertension and tachycardia 2/13 - Continuing to wean off sedation with hypertension, starting clonidine and Precedex 2/14 - Finishing sedation wean and planning to extubate when following instructions; increased frothy secretions from trachea, acutely hypertensive and  tachycardic; resedated. 2/15 - Bronchoscopy performed, minimal mucus secretions noted.  Continuing to wean sedation and pulling Foley along with arterial line given leukocytosis. 2/16 - Still somewhat delirious. WBC still trending up, blood Cx and LFTs ordered. 2/17 Little sleep reported overnight but patient is less confused this am.   Interim History / Subjective:  Feels improved today.  Still feels a bit weak.  Objective   Blood pressure 130/76, pulse 94, temperature 98.4 F (36.9 C), temperature source Oral, resp. rate 18, height 5' (1.524 m), weight 74.2 kg, SpO2 97 %.        Intake/Output Summary (Last 24 hours) at 02/21/2022 1509 Last data filed at 02/21/2022 0800 Gross per 24 hour  Intake 954.92 ml  Output 1450 ml  Net -495.08 ml    Filed Weights   02/17/22 0500 02/18/22 0425 02/21/22 0441  Weight: 75.6 kg 74.2 kg 74.2 kg   Examination: General: Acute deconditioned sitting up in bed in NAD  HEENT: [place]/AT, MM pink/moist, PERRL,  Neuro: Alert and oriented x3, slightly confused to situation  CV: s1s2 regular rate and rhythm, no murmur, rubs, or gallops,  PULM:  Clear to auscultation, no increased work of breathing, no added breath sounds GI: soft, bowel sounds active in all 4 quadrants, non-tender, non-distended, tolerating TF Extremities: warm/dry, no edema  Skin: no rashes or lesions  Resolved Hospital Problems  - Hypernatremia - Asthma exacerbation 2/2 rhinovirus  Assessment & Plan:  Acute hypoxic and hypercapnic respiratory failure in setting of acute asthma exacerbation 2/2 rhinovirus -Asthma exacerbation resolved.  On room air Plan Continue inhaled triple therapy ICS/LAMA/LAMA, will need prescriptions for these at discharge, I agree with exploring all options to ensure she has medications at reasonable cost  at discharge, please contact us if we can help in any way moving forward Mobilize as able  Encourage pulmonary hygiene  Will send message to arrange  pulmonary follow-up in the next few weeks  PCCM will sign off  Best Practice (right click and "Reselect all SmartList Selections" daily)   Per primary  Elmore Kaelen Caughlin, MD [facility] Personal contact information can be found on Amion  If no contact or response made please call [phone] 02/21/2022, 3:09 PM

## 2022-02-21 NOTE — Progress Notes (Signed)
Inpatient Rehab Admissions Coordinator:   At this time we are recommending a CIR consult and I will place an order per our protocol.   Shann Medal, PT, DPT Admissions Coordinator [phone] 02/21/22  2:30 PM

## 2022-02-22 ENCOUNTER — Encounter: Payer: Self-pay | Admitting: Pulmonary Disease

## 2022-02-22 LAB — FERRITIN: Ferritin: 40 ng/mL (ref 11–307)

## 2022-02-22 LAB — RETICULOCYTES
Immature Retic Fract: 22.6 % — ABNORMAL HIGH (ref 2.3–15.9)
RBC.: 3.56 MIL/uL — ABNORMAL LOW (ref 3.87–5.11)
Retic Count, Absolute: 59.1 10*3/uL (ref 19.0–186.0)
Retic Ct Pct: 1.7 % (ref 0.4–3.1)

## 2022-02-22 LAB — IRON AND TIBC
Iron: 38 ug/dL (ref 28–170)
Saturation Ratios: 11 % (ref 10.4–31.8)
TIBC: 354 ug/dL (ref 250–450)
UIBC: 316 ug/dL

## 2022-02-22 LAB — COMPREHENSIVE METABOLIC PANEL
ALT: 94 U/L — ABNORMAL HIGH (ref 0–44)
AST: 31 U/L (ref 15–41)
Albumin: 2.9 g/dL — ABNORMAL LOW (ref 3.5–5.0)
Alkaline Phosphatase: 51 U/L (ref 38–126)
Anion gap: 9 (ref 5–15)
BUN: 13 mg/dL (ref 6–20)
CO2: 26 mmol/L (ref 22–32)
Calcium: 8.9 mg/dL (ref 8.9–10.3)
Chloride: 101 mmol/L (ref 98–111)
Creatinine, Ser: 0.51 mg/dL (ref 0.44–1.00)
GFR, Estimated: >60 mL/min (ref >60)
Glucose, Bld: 161 mg/dL — ABNORMAL HIGH (ref 70–99)
Potassium: 4 mmol/L (ref 3.5–5.1)
Sodium: 136 mmol/L (ref 135–145)
Total Bilirubin: 0.3 mg/dL (ref 0.3–1.2)
Total Protein: 6 g/dL — ABNORMAL LOW (ref 6.5–8.1)

## 2022-02-22 LAB — GLUCOSE, CAPILLARY
Glucose-Capillary: 126 mg/dL — ABNORMAL HIGH (ref 70–99)
Glucose-Capillary: 133 mg/dL — ABNORMAL HIGH (ref 70–99)
Glucose-Capillary: 137 mg/dL — ABNORMAL HIGH (ref 70–99)
Glucose-Capillary: 150 mg/dL — ABNORMAL HIGH (ref 70–99)
Glucose-Capillary: 151 mg/dL — ABNORMAL HIGH (ref 70–99)
Glucose-Capillary: 156 mg/dL — ABNORMAL HIGH (ref 70–99)

## 2022-02-22 LAB — CK: Total CK: 89 U/L (ref 38–234)

## 2022-02-22 LAB — FOLATE: Folate: 12.1 ng/mL (ref >5.9)

## 2022-02-22 LAB — VITAMIN B12: Vitamin B-12: 1157 pg/mL — ABNORMAL HIGH (ref 180–914)

## 2022-02-22 LAB — MAGNESIUM: Magnesium: 1.8 mg/dL (ref 1.7–2.4)

## 2022-02-22 LAB — PHOSPHORUS: Phosphorus: 4.6 mg/dL (ref 2.5–4.6)

## 2022-02-22 MED ORDER — DILTIAZEM HCL 60 MG PO TABS
60.0000 mg | ORAL_TABLET | Freq: Four times a day (QID) | ORAL | Status: DC
  Administered 2022-02-22 – 2022-02-25 (×12): 60 mg via ORAL
  Filled 2022-02-22 (×12): qty 1

## 2022-02-22 NOTE — Progress Notes (Signed)
Physical Therapy Treatment Patient Details Name: Kaitlyn Mcneil MRN: [id number] DOB: 04/29/1995 Today's Date: 02/22/2022   History of Present Illness Pt is 27 year old presented to [facility] on  [facility]/07/22 with acute hypoxic respiratory failure due to asthma exacerbation secondary to rhinovirus. Intubated 2/5-2/15. VV-ECMO 2/6-2/8. PMH - asthma, eczema    PT Comments    Patient progressing well towards PT goals. Session focused on functional strengthening and progressive ambulation. Improved ambulation distance with Min guard assist and use of RW for support. Requires seated rest break due to fatigue and weakness. Improved gait mechanics with no scissoring today or knee buckling. Performed 5xSTS in 19.5 sec (normative for her age is 6.0 sec) indicating decreased functional strength, impaired balance and fall risk. Encouraged walking to bathroom with nursing as much as tolerated and working with mobility tech as well. Pt may progress well enough to return home pending improvement. Will follow.   Recommendations for follow up therapy are one component of a multi-disciplinary discharge planning process, led by the attending physician.  Recommendations may be updated based on patient status, additional functional criteria and insurance authorization.  Follow Up Recommendations  Acute inpatient rehab (3hours/day)     Assistance Recommended at Discharge Frequent or constant Supervision/Assistance  Patient can return home with the following Assist for transportation;Assistance with cooking/housework;Direct supervision/assist for medications management;Direct supervision/assist for financial management;A little help with walking and/or transfers;A little help with bathing/dressing/bathroom   Equipment Recommendations  Other (comment) (TBA)    Recommendations for Other Services       Precautions / Restrictions Precautions Precautions: Fall Restrictions Weight Bearing Restrictions: No      Mobility  Bed Mobility               General bed mobility comments: up in chair upon PT arrival.    Transfers Overall transfer level: Needs assistance Equipment used: Rolling walker (2 wheels) Transfers: Sit to/from Stand Sit to Stand: Min guard           General transfer comment: Min guard for safety. Cues for hand placement/technique. Stood from Research scientist (life sciences). Able to eventually stand without UE support.    Ambulation/Gait Ambulation/Gait assistance: Min guard Gait Distance (Feet): 95 Feet (x2 bouts) Assistive device: Rolling walker (2 wheels) Gait Pattern/deviations: Step-through pattern, Decreased stride length, Narrow base of support   Gait velocity interpretation: <1.8 ft/sec, indicate of risk for recurrent falls   General Gait Details: Slow, steady gait with narrow BoS but no scissoring. 1 seated rest break due to fatigue.   Stairs             Wheelchair Mobility    Modified Rankin (Stroke Patients Only)       Balance Overall balance assessment: Needs assistance Sitting-balance support: Feet supported, No upper extremity supported Sitting balance-Leahy Scale: Good     Standing balance support: During functional activity Standing balance-Leahy Scale: Fair Standing balance comment: Able to stand statically without UE support, needs UE support for walking                            Cognition Arousal/Alertness: Awake/alert Behavior During Therapy: WFL for tasks assessed/performed Overall Cognitive Status: Impaired/Different from baseline Area of Impairment: Attention, Following commands, Memory                   Current Attention Level: Selective Memory: Decreased short-term memory Following Commands: Follows one step commands consistently, Follows multi-step commands inconsistently  General Comments: Reports she is thinking more clearly today. Needs repetition at times for multi step commands. A&Ox4. vaguely  remembers parts of hospital stay.        Exercises Other Exercises Other Exercises: Sit to stand 2x5 using UEs initially progressing to no use of UEs to stand. Cues for eccentric and slow descent.    General Comments General comments (skin integrity, edema, etc.): Performed 5xSTS in 19.5 sec (normative for her age is 6.0 sec) indicating decreased functional strength, impaired balance and fall risk.      Pertinent Vitals/Pain Pain Assessment Pain Assessment: No/denies pain    Home Living                          Prior Function            PT Goals (current goals can now be found in the care plan section) Progress towards PT goals: Progressing toward goals    Frequency    Min 3X/week      PT Plan Current plan remains appropriate    Co-evaluation              AM-PAC PT "6 Clicks" Mobility   Outcome Measure  Help needed turning from your back to your side while in a flat bed without using bedrails?: A Little Help needed moving from lying on your back to sitting on the side of a flat bed without using bedrails?: A Little Help needed moving to and from a bed to a chair (including a wheelchair)?: A Little Help needed standing up from a chair using your arms (e.g., wheelchair or bedside chair)?: A Little Help needed to walk in hospital room?: A Little Help needed climbing 3-5 steps with a railing? : A Lot 6 Click Score: 17    End of Session Equipment Utilized During Treatment: Gait belt Activity Tolerance: Patient tolerated treatment well Patient left: in chair;with call bell/phone within reach;with chair alarm set Nurse Communication: Mobility status PT Visit Diagnosis: Other abnormalities of gait and mobility (R26.89);Muscle weakness (generalized) (M62.81)     Time: [id number] PT Time Calculation (min) (ACUTE ONLY): 24 min  Charges:  $Gait Training: 8-22 mins $Therapeutic Activity: 8-22 mins                     Marisa Severin, PT, DPT Acute  Rehabilitation Services Secure chat preferred Office [place] 02/22/2022, 1:08 PM

## 2022-02-22 NOTE — PMR Pre-admission (Signed)
PMR Admission Coordinator Pre-Admission Assessment  Patient: Kaitlyn Mcneil is an 27 y.o., female MRN: [id number] DOB: 03-12-1995 Height: 5' (152.4 cm) Weight: 70.9 kg  Insurance Information HMO:     PPO: yes     PCP:      IPA:      80/20:      OTHER:  PRIMARY: BCBS      Policy#: [id number]      Subscriber: pt through employer CM Name: faxed approval      Phone#: n/a     Fax#: [phone] Pre-Cert#: [id number] [place] for CIR from fax with Nunez with updates due to fax listed above on 03/08/2022      Employer:  Benefits:  Phone #: [phone]     Name:  Eff. Date: 02/03/22     Deduct: $2000 ($0 met)      Out of Pocket Max: $7000 ($0 met)      Life Max: n/a CIR: 80%      SNF: 80% Outpatient: 80%     Co-Pay: 20% Home Health: 80%      Co-Pay: 20% DME: 80%     Co-Pay: 20% Providers:  SECONDARY:       Policy#:      Phone#:   Development worker, community:       Phone#:   The Therapist, art Information Summary" for patients in Inpatient Rehabilitation Facilities with attached "Privacy Act [place] Records" was provided and verbally reviewed with: N/A  Emergency Contact Information Contact Information     Name Relation Home Work Mobile   [place] Mother   [phone]       Current Medical History  Patient Admitting Diagnosis: debility 2/2 acute respiratory failure 2/2 URI superimposed on asthma   History of Present Illness: pt is a 27 y/o female with PMH of asthma, allergic rhinitis, obesity, and eczema who presented to [facility] on 2/5 with acute dyspnea that did not improve with home medications.  Exam positive for fever, cough, and URI symptoms.  In ED started on bipap, steroids, and nebulizers, but she continued to have increased work of breathing and required intubation.  Required significant sedation and paralytics to achieve adequate minute ventilation.  She required ECMO cannulation on 2/6, and was decannulated on 2/8.  She was successfully extubated on 2/15.   Hospital course complicated by delirium, tachycardia, and agitation requiring precedex.  She is tolerating a regular diet.  Initially with Cortrak for supplemental nutrition but this was d/c'd on 2/21.  Therapy evaluations completed and pt was recommended for CIR.     Patient's medical record from [facility] has been reviewed by the rehabilitation admission coordinator and physician.  Past Medical History  Past Medical History:  Diagnosis Date   Asthma     Has the patient had major surgery during 100 days prior to admission? Yes  Family History   family history is not on file.  Current Medications  Current Facility-Administered Medications:    0.9 %  sodium chloride infusion, 250 mL, Intravenous, Continuous, Ramaswamy, Murali, MD, Last Rate: 10 mL/hr at 02/15/22 0246, 500 mL at 02/15/22 0246   0.9 %  sodium chloride infusion, , Intravenous, PRN, Julian Hy, DO, Stopped at 02/17/22 1900   diltiazem (CARDIZEM) tablet 60 mg, 60 mg, Oral, Q6H, Gonfa, Taye T, MD, 60 mg at 02/25/22 1254   enoxaparin (LOVENOX) injection 40 mg, 40 mg, Subcutaneous, Daily, Agarwala, Ravi, MD, 40 mg at 02/25/22 1023   feeding supplement (ENSURE ENLIVE / ENSURE PLUS)  liquid 237 mL, 237 mL, Oral, BID BM, Gonfa, Taye T, MD, 237 mL at 02/25/22 1024   hydrALAZINE (APRESOLINE) tablet 25 mg, 25 mg, Oral, Q6H PRN, Gonfa, Taye T, MD   insulin aspart (novoLOG) injection 0-5 Units, 0-5 Units, Subcutaneous, QHS, Gonfa, Taye T, MD   insulin aspart (novoLOG) injection 0-9 Units, 0-9 Units, Subcutaneous, TID WC, Gonfa, Taye T, MD, 1 Units at 02/25/22 1233   levalbuterol (XOPENEX) nebulizer solution 0.63 mg, 0.63 mg, Nebulization, Q6H PRN, Cyndia Skeeters, Taye T, MD   loratadine (CLARITIN) tablet 10 mg, 10 mg, Oral, Daily, Gonfa, Taye T, MD, 10 mg at 02/25/22 1022   mometasone-formoterol (DULERA) 200-5 MCG/ACT inhaler 2 puff, 2 puff, Inhalation, BID, Noemi Chapel P, DO, 2 puff at 02/25/22 0905   montelukast (SINGULAIR) tablet 10 mg,  10 mg, Oral, QHS, Gonfa, Taye T, MD, 10 mg at 02/24/22 2135   multivitamin with minerals tablet 1 tablet, 1 tablet, Oral, Daily, Gonfa, Taye T, MD, 1 tablet at 02/25/22 1022   ondansetron (ZOFRAN) injection 4 mg, 4 mg, Intravenous, Q6H PRN, Ollis, Brandi L, NP   Oral care mouth rinse, 15 mL, Mouth Rinse, 4 times per day, Agarwala, Einar Grad, MD, 15 mL at 02/25/22 0854   polyethylene glycol (MIRALAX / GLYCOLAX) packet 17 g, 17 g, Oral, BID PRN, Cyndia Skeeters, Taye T, MD   senna-docusate (Senokot-S) tablet 1 tablet, 1 tablet, Oral, Daily, Gonfa, Taye T, MD, 1 tablet at 02/22/22 1000   triamcinolone ointment (KENALOG) 0.1 %, , Topical, BID, Elodia Florence., MD, Given at 02/25/22 1024   umeclidinium bromide (INCRUSE ELLIPTA) 62.5 MCG/ACT 1 puff, 1 puff, Inhalation, Daily, Julian Hy, DO, 1 puff at 02/25/22 0900  Patients Current Diet:  Diet Order             Diet - low sodium heart healthy           Diet regular Room service appropriate? Yes with Assist; Fluid consistency: Thin  Diet effective now                   Precautions / Restrictions Precautions Precautions: Fall Precaution Comments: fall risk increases with fatigue Restrictions Weight Bearing Restrictions: No   Has the patient had 2 or more falls or a fall with injury in the past year? No  Prior Activity Level Community (5-7x/wk): indep prior to admit with no DME, driving, working FT for [employer].  Prior Functional Level Self Care: Did the patient need help bathing, dressing, using the toilet or eating? Independent  Indoor Mobility: Did the patient need assistance with walking from room to room (with or without device)? Independent  Stairs: Did the patient need assistance with internal or external stairs (with or without device)? Independent  Functional Cognition: Did the patient need help planning regular tasks such as shopping or remembering to take medications? Independent  Patient Information Are you of  Hispanic, Latino/a,or Spanish origin?: A. No, not of Hispanic, Latino/a, or Spanish origin What is your race?: B. Black or African American Do you need or want an interpreter to communicate with a doctor or health care staff?: 0. No  Patient's Response To:  Health Literacy and Transportation Is the patient able to respond to health literacy and transportation needs?: Yes Health Literacy - How often do you need to have someone help you when you read instructions, pamphlets, or other written material from your doctor or pharmacy?: Never In the past 12 months, has lack of transportation kept you  from medical appointments or from getting medications?: No In the past 12 months, has lack of transportation kept you from meetings, work, or from getting things needed for daily living?: No  Development worker, international aid / [place] Devices/Equipment: None Home Equipment: None  Prior Device Use: Indicate devices/aids used by the patient prior to current illness, exacerbation or injury? None of the above  Current Functional Level Cognition  Overall Cognitive Status: Within Functional Limits for tasks assessed Current Attention Level: Selective Orientation Level: Oriented X4 Following Commands: Follows one step commands consistently, Follows multi-step commands inconsistently, Follows multi-step commands with increased time Safety/Judgement: Decreased awareness of safety, Decreased awareness of deficits General Comments: continues to have some lapses of memory related to early hospital stay, well aware of endurance deficits    Extremity Assessment (includes Sensation/Coordination)  Upper Extremity Assessment: Generalized weakness  Lower Extremity Assessment: Defer to PT evaluation RLE Deficits / Details: grossly 2+/5 strength LLE Deficits / Details: grossly 2+/5 strength    ADLs  Overall ADL's : Needs assistance/impaired Eating/Feeding: Minimal assistance, Bed level Grooming: Oral  care, Wash/dry hands, Standing, Supervision/safety Upper Body Bathing: Moderate assistance, Sitting Lower Body Bathing: Maximal assistance, Bed level Upper Body Dressing : Set up, Sitting Lower Body Dressing: Set up, Sitting/lateral leans Toilet Transfer: Supervision/safety, Ambulation Toileting- Clothing Manipulation and Hygiene: Supervision/safety, Sit to/from stand Functional mobility during ADLs: Supervision/safety General ADL Comments: Total A for all aspects of ADL including grooming at bed level    Mobility  Overal bed mobility: Modified Independent Bed Mobility: Supine to Sit Supine to sit: Min guard Sit to supine: Min assist General bed mobility comments: up in chair upon PT arrival.    Transfers  Overall transfer level: Needs assistance Equipment used: None Transfers: Sit to/from Stand Sit to Stand: Supervision General transfer comment: no physical assist from bed or toilet    Ambulation / Gait / Stairs / Wheelchair Mobility  Ambulation/Gait Ambulation/Gait assistance: Min guard, Min assist Gait Distance (Feet): 150 Feet (+ 100) Assistive device: Rolling walker (2 wheels), None Gait Pattern/deviations: Step-through pattern, Narrow base of support, Decreased stride length General Gait Details: Steady gait with RW 150', followed by amb without AD min assist Gait velocity: decreased Gait velocity interpretation: <1.8 ft/sec, indicate of risk for recurrent falls Pre-gait activities: Standing weigth shift lateral, a/p. Static march, BIL hands supported.    Posture / Balance Dynamic Sitting Balance Sitting balance - Comments: Sat EOB x 10 minutes with min to mod assist. Balance Overall balance assessment: Needs assistance Sitting-balance support: Feet supported, No upper extremity supported Sitting balance-Leahy Scale: Normal Sitting balance - Comments: Sat EOB x 10 minutes with min to mod assist. Postural control: Posterior lean, Left lateral lean Standing balance  support: During functional activity Standing balance-Leahy Scale: Good Standing balance comment: walking to bathroom and sink without AD High level balance activites: Side stepping, Backward walking High Level Balance Comments: min assist, 10'/trial    Special needs/care consideration N/a   Previous Home Environment (from acute therapy documentation) Living Arrangements: Parent Available Help at Discharge: Family, Available 24 hours/day Type of Home: House Home Layout: Two level, Bed/bath upstairs, 1/2 bath on main level Alternate Level Stairs-Number of Steps: flight Home Access: Level entry Bathroom Shower/Tub: Tub/shower unit, Multimedia programmer: Standard Home Care Services: No Additional Comments: Information per patient. Will need to confirm  Discharge Living Setting Plans for Discharge Living Setting: Patient's home Type of Home at Discharge: Apartment Discharge Home Layout: One level Discharge Home Access: Level  entry Discharge Bathroom Shower/Tub: Tub/shower unit Discharge Bathroom Toilet: Handicapped height Discharge Bathroom Accessibility: Yes How Accessible: Accessible via wheelchair Does the patient have any problems obtaining your medications?: No  Social/Family/Support Systems Anticipated Caregiver: Mod I goals, but mom Lilah Jurkiewicz can provide some supervision if needed Anticipated Caregiver's Contact Information: [phone] Ability/Limitations of Caregiver: works from home Caregiver Availability: Other (Comment) (works from home, can provide intermittent assist if needed, and pt can stay with her) Discharge Plan Discussed with Primary Caregiver: Yes Is Caregiver In Agreement with Plan?: Yes Does Caregiver/Family have Issues with Lodging/Transportation while Pt is in Rehab?: No  Goals Patient/Family Goal for Rehab: PT/OT mod I, SLP n/a Expected length of stay: 9-12 days Additional Information: discharge plan: mod I goals, can d/c to her  apartment if safe to be completely independent, or can d/c to mom's home (confirmed) where she will have some more support/supervision if needed Pt/Family Agrees to Admission and willing to participate: Yes Program Orientation Provided & Reviewed with Pt/Caregiver Including Roles  & Responsibilities: Yes  Barriers to Discharge: Decreased caregiver support  Decrease burden of Care through IP rehab admission: n/a  Possible need for SNF placement upon discharge: Not anticipated. D/C either to pt's home independently or to mom's home with intermittent supervision.   Patient Condition: I have reviewed medical records from [facility], spoken with CM, and patient and family member. I met with patient at the bedside and discussed via phone for inpatient rehabilitation assessment.  Patient will benefit from ongoing PT and OT, can actively participate in 3 hours of therapy a day 5 days of the week, and can make measurable gains during the admission.  Patient will also benefit from the coordinated team approach during an Inpatient Acute Rehabilitation admission.  The patient will receive intensive therapy as well as Rehabilitation physician, nursing, social worker, and care management interventions.  Due to safety, skin/wound care, disease management, medication administration, pain management, and patient education the patient requires 24 hour a day rehabilitation nursing.  The patient is currently min assist with mobility and basic ADLs.  Discharge setting and therapy post discharge at home with home health is anticipated.  Patient has agreed to participate in the Acute Inpatient Rehabilitation Program and will admit today.  Preadmission Screen Completed By:  Michel Santee, PT, DPT 02/25/2022 1:41 PM ______________________________________________________________________   Discussed status with Dr. Naaman Plummer on 02/25/22  at 1:41 PM  and received approval for admission today.  Admission Coordinator:  Michel Santee, PT, time 1:41 PM Sudie Grumbling 02/25/22    Assessment/Plan: Diagnosis: debility after sepsis and acute respiratory failure (status asthmaticus)  Does the need for close, 24 hr/day Medical supervision in concert with the patient's rehab needs make it unreasonable for this patient to be served in a less intensive setting? Yes Co-Morbidities requiring supervision/potential complications: asthma, obesity Due to bladder management, bowel management, safety, skin/wound care, disease management, medication administration, pain management, and patient education, does the patient require 24 hr/day rehab nursing? Yes Does the patient require coordinated care of a physician, rehab nurse, PT, OT to address physical and functional deficits in the context of the above medical diagnosis(es)? Yes Addressing deficits in the following areas: balance, endurance, locomotion, strength, transferring, bowel/bladder control, bathing, dressing, feeding, grooming, toileting, and psychosocial support Can the patient actively participate in an intensive therapy program of at least 3 hrs of therapy 5 days a week? Yes The potential for patient to make measurable gains while on inpatient rehab is excellent Anticipated  functional outcomes upon discharge from inpatient rehab: modified independent PT, modified independent OT, n/a SLP Estimated rehab length of stay to reach the above functional goals is: 5-7 days Anticipated discharge destination: Home 10. Overall Rehab/Functional Prognosis: excellent   MD Signature: Meredith Staggers, MD, [place] Director Rehabilitation Services 02/25/2022

## 2022-02-22 NOTE — Progress Notes (Addendum)
Inpatient Rehab Coordinator Note:  I met with patient at bedside to discuss CIR recommendations and goals/expectations of CIR stay.  We reviewed 3 hrs/day of therapy, physician follow up, and average length of stay 2 weeks (dependent upon progress) with goals of modified independence.  She states she can d/c to her home or her mom's home.  Mom works from home.  She states she thinks she does have insurance through her employer and she is working on finding carrier/policy number so we can update our records.  If pt has insurance will need to seek prior auth.  I gave her my number to f/u with me when she finds out.  Will follow.   Addendum: Spoke to pt's mother, Kaitlyn Mcneil.  I let her know CIR expectations and goals of modified independence.  Kaitlyn Mcneil agrees that if pt not able to safely discharge to her own home independently, she can d/c to her home temporarily where she can provide some more support.  I was also able to confirm pt's insurance information, which is below.  I have updated the admissions team so this will be reflected in her chart.    BCBS policy [id number], group [id number]  Shann Medal, PT, DPT Admissions Coordinator [phone] 02/22/22  11:47 AM

## 2022-02-22 NOTE — Plan of Care (Signed)
  Problem: Education: Goal: Knowledge of General Education information will improve Description: Including pain rating scale, medication(s)/side effects and non-pharmacologic comfort measures Outcome: Progressing   Problem: Health Behavior/Discharge Planning: Goal: Ability to manage health-related needs will improve Outcome: Progressing   Problem: Clinical Measurements: Goal: Respiratory complications will improve Outcome: Progressing   Problem: Clinical Measurements: Goal: Cardiovascular complication will be avoided Outcome: Progressing   Problem: Activity: Goal: Risk for activity intolerance will decrease Outcome: Progressing   Problem: Nutrition: Goal: Adequate nutrition will be maintained Outcome: Progressing   Problem: Elimination: Goal: Will not experience complications related to urinary retention Outcome: Progressing   Problem: Pain Managment: Goal: General experience of comfort will improve Outcome: Progressing   Problem: Safety: Goal: Ability to remain free from injury will improve Outcome: Progressing   Problem: Skin Integrity: Goal: Risk for impaired skin integrity will decrease Outcome: Progressing

## 2022-02-22 NOTE — Progress Notes (Signed)
PROGRESS NOTE  Kaitlyn Mcneil [id number] DOB: 06-Sep-1995   PCP: Pcp, No  Patient is from: Home  DOA: 02/07/2022 LOS: 56  Chief complaints Chief Complaint  Patient presents with   Shortness of Breath     Brief Narrative / Interim history: 27 yo F with PMH of asthma, allergic rhinitis, obesity and eczema who presented 2/5 with acute dyspnea that did not improve with home nebulizers and rescue inhalers.  She had a fever, cough and URI symptoms.  She had sick contacts.   In the ED, she was started on BiPAP, steroid and nebulizers.  However, she continued to have remarkably increased work of breathing and fatigue for which she was intubated.  She required a great deal of sedation and neuromuscular blockade to achieve adequate minute ventilation.  She was placed on ECMO for 48 hours on 2/6 and decannulated 2/8.  Continued an asthma treatment course on ventilator and began weaning off sedation.  She required Precedex and clonidine for agitation.  Eventually, successfully extubated on 2/15 but remained remains delirious with agitation and tachycardia.  Eventually, she came off Precedex, and transferred to [facility] hospitalist service on 2/18.  Patient is currently on room air.  Delirium resolved.  She was on full liquid diet as well as tube feed by cortrack.  Diet advanced to regular by SLP.  Tube feed changed to nocturnal tube improve appetite.   Final disposition CIR once bed available.  Subjective: Seen and examined earlier this morning.  No major events overnight of this morning.  Reports having a nightmare last night.  No chest pain, dyspnea, GI or UTI symptoms.  Able to void without problem after Foley removal.  Objective: Vitals:   02/22/22 0500 02/22/22 0737 02/22/22 0902 02/22/22 1253  BP:   116/63 127/73  Pulse:  85 88 87  Resp:  16  18  Temp:   98.1 F (36.7 C) 98.4 F (36.9 C)  TempSrc:   Oral Oral  SpO2:  100% 100% 97%  Weight: 71.8 kg     Height:         Examination:  GENERAL: No apparent distress.  Nontoxic. HEENT: MMM.  Vision and hearing grossly intact. Cortrack in place. NECK: Supple.  No apparent JVD.  RESP:  No IWOB.  Fair aeration bilaterally. CVS:  RRR. Heart sounds normal.  ABD/GI/GU: BS+. Abd soft, NTND.  MSK/EXT:   No apparent deformity. Moves extremities. No edema.  SKIN: no apparent skin lesion or wound NEURO: Awake and alert. Oriented appropriately.  No apparent focal neuro deficit. PSYCH: Calm. Normal affect.   Procedures:  Intubation from 2/5-2/15 ECMO from 2/6-2/8  Microbiology summarized: COVID-19, influenza and RSV PCR nonreactive Full RVP panel positive for rhinovirus Blood cultures NGTD on 2/-2/15 Tracheal aspirate culture negative on 2/7 Urine culture negative on 2/15  Assessment and plan: Principal Problem:   Acute respiratory distress Active Problems:   Severe persistent asthma with status asthmaticus   Acute respiratory failure with hypoxia and hypercapnia (HCC)   Acute metabolic encephalopathy   Severe sepsis (HCC)   Sinus tachycardia   Delirium   Generalized weakness   Obesity (BMI 30-39.9)   Lactic acidosis  Severe sepsis due to rhinovirus infection: POA.  Had tachycardia, tachypnea, respiratory failure with lactic acidosis on presentation.  Sepsis physiology resolving.   She was on meropenem 2/6-2/9 and vancomycin 2/6-2/8. Extensive infectious workup as above.  -Continues monitoring leukocytosis-improving. -Management as below  Acute respiratory failure with hypoxia and hypercapnia due to status asthmaticus,  severe persistent allergic asthma.  RVP positive for rhinovirus.  Improved.  Respiratory failure seems to have resolved. -Intubation and mechanical ventilation from 2/5-2/15 -ECMO from 2/6-2/8 -Completed steroid course -Continue Dulera and Incruse.  Xopenex as needed. -Continue antihistamine and Singulair on discharge. -Continue pulmonary hygiene -Pulmonology arranging outpatient  follow-up. -PPI if she develops GERD symptoms.  Elevated ALT: Mild and stable.  Could be due to viral illness.  CK within normal. -Continue monitoring -Consider workup if worse  Anemia of critical illness: Stable.  Anemia panel with mild iron deficiency. Recent Labs    02/15/22 0405 02/16/22 0337 02/16/22 0338 02/16/22 1728 02/17/22 0350 02/17/22 0351 02/18/22 0256 02/19/22 0053 02/20/22 0031 02/21/22 0250  HGB 9.5* 10.2* 9.5* 10.5* 9.2* 9.9* 10.8* 9.7* 10.6* 9.7*  -P.o. ferrous sulfate with bowel regimen  Physical deconditioning/fall in the hospital: Likely due to acute illness.  Patient had a fall on 2/19 while in the bathroom and assisted down to the ground.  Did not strike her head or loss conscious.  No trauma or injury.  No focal neurodeficit -OOB, PT/OT-recommended CIR. -Fall precaution.   Oropharyngeal dysphagia: Likely due to prolonged illness and intubation.  Resolved. -Advanced to regular diet by SLP.   Acute metabolic encephalopathy-multifactorial due to sedating medications, high-dose steroids.  Resolved.  -Weaned off oxycodone, Klonopin and clonidine.   Sinus tachycardia: Resolved. -Wean of clonidine today -Start weaning off Cardizem.  Decrease Cardizem to 60 mg 4 times daily   Hyperglycemia: Likely steroid induced.  Hgb 5.9%. -Continue CBG monitoring and SSI.  Changed to ACHS.  Leukocytosis-no obvious source: Demargination?  Seems like she has been off steroid for quite some time now.  Extensive infectious workup unrevealing.  Resolving. -Continue monitoring  Indwelling Foley catheter -Voiding trial today  Obesity/inadequate oral intake Body mass index is 30.91 kg/m. Nutrition Problem: Inadequate oral intake Etiology: acute illness, decreased appetite Signs/Symptoms: per patient/family report, meal completion < 50% Interventions: Refer to RD note for recommendations -Advance to regular diet by SLP but poor p.o. intake remains poor -Agree with  decreasing tube feeds to nocturnal to improve appetite.   DVT prophylaxis:  enoxaparin (LOVENOX) injection 40 mg Start: 02/12/22 1000 SCD's Start: 02/08/22 0300  Code Status: Full code Family Communication: Updated patient's mother over the phone Level of care: Progressive.  Change level of care to MedSurg. Status is: Inpatient Remains inpatient appropriate because: Severe asthma exacerbation, physical deconditioning   Final disposition: CIR? Consultants:  Pulmonology admitted patient  55 minutes with more than 50% spent in reviewing records, counseling patient/family and coordinating care.   Sch Meds:  Scheduled Meds:  diltiazem  60 mg Oral Q6H   enoxaparin (LOVENOX) injection  40 mg Subcutaneous Daily   feeding supplement  237 mL Oral BID BM   feeding supplement (OSMOLITE 1.5 CAL)  1,000 mL Per Tube Q24H   feeding supplement (PROSource TF20)  60 mL Per Tube BID   insulin aspart  0-5 Units Subcutaneous QHS   insulin aspart  0-9 Units Subcutaneous TID WC   loratadine  10 mg Oral Daily   mometasone-formoterol  2 puff Inhalation BID   montelukast  10 mg Oral QHS   multivitamin with minerals  1 tablet Oral Daily   mouth rinse  15 mL Mouth Rinse 4 times per day   senna-docusate  1 tablet Oral Daily   umeclidinium bromide  1 puff Inhalation Daily   Continuous Infusions:  sodium chloride 500 mL (02/15/22 0246)   sodium chloride Stopped (02/17/22 1900)  PRN Meds:.sodium chloride, hydrALAZINE, levalbuterol, ondansetron (ZOFRAN) IV, polyethylene glycol  Antimicrobials: Anti-infectives (From admission, onward)    Start     Dose/Rate Route Frequency Ordered Stop   02/08/22 2100  vancomycin (VANCOCIN) IVPB 1000 mg/200 mL premix  Status:  Discontinued        1,000 mg 200 mL/hr over 60 Minutes Intravenous Every 12 hours 02/08/22 1333 02/10/22 0750   02/08/22 0830  meropenem (MERREM) 1 g in sodium chloride 0.9 % 100 mL IVPB  Status:  Discontinued        1 g 200 mL/hr over 30  Minutes Intravenous Every 8 hours 02/08/22 0742 02/11/22 0819   02/08/22 0830  vancomycin (VANCOCIN) 2,000 mg in sodium chloride 0.9 % 500 mL IVPB        2,000 mg 260 mL/hr over 120 Minutes Intravenous  Once 02/08/22 0742 02/08/22 1052        I have personally reviewed the following labs and images: CBC: Recent Labs  Lab 02/17/22 0350 02/17/22 0351 02/18/22 0256 02/19/22 0053 02/20/22 0031 02/21/22 0250  WBC 25.0*  --  38.1* 23.4* 23.3* 17.2*  NEUTROABS  --   --   --   --   --  12.4*  HGB 9.2* 9.9* 10.8* 9.7* 10.6* 9.7*  HCT 28.8* 29.0* 31.7* 29.4* 32.3* 29.3*  MCV 84.5  --  82.6 83.8 83.5 83.0  PLT 225  --  269 255 267 242   BMP &GFR Recent Labs  Lab 02/16/22 0338 02/16/22 1728 02/17/22 0350 02/17/22 0351 02/21/22 0250 02/22/22 0248  NA 137 140 140 139 134* 136  K 3.8 4.0 3.7 3.9 3.9 4.0  CL 101  --  104  --  99 101  CO2 29  --  27  --  27 26  GLUCOSE 149*  --  130*  --  119* 161*  BUN 16  --  19  --  19 13  CREATININE 0.63  --  0.62  --  0.53 0.51  CALCIUM 8.5*  --  8.7*  --  8.6* 8.9  MG  --   --  2.2  --  1.9 1.8  PHOS  --   --   --   --  4.9* 4.6   Estimated Creatinine Clearance: 95 mL/min (by C-G formula based on SCr of 0.51 mg/dL). Liver & Pancreas: Recent Labs  Lab 02/18/22 1454 02/21/22 0250 02/22/22 0248  AST 41 31 31  ALT 124* 83* 94*  ALKPHOS 48 44 51  BILITOT 0.3 <0.1* 0.3  PROT 5.7* 5.4* 6.0*  ALBUMIN 2.8* 2.7* 2.9*   No results for input(s): "LIPASE", "AMYLASE" in the last 168 hours. No results for input(s): "AMMONIA" in the last 168 hours. Diabetic: No results for input(s): "HGBA1C" in the last 72 hours. Recent Labs  Lab 02/21/22 2114 02/22/22 0000 02/22/22 0359 02/22/22 0901 02/22/22 1236  GLUCAP 145* 133* 150* 151* 137*   Cardiac Enzymes: Recent Labs  Lab 02/21/22 0250 02/22/22 0248  CKTOTAL 94 89   No results for input(s): "PROBNP" in the last 8760 hours. Coagulation Profile: No results for input(s): "INR", "PROTIME"  in the last 168 hours. Thyroid Function Tests: No results for input(s): "TSH", "T4TOTAL", "FREET4", "T3FREE", "THYROIDAB" in the last 72 hours. Lipid Profile: No results for input(s): "CHOL", "HDL", "LDLCALC", "TRIG", "CHOLHDL", "LDLDIRECT" in the last 72 hours. Anemia Panel: Recent Labs    02/22/22 0248  VITAMINB12 1,157*  FOLATE 12.1  FERRITIN 40  TIBC 354  IRON 38  RETICCTPCT  1.7   Urine analysis:    Component Value Date/Time   COLORURINE YELLOW 02/17/2022 1205   APPEARANCEUR TURBID (A) 02/17/2022 1205   LABSPEC 1.015 02/17/2022 1205   PHURINE 7.0 02/17/2022 1205   GLUCOSEU NEGATIVE 02/17/2022 1205   HGBUR LARGE (A) 02/17/2022 1205   BILIRUBINUR NEGATIVE 02/17/2022 1205   KETONESUR NEGATIVE 02/17/2022 1205   PROTEINUR NEGATIVE 02/17/2022 1205   NITRITE NEGATIVE 02/17/2022 1205   LEUKOCYTESUR NEGATIVE 02/17/2022 1205   Sepsis Labs: Invalid input(s): "PROCALCITONIN", "LACTICIDVEN"  Microbiology: Recent Results (from the past 240 hour(s))  Culture, Urine (Do not remove urinary catheter, catheter placed by urology or difficult to place)     Status: None   Collection Time: 02/17/22 12:05 PM   Specimen: Urine, Catheterized  Result Value Ref Range Status   Specimen Description URINE, CATHETERIZED  Final   Special Requests NONE  Final   Culture   Final    NO GROWTH Performed at [facility], [address] [place], [place] [address]    Report Status 02/18/2022 FINAL  Final  Culture, blood (Routine X 2) w Reflex to ID Panel     Status: None (Preliminary result)   Collection Time: 02/18/22  2:57 PM   Specimen: BLOOD  Result Value Ref Range Status   Specimen Description BLOOD BLOOD LEFT HAND  Final   Special Requests   Final    BOTTLES DRAWN AEROBIC ONLY Blood Culture adequate volume   Culture   Final    NO GROWTH 4 DAYS Performed at [facility], [place] [address] [place], [place] [address]    Report Status PENDING  Incomplete  Culture, blood (Routine X  2) w Reflex to ID Panel     Status: None (Preliminary result)   Collection Time: 02/18/22  2:57 PM   Specimen: BLOOD  Result Value Ref Range Status   Specimen Description BLOOD BLOOD RIGHT HAND  Final   Special Requests   Final    BOTTLES DRAWN AEROBIC ONLY Blood Culture adequate volume   Culture   Final    NO GROWTH 4 DAYS Performed at [facility], [place] [address] [place], [place] [address]    Report Status PENDING  Incomplete    Radiology Studies: No results found.    Dwan Hemmelgarn T. Franklin  If 7PM-7AM, please contact night-coverage [email or web address] 02/22/2022, 2:36 PM

## 2022-02-22 NOTE — Progress Notes (Signed)
Nutrition Brief Note  Met with patient at bedside. She reports her appetite is improving. She was able to eat a little over half of her dinner last night (documented as 60%). She also tolerated Ensure supplement started yesterday and nocturnal tube feeds overnight. Pt reports she is hungry today and looking forward to breakfast. She feels stronger today and is able to feed herself breakfast. After discussing with pt and RN, plan is to start 48 hour calorie count to better assess intake and assist with weaning nocturnal tube feeds as intake improves.   RD hung calorie count envelope on the patient's door. RN/NT to document percent consumed for each item on the patient's meal tray ticket and keep in envelope. RN/NT also to document percent of any supplement or snack pt consumes and keep documentation in envelope for RD to review.   Loanne Drilling, MS, RD, LDN, CNSC Pager number available on Amion

## 2022-02-23 DIAGNOSIS — R0603 Acute respiratory distress: Secondary | ICD-10-CM | POA: Diagnosis not present

## 2022-02-23 LAB — CBC
HCT: 32.1 % — ABNORMAL LOW (ref 36.0–46.0)
Hemoglobin: 10.4 g/dL — ABNORMAL LOW (ref 12.0–15.0)
MCH: 27.5 pg (ref 26.0–34.0)
MCHC: 32.4 g/dL (ref 30.0–36.0)
MCV: 84.9 fL (ref 80.0–100.0)
Platelets: 286 10*3/uL (ref 150–400)
RBC: 3.78 MIL/uL — ABNORMAL LOW (ref 3.87–5.11)
RDW: 17.3 % — ABNORMAL HIGH (ref 11.5–15.5)
WBC: 15.7 10*3/uL — ABNORMAL HIGH (ref 4.0–10.5)
nRBC: 0 % (ref 0.0–0.2)

## 2022-02-23 LAB — CULTURE, BLOOD (ROUTINE X 2)
Culture: NO GROWTH
Culture: NO GROWTH
Special Requests: ADEQUATE
Special Requests: ADEQUATE

## 2022-02-23 LAB — RENAL FUNCTION PANEL
Albumin: 3.2 g/dL — ABNORMAL LOW (ref 3.5–5.0)
Anion gap: 10 (ref 5–15)
BUN: 18 mg/dL (ref 6–20)
CO2: 25 mmol/L (ref 22–32)
Calcium: 9 mg/dL (ref 8.9–10.3)
Chloride: 100 mmol/L (ref 98–111)
Creatinine, Ser: 0.51 mg/dL (ref 0.44–1.00)
GFR, Estimated: >60 mL/min (ref >60)
Glucose, Bld: 160 mg/dL — ABNORMAL HIGH (ref 70–99)
Phosphorus: 4.6 mg/dL (ref 2.5–4.6)
Potassium: 3.8 mmol/L (ref 3.5–5.1)
Sodium: 135 mmol/L (ref 135–145)

## 2022-02-23 LAB — GLUCOSE, CAPILLARY
Glucose-Capillary: 123 mg/dL — ABNORMAL HIGH (ref 70–99)
Glucose-Capillary: 132 mg/dL — ABNORMAL HIGH (ref 70–99)
Glucose-Capillary: 151 mg/dL — ABNORMAL HIGH (ref 70–99)
Glucose-Capillary: 160 mg/dL — ABNORMAL HIGH (ref 70–99)
Glucose-Capillary: 169 mg/dL — ABNORMAL HIGH (ref 70–99)
Glucose-Capillary: 209 mg/dL — ABNORMAL HIGH (ref 70–99)

## 2022-02-23 LAB — MAGNESIUM: Magnesium: 1.8 mg/dL (ref 1.7–2.4)

## 2022-02-23 MED ORDER — TRIAMCINOLONE ACETONIDE 0.1 % EX OINT
TOPICAL_OINTMENT | Freq: Two times a day (BID) | CUTANEOUS | Status: DC
  Filled 2022-02-23: qty 15

## 2022-02-23 MED ORDER — PROSOURCE PLUS PO LIQD
30.0000 mL | Freq: Two times a day (BID) | ORAL | Status: DC
  Administered 2022-02-23: 30 mL via ORAL

## 2022-02-23 NOTE — Progress Notes (Signed)
Speech Language Pathology Treatment: Dysphagia  Patient Details Name: Kaitlyn Mcneil MRN: [id number] DOB: Jun 29, 1995 Today's Date: 02/23/2022 Time: [id number] SLP Time Calculation (min) (ACUTE ONLY): 9 min  Assessment / Plan / Recommendation Clinical Impression  Pt alert and upright in chair, reporting no difficulties or s/sx of aspiration with consumption of regular diet since advancement on 2/18. RN also reports no known issues. Upon SLP arrival, RN administering meds whole with water which pt consumed without clinical signs of aspiration. She self-fed regular textures (fresh fruit cup) and thin liquids by straw in single/consecutive swallows with x1 brief delayed cough exhibited. No other s/sx of aspiration observed across multiple trials. She independently consumed POs at a slow rate. Given clinical presentation over recent sessions and pt/RN reports of diet tolerance, recommend continue current diet of regular textures and thin liquids. Adherence to universal swallow precautions also advised. No further SLP f/u indicated at this time and will s/o.    HPI HPI: Pt is a 27 year old female who presented to with acute hypoxic respiratory failure due to asthma exacerbation secondary to rhinovirus. ETT 2/5-2/15. VV-ECMO 2/6-2/8. Pt evaluated by SLP 2/16 and an NPO status was recommended. Full liquid diet initiated 2/17 by PCCM and SLP contacted by hospitalist 2/18 for further diet advancement. PMH: asthma, eczema      SLP Plan  Discharge SLP treatment due to (comment);All goals met      Recommendations for follow up therapy are one component of a multi-disciplinary discharge planning process, led by the attending physician.  Recommendations may be updated based on patient status, additional functional criteria and insurance authorization.    Recommendations  Diet recommendations: Regular;Thin liquid Liquids provided via: Cup;Straw Medication Administration: Whole meds with  liquid Supervision: Patient able to self feed Compensations: Slow rate Postural Changes and/or Swallow Maneuvers: Seated upright 90 degrees                Oral Care Recommendations: Oral care BID Follow Up Recommendations: No SLP follow up Assistance recommended at discharge: PRN SLP Visit Diagnosis: Dysphagia, unspecified (R13.10) Plan: Discharge SLP treatment due to (comment);All goals met            Ellwood Dense, Manuel Garcia, [place] Office Number: [phone]   Acie Fredrickson  02/23/2022, 9:07 AM

## 2022-02-23 NOTE — Progress Notes (Signed)
Inpatient Rehab Admissions Coordinator:   Awaiting determination from [facility] regarding CIR prior auth request.  Will follow.   Shann Medal, PT, DPT Admissions Coordinator [phone] 02/23/22  12:45 PM

## 2022-02-23 NOTE — Progress Notes (Signed)
PROGRESS NOTE    Kaitlyn Mcneil  [id number] DOB: 08/17/1995 DOA: 02/07/2022 PCP: Pcp, No  Chief Complaint  Patient presents with   Shortness of Breath    Brief Narrative:   27 yo F with PMH of asthma, allergic rhinitis, obesity and eczema who presented 2/5 with acute dyspnea that did not improve with home nebulizers and rescue inhalers.  She had Vasilisa Vore fever, cough and URI symptoms.  She had sick contacts.   In the ED, she was started on BiPAP, steroid and nebulizers.  However, she continued to have remarkably increased work of breathing and fatigue for which she was intubated.  She required Clara Smolen great deal of sedation and neuromuscular blockade to achieve adequate minute ventilation.  She was placed on ECMO for 48 hours on 2/6 and decannulated 2/8.  Continued an asthma treatment course on ventilator and began weaning off sedation.  She required Precedex and clonidine for agitation.  Eventually, successfully extubated on 2/15 but remained remains delirious with agitation and tachycardia.  Eventually, she came off Precedex, and transferred to [facility] service on 2/18.   Patient is currently on room air.  Delirium resolved.  She was on full liquid diet as well as tube feed by cortrack.  Diet advanced to regular by SLP.  Tube feed changed to nocturnal tube improve appetite.    Final disposition CIR once bed available.  Assessment & Plan:   Principal Problem:   Acute respiratory distress Active Problems:   Severe persistent asthma with status asthmaticus   Acute respiratory failure with hypoxia and hypercapnia (HCC)   Acute metabolic encephalopathy   Severe sepsis (HCC)   Sinus tachycardia   Delirium   Generalized weakness   Obesity (BMI 30-39.9)   Lactic acidosis  Severe sepsis due to rhinovirus infection: POA.  Had tachycardia, tachypnea, respiratory failure with lactic acidosis on presentation.  Sepsis physiology resolving.   She was on meropenem 2/6-2/9 and vancomycin  2/6-2/8. Extensive infectious workup as above.  -Continues monitoring leukocytosis-improving. -Management as below   Acute respiratory failure with hypoxia and hypercapnia due to status asthmaticus, severe persistent allergic asthma.  RVP positive for rhinovirus.  Improved.  Respiratory failure seems to have resolved. -Intubation and mechanical ventilation from 2/5-2/15 -ECMO from 2/6-2/8 -Completed steroid course -Continue Dulera and Incruse.  Xopenex as needed. -Continue antihistamine and Singulair on discharge. -Continue pulmonary hygiene -Pulmonology arranging outpatient follow-up. -PPI if she develops GERD symptoms.   Elevated ALT: Mild and stable.  Could be due to viral illness.  CK within normal. -Continue monitoring -Consider workup if worse   Anemia of critical illness: Stable.  Anemia panel with mild iron deficiency. -P.o. ferrous sulfate with bowel regimen   Physical deconditioning/fall in the hospital: Likely due to acute illness.  Patient had Kiriana Worthington fall on 2/19 while in the bathroom and assisted down to the ground.  Did not strike her head or loss conscious.  No trauma or injury.  No focal neurodeficit -OOB, PT/OT-recommended CIR. -Fall precaution.   Oropharyngeal dysphagia: Likely due to prolonged illness and intubation.  Resolved. -Advanced to regular diet by SLP -cortrak removed today   Acute metabolic encephalopathy-multifactorial due to sedating medications, high-dose steroids.  Resolved.  -Weaned off oxycodone, Klonopin and clonidine.   Sinus tachycardia: Resolved. -Wean of clonidine today -Start weaning off Cardizem.  Decrease Cardizem to 60 mg 4 times daily.  Will continue to taper as tolerated.   Hyperglycemia: Likely steroid induced.  Hgb 5.9%. -Continue CBG monitoring and SSI.  Changed  to ACHS.   Leukocytosis-no obvious source: Demargination?  Seems like she has been off steroid for quite some time now.  Extensive infectious workup unrevealing.   Resolving. -Continue monitoring -continue to trend, consider heme c/s or referral if persistent   Indwelling Foley catheter Removed 2/20    Obesity/inadequate oral intake Body mass index is 30.8 kg/m.    DVT prophylaxis: lovenox Code Status: full Family Communication: none Disposition:   Status is: Inpatient Remains inpatient appropriate because: pending further improvement, insurance decision on rehab   Consultants:  PCCM HF service  Procedures:  Echo IMPRESSIONS     1. Left ventricular ejection fraction, by estimation, is 70 to 75%. The  left ventricle has normal function.   2. Right ventricular systolic function is normal. The right ventricular  size is normal.   3. No left atrial/left atrial appendage thrombus was detected.   4. The mitral valve is normal in structure. Trivial mitral valve  regurgitation.   5. The aortic valve is normal in structure. Aortic valve regurgitation is  not visualized.   6. VV ECMO cannula well positioned with return flow directed optimally  into RA.   IMPRESSIONS     1. Left ventricular ejection fraction, by estimation, is 70 to 75%. The  left ventricle has hyperdynamic function.   2. Right ventricular systolic function is normal. The right ventricular  size is normal.   3. No left atrial/left atrial appendage thrombus was detected.   4. The mitral valve is normal in structure. No evidence of mitral valve  regurgitation.   5. The aortic valve is normal in structure. Aortic valve regurgitation is  not visualized.   6. TEE done to assess postioning of VV ECMO cannula. Cannula adequately  positioned with excellent return flow directed into RA.   Successful VV ECMO placement.   Antimicrobials:  Anti-infectives (From admission, onward)    Start     Dose/Rate Route Frequency Ordered Stop   02/08/22 2100  vancomycin (VANCOCIN) IVPB 1000 mg/200 mL premix  Status:  Discontinued        1,000 mg 200 mL/hr over 60 Minutes  Intravenous Every 12 hours 02/08/22 1333 02/10/22 0750   02/08/22 0830  meropenem (MERREM) 1 g in sodium chloride 0.9 % 100 mL IVPB  Status:  Discontinued        1 g 200 mL/hr over 30 Minutes Intravenous Every 8 hours 02/08/22 0742 02/11/22 0819   02/08/22 0830  vancomycin (VANCOCIN) 2,000 mg in sodium chloride 0.9 % 500 mL IVPB        2,000 mg 260 mL/hr over 120 Minutes Intravenous  Once 02/08/22 0742 02/08/22 1052       Subjective: No complaints  Objective: Vitals:   02/23/22 0008 02/23/22 0353 02/23/22 0800 02/23/22 1421  BP: 125/71 131/78  130/74  Pulse: 88 91  93  Resp: 19 18  18  $ Temp: 97.9 F (36.6 C) 98.3 F (36.8 C)  98.4 F (36.9 C)  TempSrc: Oral Oral  Oral  SpO2: 98% 98% 99% 100%  Weight:  71.5 kg    Height:        Intake/Output Summary (Last 24 hours) at 02/23/2022 1442 Last data filed at 02/23/2022 0900 Gross per 24 hour  Intake 1380 ml  Output 700 ml  Net 680 ml   Filed Weights   02/21/22 0441 02/22/22 0500 02/23/22 0353  Weight: 74.2 kg 71.8 kg 71.5 kg    Examination:  General exam: Appears calm and comfortable  Respiratory  system: unlabored Cardiovascular system: RRR Gastrointestinal system: NG in place Central nervous system: Alert and oriented. No focal neurological deficits. Extremities: no LEE   Data Reviewed: I have personally reviewed following labs and imaging studies  CBC: Recent Labs  Lab 02/18/22 0256 02/19/22 0053 02/20/22 0031 02/21/22 0250 02/23/22 0355  WBC 38.1* 23.4* 23.3* 17.2* 15.7*  NEUTROABS  --   --   --  12.4*  --   HGB 10.8* 9.7* 10.6* 9.7* 10.4*  HCT 31.7* 29.4* 32.3* 29.3* 32.1*  MCV 82.6 83.8 83.5 83.0 84.9  PLT 269 255 267 242 [id number]    Basic Metabolic Panel: Recent Labs  Lab 02/17/22 0350 02/17/22 0351 02/21/22 0250 02/22/22 0248 02/23/22 0355  NA 140 139 134* 136 135  K 3.7 3.9 3.9 4.0 3.8  CL 104  --  99 101 100  CO2 27  --  27 26 25  $ GLUCOSE 130*  --  119* 161* 160*  BUN 19  --  19 13 18   $ CREATININE 0.62  --  0.53 0.51 0.51  CALCIUM 8.7*  --  8.6* 8.9 9.0  MG 2.2  --  1.9 1.8 1.8  PHOS  --   --  4.9* 4.6 4.6    GFR: Estimated Creatinine Clearance: 94.9 mL/min (by C-G formula based on SCr of 0.51 mg/dL).  Liver Function Tests: Recent Labs  Lab 02/18/22 1454 02/21/22 0250 02/22/22 0248 02/23/22 0355  AST 41 31 31  --   ALT 124* 83* 94*  --   ALKPHOS 48 44 51  --   BILITOT 0.3 <0.1* 0.3  --   PROT 5.7* 5.4* 6.0*  --   ALBUMIN 2.8* 2.7* 2.9* 3.2*    CBG: Recent Labs  Lab 02/22/22 2043 02/23/22 0011 02/23/22 0355 02/23/22 0822 02/23/22 1140  GLUCAP 126* 209* 160* 151* 123*     Recent Results (from the past 240 hour(s))  Culture, Urine (Do not remove urinary catheter, catheter placed by urology or difficult to place)     Status: None   Collection Time: 02/17/22 12:05 PM   Specimen: Urine, Catheterized  Result Value Ref Range Status   Specimen Description URINE, CATHETERIZED  Final   Special Requests NONE  Final   Culture   Final    NO GROWTH Performed at [facility], [place] [address], [place], [place] [address]    Report Status 02/18/2022 FINAL  Final  Culture, blood (Routine X 2) w Reflex to ID Panel     Status: None   Collection Time: 02/18/22  2:57 PM   Specimen: BLOOD  Result Value Ref Range Status   Specimen Description BLOOD BLOOD LEFT HAND  Final   Special Requests   Final    BOTTLES DRAWN AEROBIC ONLY Blood Culture adequate volume   Culture   Final    NO GROWTH 5 DAYS Performed at [facility], [place] [address], [place], [place] [address]    Report Status 02/23/2022 FINAL  Final  Culture, blood (Routine X 2) w Reflex to ID Panel     Status: None   Collection Time: 02/18/22  2:57 PM   Specimen: BLOOD  Result Value Ref Range Status   Specimen Description BLOOD BLOOD RIGHT HAND  Final   Special Requests   Final    BOTTLES DRAWN AEROBIC ONLY Blood Culture adequate volume   Culture   Final    NO GROWTH 5 DAYS Performed at  [facility], [place] [address]  [place], [place] [address]    Report Status 02/23/2022 FINAL  Final         Radiology Studies: No results found.      Scheduled Meds:  diltiazem  60 mg Oral Q6H   enoxaparin (LOVENOX) injection  40 mg Subcutaneous Daily   feeding supplement  237 mL Oral BID BM   insulin aspart  0-5 Units Subcutaneous QHS   insulin aspart  0-9 Units Subcutaneous TID WC   loratadine  10 mg Oral Daily   mometasone-formoterol  2 puff Inhalation BID   montelukast  10 mg Oral QHS   multivitamin with minerals  1 tablet Oral Daily   mouth rinse  15 mL Mouth Rinse 4 times per day   senna-docusate  1 tablet Oral Daily   triamcinolone ointment   Topical BID   umeclidinium bromide  1 puff Inhalation Daily   Continuous Infusions:  sodium chloride 500 mL (02/15/22 0246)   sodium chloride Stopped (02/17/22 1900)     LOS: 16 days    Time spent: over 30 min    Fayrene Helper, MD [facility]   To contact the attending provider between 7A-7P or the covering provider during after hours 7P-7A, please log into the web site [email or web address] and access using universal [place] password for that web site. If you do not have the password, please call the hospital operator.  02/23/2022, 2:42 PM

## 2022-02-23 NOTE — Progress Notes (Signed)
Nutrition Follow-up  DOCUMENTATION CODES:   Not applicable  INTERVENTION:  Nocturnal tube feeds were discontinued and Cortrak tube was removed in setting of improved PO intake.  Calorie count discontinued.  Encouraged ongoing adequate intake of calories and protein at meals.  Continue Ensure Enlive po BID, each supplement provides 350 kcal and 20 grams of protein.  Continue multivitamin with minerals po daily.  NUTRITION DIAGNOSIS:   Inadequate oral intake related to acute illness, decreased appetite as evidenced by per patient/family report, meal completion < 50%.  Improving - pt with improved PO intake but still requiring oral nutrition supplements to meet 100% needs.  GOAL:   Patient will meet greater than or equal to 90% of their needs  Met with oral nutrition supplements.  MONITOR:   PO intake, Supplement acceptance, Labs, Weight trends, TF tolerance, I & O's  REASON FOR ASSESSMENT:   Consult, Ventilator Enteral/tube feeding initiation and management (ECMO)  ASSESSMENT:   27 yo female admitted with acute respiratory failure due to severe asthma exacerbation requiring intubation with deep sedation, paralyzed and cannulated for VV ECMO. PMH includes asthma, AR, eczema  2/5: admitted, intubated, deep sedation and paralyzed on nimbex gtt 2/6: VV ECMO cannulation at 12AM 2/7: Cortrak (gastric), trickle TF, off nimbex 2/8: VV ECMO decannulation 2/12: tube feeds had advanced to goal rate over the weekend 2/15: extubated, rectal tube removed 2/16: seen by SLP and recommended to be NPO 2/17: diet advanced to full liquids 2/18: diet advanced to regular with thin liquids 2/19: transitioned to nocturnal tube feeds   Met with pt at bedside. She reports her appetite continues to improve. 24 hour calorie count was completed and pt met 100% of her estimated kcal and protein needs from PO intake of meals and oral nutrition supplements. Pt is amenable to continue to drink  Ensure to help meet calorie/protein needs. She reports her strength continues to improve and she has been able to feed herself. Denies nausea, emesis, or abdominal pain. Denies any nutrition questions or concerns today. Pt is looking forward to having Cortrak tube removed.  Admission wt was 71.1 kg on 02/07/22. Wt got up to highest of 78.8 kg on 02/11/22. Suspect may have been related to fluid status. Current wt is 71.5 kg (157.7 lbs). Will continue to monitor weight trends.  Enteral Access: 10 Fr. Cortrak tube placed 2/7; 68 cm in left nare secured with bridle; terminates in distal gastric body per abdominal x-ray 2/7 and per chest x-ray 2/9 feeding tube is unchanged   Tube feeds: Osmolite 1.5 at 65 mL/hour x 12 hours overnight from 1800-0600 + PROSource TF20 60 mL BID  Medications reviewed and include: diltiazem, Ensure Enlive po BID, Novolog 0-9 units TID, Novolog 0-5 units QHS, senna-docusate 1 tablet daily, MVI  Labs reviewed: CBG 123-209 Iron: 38 WNL 2/20 TIBC: 354 WNL 2/20 Ferritin 40 WNL 2/20 Folate 12.1 WNL 2/20 Vitamin B12 1157 H 2/20  UOP: 700 mL (0.4 mL/kg/hr) + 2 occurrences unmeasured UOP  I/O: -6643.8 mL since 02/09/22  Discussed with MD via secure chat. Plan is to stop tube feeds and remove Cortrak tube in setting of improved oral intake. Discussed with RN. Noted possible plan for CIR pending insurance authorization.  Diet Order:   Diet Order             Diet regular Room service appropriate? Yes with Assist; Fluid consistency: Thin  Diet effective now  EDUCATION NEEDS:   No education needs have been identified at this time  Skin:  Skin Assessment: Reviewed RN Assessment  Last BM:  02/21/22 - medium type 7  Height:   Ht Readings from Last 1 Encounters:  02/07/22 5' (1.524 m)   Weight:   Wt Readings from Last 1 Encounters:  02/23/22 71.5 kg   BMI:  Body mass index is 30.8 kg/m.  Estimated Nutritional Needs:   Kcal:  2000-2200  kcals  Protein:  115-135 g  Fluid:  >/= 2L  Natassja Ollis Magda Paganini, MS, RD, LDN, CNSC Pager number available on Amion

## 2022-02-23 NOTE — Plan of Care (Signed)
  Problem: Respiratory: Goal: Ability to maintain a clear airway and adequate ventilation will improve Outcome: Progressing   Problem: Activity: Goal: Ability to tolerate increased activity will improve Outcome: Progressing   Problem: Education: Goal: Knowledge of General Education information will improve Description: Including pain rating scale, medication(s)/side effects and non-pharmacologic comfort measures Outcome: Progressing   Problem: Health Behavior/Discharge Planning: Goal: Ability to manage health-related needs will improve Outcome: Progressing   Problem: Clinical Measurements: Goal: Cardiovascular complication will be avoided Outcome: Progressing   Problem: Nutrition: Goal: Adequate nutrition will be maintained Outcome: Progressing   Problem: Coping: Goal: Level of anxiety will decrease Outcome: Progressing   Problem: Elimination: Goal: Will not experience complications related to urinary retention Outcome: Progressing   Problem: Pain Managment: Goal: General experience of comfort will improve Outcome: Progressing   Problem: Safety: Goal: Ability to remain free from injury will improve Outcome: Progressing   Problem: Skin Integrity: Goal: Risk for impaired skin integrity will decrease Outcome: Progressing

## 2022-02-23 NOTE — Progress Notes (Signed)
Calorie Count Note  48 hour calorie count ordered. Day 1 results:  Diet: regular Supplements: Ensure Enlive po BID (350 kcal, 20 grams of protein per bottle)  Breakfast 2/20: 100% create-your-own omelet with cheddar, mushrooms, ham, bacon, onion, peppers, spinach, 100% [place] strawberry yogurt, 100% 2 cups orange juice (517 kcal, 39 grams of protein) Lunch 2/20: 50% crispy baked chicken, 50% mashed potatoes with gravy, 50% macaroni and cheese, 50% dinner roll, 100% cranberry juice (352 kcal, 16 grams of protein) Dinner 2/20: 50% salad with ranch, 50% tilapia, 50% rice, 50% carrots, 50% roll, 50% fruit cup, 50% milk (322 kcal, 16 grams of protein) Snack 2/20 at 8PM: 100% [place] sandwich with lettuce and tomato (260 kcal, 20 grams of protein) Supplements: 2 bottles of Ensure Enlive (700 kcal, 40 grams of protein)  Total intake day 1: 2151 kcal (100% of estimated needs)  131 grams of protein (100% of estimated needs)  Estimated Nutritional Needs:  Kcal:  2000-2200 kcals Protein:  115-135 g Fluid:  >/= 2L  Nutrition Dx: Inadequate oral intake related to acute illness, decreased appetite as evidenced by per patient/family report, meal completion < 50%.   Goal: Patient will meet greater than or equal to 90% of their needs   Intervention:  -See follow-up note from 02/23/22 for updated nutrition intervention.  Loanne Drilling, MS, RD, LDN, CNSC Pager number available on Amion

## 2022-02-24 DIAGNOSIS — R0603 Acute respiratory distress: Secondary | ICD-10-CM | POA: Diagnosis not present

## 2022-02-24 LAB — CBC WITH DIFFERENTIAL/PLATELET
Abs Immature Granulocytes: 0.8 10*3/uL — ABNORMAL HIGH (ref 0.00–0.07)
Basophils Absolute: 0 10*3/uL (ref 0.0–0.1)
Basophils Relative: 0 %
Eosinophils Absolute: 0.5 10*3/uL (ref 0.0–0.5)
Eosinophils Relative: 3 %
HCT: 31.6 % — ABNORMAL LOW (ref 36.0–46.0)
Hemoglobin: 10.4 g/dL — ABNORMAL LOW (ref 12.0–15.0)
Lymphocytes Relative: 18 %
Lymphs Abs: 2.7 10*3/uL (ref 0.7–4.0)
MCH: 27.8 pg (ref 26.0–34.0)
MCHC: 32.9 g/dL (ref 30.0–36.0)
MCV: 84.5 fL (ref 80.0–100.0)
Monocytes Absolute: 0.3 10*3/uL (ref 0.1–1.0)
Monocytes Relative: 2 %
Myelocytes: 5 %
Neutro Abs: 10.9 10*3/uL — ABNORMAL HIGH (ref 1.7–7.7)
Neutrophils Relative %: 72 %
Platelets: 303 10*3/uL (ref 150–400)
RBC: 3.74 MIL/uL — ABNORMAL LOW (ref 3.87–5.11)
RDW: 17.4 % — ABNORMAL HIGH (ref 11.5–15.5)
WBC: 15.2 10*3/uL — ABNORMAL HIGH (ref 4.0–10.5)
nRBC: 0 % (ref 0.0–0.2)
nRBC: 0 /100 WBC

## 2022-02-24 LAB — COMPREHENSIVE METABOLIC PANEL
ALT: 83 U/L — ABNORMAL HIGH (ref 0–44)
AST: 26 U/L (ref 15–41)
Albumin: 3.1 g/dL — ABNORMAL LOW (ref 3.5–5.0)
Alkaline Phosphatase: 51 U/L (ref 38–126)
Anion gap: 14 (ref 5–15)
BUN: 17 mg/dL (ref 6–20)
CO2: 17 mmol/L — ABNORMAL LOW (ref 22–32)
Calcium: 9.1 mg/dL (ref 8.9–10.3)
Chloride: 106 mmol/L (ref 98–111)
Creatinine, Ser: 0.52 mg/dL (ref 0.44–1.00)
GFR, Estimated: >60 mL/min (ref >60)
Glucose, Bld: 106 mg/dL — ABNORMAL HIGH (ref 70–99)
Potassium: 4.7 mmol/L (ref 3.5–5.1)
Sodium: 137 mmol/L (ref 135–145)
Total Bilirubin: 0.6 mg/dL (ref 0.3–1.2)
Total Protein: 6.2 g/dL — ABNORMAL LOW (ref 6.5–8.1)

## 2022-02-24 LAB — GLUCOSE, CAPILLARY
Glucose-Capillary: 101 mg/dL — ABNORMAL HIGH (ref 70–99)
Glucose-Capillary: 106 mg/dL — ABNORMAL HIGH (ref 70–99)
Glucose-Capillary: 107 mg/dL — ABNORMAL HIGH (ref 70–99)
Glucose-Capillary: 111 mg/dL — ABNORMAL HIGH (ref 70–99)
Glucose-Capillary: 112 mg/dL — ABNORMAL HIGH (ref 70–99)
Glucose-Capillary: 95 mg/dL (ref 70–99)

## 2022-02-24 NOTE — Progress Notes (Signed)
Inpatient Rehab Admissions Coordinator:   Awaiting insurance auth for CIR.  Will call today for update.    Shann Medal, PT, DPT Admissions Coordinator [phone] 02/24/22  11:12 AM
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
## 2022-02-24 NOTE — Progress Notes (Signed)
Occupational Therapy Treatment Patient Details Name: Kaitlyn Mcneil MRN: [id number] DOB: 1995/01/21 Today's Date: 02/24/2022   History of present illness Pt is 27 year old presented to [facility] on  02/07/22 with acute hypoxic respiratory failure due to asthma exacerbation secondary to rhinovirus. Intubated 2/5-2/15. VV-ECMO 2/6-2/8. PMH - asthma, eczema   OT comments  Pt walking without AD about her room and to bathroom with supervision. Completes standing grooming with supervision, dressing with set up. Pt able to recall information from previous PT session, access and use apps on her cell phone, order meals from cafeteria. Pt aware of impaired endurance with prolonged standing and walking. Began educating in energy conservation strategies and recommended pt consider seated showering. 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    Recommendations for follow up therapy are one component of a multi-disciplinary discharge planning process, led by the attending physician.  Recommendations may be updated based on patient status, additional functional criteria and insurance authorization.    Follow Up Recommendations  Acute inpatient rehab (3hours/day)     Assistance Recommended at Discharge Intermittent Supervision/Assistance  Patient can return home with the following  Assistance with cooking/housework;Assist for transportation;Help with stairs or ramp for entrance   Equipment Recommendations  Tub/shower seat    Recommendations for Other Services      Precautions / Restrictions Precautions Precautions: Fall Precaution Comments: fall risk increases with fatigue Restrictions Weight Bearing Restrictions: No       Mobility Bed Mobility Overal bed mobility: Modified Independent                  Transfers   Equipment used: None Transfers: Sit to/from Stand Sit to Stand: Supervision           General transfer comment:  no physical assist from bed or toilet     Balance Overall balance assessment: Needs assistance   Sitting balance-Leahy Scale: Normal       Standing balance-Leahy Scale: Good Standing balance comment: walking to bathroom and sink without AD                           ADL either performed or assessed with clinical judgement   ADL Overall ADL's : Needs assistance/impaired     Grooming: Oral care;Wash/dry hands;Standing;Supervision/safety           Upper Body Dressing : Set up;Sitting   Lower Body Dressing: Set up;Sitting/lateral leans   Toilet Transfer: Supervision/safety;Ambulation   Toileting- Clothing Manipulation and Hygiene: Supervision/safety;Sit to/from stand       Functional mobility during ADLs: Supervision/safety      Extremity/Trunk Assessment              Vision       Perception     Praxis      Cognition Arousal/Alertness: Awake/alert Behavior During Therapy: WFL for tasks assessed/performed Overall Cognitive Status: Within Functional Limits for tasks assessed                                 General Comments: continues to have some lapses of memory related to early hospital stay, well aware of endurance deficits        Exercises      Shoulder Instructions       General Comments      Pertinent Vitals/ Pain  Pain Assessment Pain Assessment: No/denies pain  Home Living                                          Prior Functioning/Environment              Frequency  Min 2X/week        Progress Toward Goals  OT Goals(current goals can now be found in the care plan section)  Progress towards OT goals: Progressing toward goals  Acute Rehab OT Goals OT Goal Formulation: With patient Time For Goal Achievement: 03/04/22 Potential to Achieve Goals: Good  Plan Discharge plan remains appropriate    Co-evaluation                 AM-PAC OT "6 Clicks" Daily Activity      Outcome Measure   Help from another person eating meals?: None Help from another person taking care of personal grooming?: None Help from another person toileting, which includes using toliet, bedpan, or urinal?: None Help from another person bathing (including washing, rinsing, drying)?: None Help from another person to put on and taking off regular upper body clothing?: None Help from another person to put on and taking off regular lower body clothing?: None 6 Click Score: 24    End of Session Equipment Utilized During Treatment: Gait belt  OT Visit Diagnosis: Muscle weakness (generalized) (M62.81);Other (comment) (decreased activity tolerance)   Activity Tolerance Patient tolerated treatment well   Patient Left in bed;with call bell/phone within reach   Nurse Communication          Time: 1410-1430 OT Time Calculation (min): 20 min  Charges: OT General Charges $OT Visit: 1 Visit OT Treatments $Self Care/Home Management : 8-22 mins  Cleta Alberts, OTR/L Acute Rehabilitation Services Office: [phone]   Malka So 02/24/2022, 2:36 PM

## 2022-02-24 NOTE — Progress Notes (Signed)
Physical Therapy Treatment Patient Details Name: Kaitlyn Mcneil MRN: [id number] DOB: December 12, 1995 Today's Date: 02/24/2022   History of Present Illness Pt is 27 year old presented to [facility] on  02/07/22 with acute hypoxic respiratory failure due to asthma exacerbation secondary to rhinovirus. Intubated 2/5-2/15. VV-ECMO 2/6-2/8. PMH - asthma, eczema    PT Comments    Pt received sitting EOB. Min guard assist transfers, min guard assist amb 150' with RW, and min assist amb 100' without AD. Pt progressing well with mobility. Current POC remains appropriate.    Recommendations for follow up therapy are one component of a multi-disciplinary discharge planning process, led by the attending physician.  Recommendations may be updated based on patient status, additional functional criteria and insurance authorization.  Follow Up Recommendations  Acute inpatient rehab (3hours/day)     Assistance Recommended at Discharge Frequent or constant Supervision/Assistance  Patient can return home with the following Assist for transportation;Assistance with cooking/housework;Direct supervision/assist for medications management;Direct supervision/assist for financial management;A little help with walking and/or transfers;A little help with bathing/dressing/bathroom   Equipment Recommendations  Other (comment) (TBA)    Recommendations for Other Services       Precautions / Restrictions Precautions Precautions: Fall Restrictions Weight Bearing Restrictions: No     Mobility  Bed Mobility Overal bed mobility: Modified Independent                  Transfers Overall transfer level: Needs assistance Equipment used: None, Rolling walker (2 wheels) Transfers: Sit to/from Stand Sit to Stand: Min guard           General transfer comment: multi STS with/without RW    Ambulation/Gait Ambulation/Gait assistance: Min guard, Min assist Gait Distance (Feet): 150 Feet (+ 100) Assistive device:  Rolling walker (2 wheels), None Gait Pattern/deviations: Step-through pattern, Narrow base of support, Decreased stride length Gait velocity: decreased     General Gait Details: Steady gait with RW 150', followed by amb without AD min assist   Stairs             Wheelchair Mobility    Modified Rankin (Stroke Patients Only)       Balance Overall balance assessment: Needs assistance Sitting-balance support: Feet supported, No upper extremity supported Sitting balance-Leahy Scale: Good     Standing balance support: During functional activity Standing balance-Leahy Scale: Fair               High level balance activites: Side stepping, Backward walking High Level Balance Comments: min assist, 10'/trial            Cognition Arousal/Alertness: Awake/alert Behavior During Therapy: WFL for tasks assessed/performed Overall Cognitive Status: Impaired/Different from baseline Area of Impairment: Attention, Memory, Following commands, Problem solving                   Current Attention Level: Selective Memory: Decreased short-term memory Following Commands: Follows one step commands consistently, Follows multi-step commands inconsistently, Follows multi-step commands with increased time     Problem Solving: Decreased initiation, Slow processing, Difficulty sequencing, Requires verbal cues          Exercises      General Comments        Pertinent Vitals/Pain Pain Assessment Pain Assessment: No/denies pain    Home Living                          Prior Function  PT Goals (current goals can now be found in the care plan section) Acute Rehab PT Goals Patient Stated Goal: rehab then home Progress towards PT goals: Progressing toward goals    Frequency    Min 3X/week      PT Plan Current plan remains appropriate    Co-evaluation              AM-PAC PT "6 Clicks" Mobility   Outcome Measure  Help needed  turning from your back to your side while in a flat bed without using bedrails?: None Help needed moving from lying on your back to sitting on the side of a flat bed without using bedrails?: A Little Help needed moving to and from a bed to a chair (including a wheelchair)?: A Little Help needed standing up from a chair using your arms (e.g., wheelchair or bedside chair)?: A Little Help needed to walk in hospital room?: A Little Help needed climbing 3-5 steps with a railing? : A Lot 6 Click Score: 18    End of Session Equipment Utilized During Treatment: Gait belt Activity Tolerance: Patient tolerated treatment well Patient left: in chair;with call bell/phone within reach Nurse Communication: Mobility status PT Visit Diagnosis: Other abnormalities of gait and mobility (R26.89);Muscle weakness (generalized) (M62.81)     Time: [id number] PT Time Calculation (min) (ACUTE ONLY): 15 min  Charges:  $Gait Training: 8-22 mins                     Gloriann Loan., PT  Office # [phone]    Lorriane Shire 02/24/2022, 10:50 AM

## 2022-02-24 NOTE — Progress Notes (Signed)
PROGRESS NOTE    Kaitlyn Mcneil  [id number] DOB: [birth year] DOA: 02/07/2022 PCP: Pcp, No  Chief Complaint  Patient presents with   Shortness of Breath    Brief Narrative:   27 yo F with PMH of asthma, allergic rhinitis, obesity and eczema who presented 2/5 with acute dyspnea that did not improve with home nebulizers and rescue inhalers.  She had Kaitlyn Mcneil fever, cough and URI symptoms.  She had sick contacts.   In the ED, she was started on BiPAP, steroid and nebulizers.  However, she continued to have remarkably increased work of breathing and fatigue for which she was intubated.  She required Kaitlyn Mcneil great deal of sedation and neuromuscular blockade to achieve adequate minute ventilation.  She was placed on ECMO for 48 hours on 2/6 and decannulated 2/8.  Continued an asthma treatment course on ventilator and began weaning off sedation.  She required Kaitlyn Mcneil and clonidine for agitation.  Eventually, successfully extubated on 2/15 but remained remains delirious with agitation and tachycardia.  Eventually, she came off Kaitlyn Mcneil, and transferred to Kaitlyn Mcneil service on 2/18.   Patient is currently on room air.  Delirium resolved.  She was on full liquid diet as well as tube feed by cortrack.  Diet advanced to regular by SLP.  Tube feed changed to nocturnal tube improve appetite.    Final disposition CIR once bed available.  Assessment & Plan:   Principal Problem:   Acute respiratory distress Active Problems:   Severe persistent asthma with status asthmaticus   Acute respiratory failure with hypoxia and hypercapnia (HCC)   Acute metabolic encephalopathy   Severe sepsis (HCC)   Sinus tachycardia   Delirium   Generalized weakness   Obesity (BMI 30-39.9)   Lactic acidosis  Severe sepsis due to rhinovirus infection: POA.  Had tachycardia, tachypnea, respiratory failure with lactic acidosis on presentation.  Sepsis physiology resolving.   She was on meropenem 2/6-2/9 and vancomycin  2/6-2/8. Extensive infectious workup as above.  -Continues monitoring leukocytosis-improving. -Management as below   Acute respiratory failure with hypoxia and hypercapnia due to status asthmaticus, severe persistent allergic asthma.  RVP positive for rhinovirus.  Improved.  Respiratory failure seems to have resolved. -Intubation and mechanical ventilation from 2/5-2/15 -ECMO from 2/6-2/8 -Completed steroid course -Continue Dulera and Incruse.  Xopenex as needed. -Continue antihistamine and Singulair on discharge. -Continue pulmonary hygiene -Pulmonology arranging outpatient follow-up. -PPI if she develops GERD symptoms.   Elevated ALT: Mild and stable.  Could be due to viral illness.  CK within normal. -Continue monitoring -Consider workup if worse   Anemia of critical illness: Stable.  Anemia panel with mild iron deficiency. -P.o. ferrous sulfate with bowel regimen   Physical deconditioning/fall in the hospital: Likely due to acute illness.  Patient had Kaitlyn Mcneil fall on 2/19 while in the bathroom and assisted down to the ground.  Did not strike her head or loss conscious.  No trauma or injury.  No focal neurodeficit -OOB, PT/OT-recommended CIR. -Fall precaution.   Oropharyngeal dysphagia: Likely due to prolonged illness and intubation.  Resolved. -Advanced to regular diet by SLP -cortrak removed today   Acute metabolic encephalopathy-multifactorial due to sedating medications, high-dose steroids.  Resolved.  -Weaned off oxycodone, Klonopin and clonidine.   Sinus tachycardia: Resolved. -Wean of clonidine today -Start weaning off Cardizem.  Decrease Cardizem to 60 mg 4 times daily.  Will continue to taper as tolerated.   Hyperglycemia: Likely steroid induced.  Hgb 5.9%. -Continue CBG monitoring and SSI.  Changed  to ACHS.   Leukocytosis-no obvious source: Demargination?  Seems like she has been off steroid for quite some time now.  Extensive infectious workup unrevealing.   Resolving. -Continue monitoring -continue to trend, consider heme c/s or referral if persistent   Indwelling Foley catheter Removed 2/20    Obesity/inadequate oral intake Body mass index is 30.83 kg/m.  Eczema Triamcinolone    DVT prophylaxis: lovenox Code Status: full Family Communication: none Disposition:   Status is: Inpatient Remains inpatient appropriate because: pending further improvement, insurance decision on rehab   Consultants:  PCCM HF service  Procedures:  Echo IMPRESSIONS     1. Left ventricular ejection fraction, by estimation, is 70 to 75%. The  left ventricle has normal function.   2. Right ventricular systolic function is normal. The right ventricular  size is normal.   3. No left atrial/left atrial appendage thrombus was detected.   4. The mitral valve is normal in structure. Trivial mitral valve  regurgitation.   5. The aortic valve is normal in structure. Aortic valve regurgitation is  not visualized.   6. VV ECMO cannula well positioned with return flow directed optimally  into RA.   IMPRESSIONS     1. Left ventricular ejection fraction, by estimation, is 70 to 75%. The  left ventricle has hyperdynamic function.   2. Right ventricular systolic function is normal. The right ventricular  size is normal.   3. No left atrial/left atrial appendage thrombus was detected.   4. The mitral valve is normal in structure. No evidence of mitral valve  regurgitation.   5. The aortic valve is normal in structure. Aortic valve regurgitation is  not visualized.   6. TEE done to assess postioning of VV ECMO cannula. Cannula adequately  positioned with excellent return flow directed into RA.   Successful VV ECMO placement.   Antimicrobials:  Anti-infectives (From admission, onward)    Start     Dose/Rate Route Frequency Ordered Stop   02/08/22 2100  vancomycin (VANCOCIN) IVPB 1000 mg/200 mL premix  Status:  Discontinued        1,000 mg 200 mL/hr  over 60 Minutes Intravenous Every 12 hours 02/08/22 1333 02/10/22 0750   02/08/22 0830  meropenem (MERREM) 1 g in sodium chloride 0.9 % 100 mL IVPB  Status:  Discontinued        1 g 200 mL/hr over 30 Minutes Intravenous Every 8 hours 02/08/22 0742 02/11/22 0819   02/08/22 0830  vancomycin (VANCOCIN) 2,000 mg in sodium chloride 0.9 % 500 mL IVPB        2,000 mg 260 mL/hr over 120 Minutes Intravenous  Once 02/08/22 0742 02/08/22 1052       Subjective: No new complaints  Objective: Vitals:   02/24/22 0500 02/24/22 0537 02/24/22 1207 02/24/22 1646  BP:  125/84 (!) 134/95 (!) 132/94  Pulse:  90 90 (!) 104  Resp:  19 20 20  $ Temp:  98.5 F (36.9 C) 98.4 F (36.9 C) 98.1 F (36.7 C)  TempSrc:  Oral Oral Axillary  SpO2:  98% 98% 100%  Weight: 71.6 kg     Height:        Intake/Output Summary (Last 24 hours) at 02/24/2022 1801 Last data filed at 02/24/2022 0830 Gross per 24 hour  Intake 360 ml  Output 250 ml  Net 110 ml   Filed Weights   02/22/22 0500 02/23/22 0353 02/24/22 0500  Weight: 71.8 kg 71.5 kg 71.6 kg    Examination:  General:  No acute distress. Lungs: unlabored Neurological: Alert and oriented 3. Moves all extremities 4 with equal strength. Cranial nerves II through XII grossly intact. Extremities: no LEE   Data Reviewed: I have personally reviewed following labs and imaging studies  CBC: Recent Labs  Lab 02/19/22 0053 02/20/22 0031 02/21/22 0250 02/23/22 0355 02/24/22 0307  WBC 23.4* 23.3* 17.2* 15.7* 15.2*  NEUTROABS  --   --  12.4*  --  10.9*  HGB 9.7* 10.6* 9.7* 10.4* 10.4*  HCT 29.4* 32.3* 29.3* 32.1* 31.6*  MCV 83.8 83.5 83.0 84.9 84.5  PLT 255 267 242 286 [id number]    Basic Metabolic Panel: Recent Labs  Lab 02/21/22 0250 02/22/22 0248 02/23/22 0355 02/24/22 0307  NA 134* 136 135 137  K 3.9 4.0 3.8 4.7  CL 99 101 100 106  CO2 27 26 25 $ 17*  GLUCOSE 119* 161* 160* 106*  BUN 19 13 18 17  $ CREATININE 0.53 0.51 0.51 0.52  CALCIUM 8.6* 8.9  9.0 9.1  MG 1.9 1.8 1.8  --   PHOS 4.9* 4.6 4.6  --     GFR: Estimated Creatinine Clearance: 94.9 mL/min (by C-G formula based on SCr of 0.52 mg/dL).  Liver Function Tests: Recent Labs  Lab 02/18/22 1454 02/21/22 0250 02/22/22 0248 02/23/22 0355 02/24/22 0307  AST 41 31 31  --  26  ALT 124* 83* 94*  --  83*  ALKPHOS 48 44 51  --  51  BILITOT 0.3 <0.1* 0.3  --  0.6  PROT 5.7* 5.4* 6.0*  --  6.2*  ALBUMIN 2.8* 2.7* 2.9* 3.2* 3.1*    CBG: Recent Labs  Lab 02/23/22 2354 02/24/22 0535 02/24/22 0843 02/24/22 1133 02/24/22 1602  GLUCAP 106* 101* 111* 112* 107*     Recent Results (from the past 240 hour(s))  Culture, Urine (Do not remove urinary catheter, catheter placed by urology or difficult to place)     Status: None   Collection Time: 02/17/22 12:05 PM   Specimen: Urine, Catheterized  Result Value Ref Range Status   Specimen Description URINE, CATHETERIZED  Final   Special Requests NONE  Final   Culture   Final    NO GROWTH Performed at [facility], [place] [address], [place], [place] [address]    Report Status 02/18/2022 FINAL  Final  Culture, blood (Routine X 2) w Reflex to ID Panel     Status: None   Collection Time: 02/18/22  2:57 PM   Specimen: BLOOD  Result Value Ref Range Status   Specimen Description BLOOD BLOOD LEFT HAND  Final   Special Requests   Final    BOTTLES DRAWN AEROBIC ONLY Blood Culture adequate volume   Culture   Final    NO GROWTH 5 DAYS Performed at [facility], [place] [address], [place], [place] [address]    Report Status 02/23/2022 FINAL  Final  Culture, blood (Routine X 2) w Reflex to ID Panel     Status: None   Collection Time: 02/18/22  2:57 PM   Specimen: BLOOD  Result Value Ref Range Status   Specimen Description BLOOD BLOOD RIGHT HAND  Final   Special Requests   Final    BOTTLES DRAWN AEROBIC ONLY Blood Culture adequate volume   Culture   Final    NO GROWTH 5 DAYS Performed at [facility], [place]  [address] [place], [place] [address]    Report Status 02/23/2022 FINAL  Final  Radiology Studies: No results found.      Scheduled Meds:  diltiazem  60 mg Oral Q6H   enoxaparin (LOVENOX) injection  40 mg Subcutaneous Daily   feeding supplement  237 mL Oral BID BM   insulin aspart  0-5 Units Subcutaneous QHS   insulin aspart  0-9 Units Subcutaneous TID WC   loratadine  10 mg Oral Daily   mometasone-formoterol  2 puff Inhalation BID   montelukast  10 mg Oral QHS   multivitamin with minerals  1 tablet Oral Daily   mouth rinse  15 mL Mouth Rinse 4 times per day   senna-docusate  1 tablet Oral Daily   triamcinolone ointment   Topical BID   umeclidinium bromide  1 puff Inhalation Daily   Continuous Infusions:  sodium chloride 500 mL (02/15/22 0246)   sodium chloride Stopped (02/17/22 1900)     LOS: 17 days    Time spent: over 30 min    Fayrene Helper, MD [facility]   To contact the attending provider between 7A-7P or the covering provider during after hours 7P-7A, please log into the web site [email or web address] and access using universal [place] password for that web site. If you do not have the password, please call the hospital operator.  02/24/2022, 6:01 PM

## 2022-02-25 ENCOUNTER — Inpatient Hospital Stay (HOSPITAL_COMMUNITY)
Admission: RE | Admit: 2022-02-25 | Discharge: 2022-03-01 | DRG: 946 | Disposition: A | Discharge status: Home / Self Care | Payer: BC Managed Care – PPO | Source: Intra-hospital | Attending: Physical Medicine and Rehabilitation | Admitting: Physical Medicine and Rehabilitation

## 2022-02-25 ENCOUNTER — Other Ambulatory Visit: Payer: Self-pay

## 2022-02-25 ENCOUNTER — Encounter (HOSPITAL_COMMUNITY): Payer: Self-pay | Admitting: Physical Medicine and Rehabilitation

## 2022-02-25 DIAGNOSIS — Z888 Allergy status to other drugs, medicaments and biological substances status: Secondary | ICD-10-CM | POA: Diagnosis not present

## 2022-02-25 DIAGNOSIS — Z91013 Allergy to seafood: Secondary | ICD-10-CM

## 2022-02-25 DIAGNOSIS — R5381 Other malaise: Secondary | ICD-10-CM | POA: Diagnosis not present

## 2022-02-25 DIAGNOSIS — J45909 Unspecified asthma, uncomplicated: Secondary | ICD-10-CM | POA: Diagnosis present

## 2022-02-25 DIAGNOSIS — R000 Tachycardia, unspecified: Secondary | ICD-10-CM | POA: Diagnosis not present

## 2022-02-25 DIAGNOSIS — Z79899 Other long term (current) drug therapy: Secondary | ICD-10-CM | POA: Diagnosis not present

## 2022-02-25 DIAGNOSIS — J9600 Acute respiratory failure, unspecified whether with hypoxia or hypercapnia: Secondary | ICD-10-CM | POA: Diagnosis not present

## 2022-02-25 DIAGNOSIS — D509 Iron deficiency anemia, unspecified: Secondary | ICD-10-CM | POA: Diagnosis not present

## 2022-02-25 DIAGNOSIS — R7303 Prediabetes: Secondary | ICD-10-CM | POA: Diagnosis not present

## 2022-02-25 DIAGNOSIS — F515 Nightmare disorder: Secondary | ICD-10-CM | POA: Diagnosis present

## 2022-02-25 DIAGNOSIS — J9690 Respiratory failure, unspecified, unspecified whether with hypoxia or hypercapnia: Secondary | ICD-10-CM | POA: Diagnosis present

## 2022-02-25 DIAGNOSIS — R0603 Acute respiratory distress: Secondary | ICD-10-CM | POA: Diagnosis not present

## 2022-02-25 DIAGNOSIS — J45901 Unspecified asthma with (acute) exacerbation: Secondary | ICD-10-CM

## 2022-02-25 DIAGNOSIS — Z91018 Allergy to other foods: Secondary | ICD-10-CM

## 2022-02-25 DIAGNOSIS — Z8619 Personal history of other infectious and parasitic diseases: Secondary | ICD-10-CM | POA: Diagnosis not present

## 2022-02-25 DIAGNOSIS — J9601 Acute respiratory failure with hypoxia: Secondary | ICD-10-CM

## 2022-02-25 LAB — GLUCOSE, CAPILLARY
Glucose-Capillary: 103 mg/dL — ABNORMAL HIGH (ref 70–99)
Glucose-Capillary: 121 mg/dL — ABNORMAL HIGH (ref 70–99)
Glucose-Capillary: 151 mg/dL — ABNORMAL HIGH (ref 70–99)
Glucose-Capillary: 106 mg/dL — ABNORMAL HIGH (ref 70–99)

## 2022-02-25 MED ORDER — LEVALBUTEROL HCL 0.63 MG/3ML IN NEBU: 0.6300 mg | INHALATION_SOLUTION | Freq: Four times a day (QID) | RESPIRATORY_TRACT | Status: DC | PRN

## 2022-02-25 MED ORDER — ORAL CARE MOUTH RINSE
15.0000 mL | OROMUCOSAL | Status: DC
  Administered 2022-02-25 – 2022-03-01 (×13): 15 mL via OROMUCOSAL

## 2022-02-25 MED ORDER — METHOCARBAMOL 500 MG PO TABS: 500.0000 mg | ORAL_TABLET | Freq: Four times a day (QID) | ORAL | Status: DC | PRN

## 2022-02-25 MED ORDER — FERROUS SULFATE 325 (65 FE) MG PO TBEC: 325.0000 mg | DELAYED_RELEASE_TABLET | ORAL | 11 refills | Status: DC

## 2022-02-25 MED ORDER — MELATONIN 5 MG PO TABS: 5.0000 mg | ORAL_TABLET | Freq: Every evening | ORAL | Status: DC | PRN

## 2022-02-25 MED ORDER — LORATADINE 10 MG PO TABS
10.0000 mg | ORAL_TABLET | Freq: Every day | ORAL | Status: DC
  Administered 2022-02-26 – 2022-03-01 (×4): 10 mg via ORAL
  Filled 2022-02-25 (×4): qty 1

## 2022-02-25 MED ORDER — LORATADINE 10 MG PO TABS: 10.0000 mg | ORAL_TABLET | Freq: Every day | ORAL | Status: DC

## 2022-02-25 MED ORDER — MONTELUKAST SODIUM 10 MG PO TABS: 10.0000 mg | ORAL_TABLET | Freq: Every day | ORAL | Status: DC

## 2022-02-25 MED ORDER — DILTIAZEM HCL 60 MG PO TABS
60.0000 mg | ORAL_TABLET | Freq: Four times a day (QID) | ORAL | Status: DC
  Administered 2022-02-25 – 2022-03-01 (×15): 60 mg via ORAL
  Filled 2022-02-25 (×16): qty 1

## 2022-02-25 MED ORDER — DILTIAZEM HCL 60 MG PO TABS: 60.0000 mg | ORAL_TABLET | Freq: Four times a day (QID) | ORAL | Status: DC

## 2022-02-25 MED ORDER — MAGNESIUM CITRATE PO SOLN: 1.0000 | Freq: Once | ORAL | Status: DC | PRN

## 2022-02-25 MED ORDER — PROCHLORPERAZINE EDISYLATE 10 MG/2ML IJ SOLN: 5.0000 mg | Freq: Four times a day (QID) | INTRAMUSCULAR | Status: DC | PRN

## 2022-02-25 MED ORDER — DIPHENHYDRAMINE HCL 12.5 MG/5ML PO ELIX: 12.5000 mg | ORAL_SOLUTION | Freq: Four times a day (QID) | ORAL | Status: DC | PRN

## 2022-02-25 MED ORDER — POLYETHYLENE GLYCOL 3350 17 G PO PACK: 17.0000 g | PACK | Freq: Every day | ORAL | Status: DC | PRN

## 2022-02-25 MED ORDER — ENOXAPARIN SODIUM 40 MG/0.4ML IJ SOSY
40.0000 mg | PREFILLED_SYRINGE | Freq: Every day | INTRAMUSCULAR | Status: DC
  Administered 2022-02-26 – 2022-03-01 (×4): 40 mg via SUBCUTANEOUS
  Filled 2022-02-25 (×4): qty 0.4

## 2022-02-25 MED ORDER — MOMETASONE FURO-FORMOTEROL FUM 200-5 MCG/ACT IN AERO
2.0000 | INHALATION_SPRAY | Freq: Two times a day (BID) | RESPIRATORY_TRACT | Status: DC
  Administered 2022-02-25 – 2022-03-01 (×8): 2 via RESPIRATORY_TRACT
  Filled 2022-02-25: qty 8.8

## 2022-02-25 MED ORDER — TRIAMCINOLONE ACETONIDE 0.1 % EX OINT
TOPICAL_OINTMENT | Freq: Two times a day (BID) | CUTANEOUS | Status: DC
  Filled 2022-02-25: qty 15

## 2022-02-25 MED ORDER — UMECLIDINIUM BROMIDE 62.5 MCG/ACT IN AEPB
1.0000 | INHALATION_SPRAY | Freq: Every day | RESPIRATORY_TRACT | Status: DC
  Administered 2022-02-26 – 2022-03-01 (×4): 1 via RESPIRATORY_TRACT
  Filled 2022-02-25: qty 7

## 2022-02-25 MED ORDER — SORBITOL 70 % SOLN: 30.0000 mL | Freq: Every day | Status: DC | PRN

## 2022-02-25 MED ORDER — TRAZODONE HCL 50 MG PO TABS: 25.0000 mg | ORAL_TABLET | Freq: Every evening | ORAL | Status: DC | PRN

## 2022-02-25 MED ORDER — MONTELUKAST SODIUM 10 MG PO TABS
10.0000 mg | ORAL_TABLET | Freq: Every day | ORAL | Status: DC
  Administered 2022-02-25 – 2022-02-28 (×4): 10 mg via ORAL
  Filled 2022-02-25 (×4): qty 1

## 2022-02-25 MED ORDER — ENOXAPARIN SODIUM 40 MG/0.4ML IJ SOSY: 40.0000 mg | PREFILLED_SYRINGE | INTRAMUSCULAR | Status: DC

## 2022-02-25 MED ORDER — MOMETASONE FURO-FORMOTEROL FUM 200-5 MCG/ACT IN AERO: 2.0000 | INHALATION_SPRAY | Freq: Two times a day (BID) | RESPIRATORY_TRACT | Status: DC

## 2022-02-25 MED ORDER — PROCHLORPERAZINE 25 MG RE SUPP: 12.5000 mg | Freq: Four times a day (QID) | RECTAL | Status: DC | PRN

## 2022-02-25 MED ORDER — UMECLIDINIUM BROMIDE 62.5 MCG/ACT IN AEPB: 1.0000 | INHALATION_SPRAY | Freq: Every day | RESPIRATORY_TRACT | Status: DC

## 2022-02-25 MED ORDER — ACETAMINOPHEN 325 MG PO TABS: 325.0000 mg | ORAL_TABLET | ORAL | Status: DC | PRN

## 2022-02-25 MED ORDER — PROCHLORPERAZINE MALEATE 5 MG PO TABS: 5.0000 mg | ORAL_TABLET | Freq: Four times a day (QID) | ORAL | Status: DC | PRN

## 2022-02-25 MED ORDER — ADULT MULTIVITAMIN W/MINERALS CH
1.0000 | ORAL_TABLET | Freq: Every day | ORAL | Status: DC
  Administered 2022-02-26 – 2022-03-01 (×4): 1 via ORAL
  Filled 2022-02-25 (×4): qty 1

## 2022-02-25 MED ORDER — GUAIFENESIN-DM 100-10 MG/5ML PO SYRP: 5.0000 mL | ORAL_SOLUTION | Freq: Four times a day (QID) | ORAL | Status: DC | PRN

## 2022-02-25 MED ORDER — ALUM & MAG HYDROXIDE-SIMETH 200-200-20 MG/5ML PO SUSP: 30.0000 mL | ORAL | Status: DC | PRN

## 2022-02-25 MED ORDER — ENSURE ENLIVE PO LIQD
237.0000 mL | Freq: Two times a day (BID) | ORAL | Status: DC
  Administered 2022-02-26 – 2022-02-28 (×4): 237 mL via ORAL

## 2022-02-25 NOTE — Progress Notes (Signed)
Mobility Specialist Progress Note:   02/25/22 1350  Mobility  Activity Ambulated with assistance in hallway  Level of Assistance Contact guard assist, steadying assist  Assistive Device None  Distance Ambulated (ft) 400 ft  Activity Response Tolerated well  Mobility Referral Yes  $Mobility charge 1 Mobility   Pt eager for mobility session. Required minG assist for safety. Pt asx throughout ambulation, no unsteadiness noted. Pt back in bed with all needs met, eager for d/c to CIR.  Nelta Numbers Mobility Specialist Please contact via SecureChat or  Rehab office at [phone]

## 2022-02-25 NOTE — TOC Progression Note (Signed)
Transition of Care [facility]) - Progression Note    Patient Details  Name: Kaitlyn Mcneil MRN: [id number] Date of Birth: January 27, 1995  [facility] of [facility]) CM/SW Contact  Graves-Bigelow, Ocie Cornfield, RN Phone Number: 02/25/2022, 11:31 AM  Clinical Narrative: CIR is following the patient; awaiting insurance authorization. Case Manager reached out to the inpatient rehab coordinator regarding consult for PCP needs. If the insurance approves CIR; they will follow for PCP post hospitalization. Case Manager continue to follow for additional needs.   Expected Discharge Plan: [place] Barriers to Discharge: Continued Medical Work up  Expected Discharge Plan and Services In-house Referral: NA Discharge Planning Services: CM Consult Post Acute Care Choice: IP Rehab Living arrangements for the past 2 months: Apartment                   DME Agency: NA  Social Determinants of Health (SDOH) Interventions SDOH Screenings   Food Insecurity: No Food Insecurity (02/24/2022)  Housing: Low Risk  (02/24/2022)  Transportation Needs: No Transportation Needs (02/24/2022)  Utilities: Not At Risk (02/24/2022)  Tobacco Use: Low Risk  (02/10/2022)    Readmission Risk Interventions     No data to display

## 2022-02-25 NOTE — Progress Notes (Signed)
Inpatient Rehab Admissions Coordinator:    I have insurance approval and a bed available for pt to admit to CIR today. Dr. Florene Glen in agreement.  Will let pt/family and TOC team know.   Shann Medal, PT, DPT Admissions Coordinator [phone] 02/25/22  12:58 PM

## 2022-02-25 NOTE — Progress Notes (Addendum)
Inpatient Rehabilitation Admission Medication Review by a Pharmacist  A complete drug regimen review was completed for this patient to identify any potential clinically significant medication issues.  High Risk Drug Classes Is patient taking? Indication by Medication  Antipsychotic Yes Compazine - N/v  Anticoagulant Yes Lovenox - DVT px  Antibiotic No   Opioid No   Antiplatelet No   Hypoglycemics/insulin No   Vasoactive Medication Yes Diltiazem - sinus tachycardia   Chemotherapy No   Other Yes Xopenex/Dulera/Incruse - asthma Claritin/singulair - allergies Melatonin - sleep Triamcinolone - eczema Robaxin - spasms     Type of Medication Issue Identified Description of Issue Recommendation(s)  Drug Interaction(s) (clinically significant)     Duplicate Therapy     Allergy     No Medication Administration End Date     Incorrect Dose     Additional Drug Therapy Needed     Significant med changes from prior encounter (inform family/care partners about these prior to discharge).    Other       Clinically significant medication issues were identified that warrant physician communication and completion of prescribed/recommended actions by midnight of the next day:  No  Name of provider notified for urgent issues identified:   Provider Method of Notification:     Pharmacist comments:   Time spent performing this drug regimen review (minutes):  [place], PharmD, [place], AAHIVP, CPP Infectious Disease Pharmacist 02/25/2022 5:40 PM

## 2022-02-25 NOTE — Discharge Summary (Signed)
Physician Discharge Summary  Kaitlyn Mcneil [place] [id number] DOB: November 27, 1995 DOA: 02/07/2022  PCP: Pcp, No  Admit date: 02/07/2022 Discharge date: 02/25/2022  Time spent: 40 minutes  Recommendations for Outpatient Follow-up:  Follow outpatient CBC/CMP  Needs pulm follow outpatient Follow diltiazem, wean this as able based on HR Follow elevated WBC in CIR/outpatient - heme f/u if persistent Follow elevated LFT's in CIR/outpatient - additional w/u if not improving   Discharge Diagnoses:  Principal Problem:   Acute respiratory distress Active Problems:   Severe persistent asthma with status asthmaticus   Acute respiratory failure with hypoxia and hypercapnia (HCC)   Acute metabolic encephalopathy   Severe sepsis (HCC)   Sinus tachycardia   Delirium   Generalized weakness   Obesity (BMI 30-39.9)   Lactic acidosis   Discharge Condition: stable  Diet recommendation: heart healthy  Filed Weights   02/23/22 0353 02/24/22 0500 02/25/22 0505  Weight: 71.5 kg 71.6 kg 70.9 kg    History of present illness:  27 yo F with PMH of asthma, allergic rhinitis, obesity and eczema who presented 2/5 with acute dyspnea that did not improve with home nebulizers and rescue inhalers.  She had Kaitlyn Mcneil fever, cough and URI symptoms.  She had sick contacts.   In the ED, she was started on BiPAP, steroid and nebulizers.  However, she continued to have remarkably increased work of breathing and fatigue for which she was intubated.  She required Kaitlyn Mcneil great deal of sedation and neuromuscular blockade to achieve adequate minute ventilation.  She was placed on ECMO for 48 hours on 2/6 and decannulated 2/8.  Continued an asthma treatment course on ventilator and began weaning off sedation.  She required Precedex and clonidine for agitation.  Eventually, successfully extubated on 2/15 but remained remains delirious with agitation and tachycardia.  Eventually, she came off Precedex, and transferred to [facility]  service on 2/18.   Patient is currently on room air.  Delirium resolved.  She was on full liquid diet as well as tube feed by cortrack.  Diet advanced to regular by SLP.  Tube feed changed to nocturnal tube improve appetite.   Tube feed now d/c'd.  Recovering well, plan for CIR 2/23.  Hospital Course:  Assessment and Plan:  Severe sepsis due to rhinovirus infection: POA.  Had tachycardia, tachypnea, respiratory failure with lactic acidosis on presentation.  Sepsis physiology resolving.   She was on meropenem 2/6-2/9 and vancomycin 2/6-2/8. Extensive infectious workup as above.  -Continues monitoring leukocytosis-improving. -Management as below   Acute respiratory failure with hypoxia and hypercapnia due to status asthmaticus, severe persistent allergic asthma.  RVP positive for rhinovirus.  Improved.  Respiratory failure seems to have resolved. -Intubation and mechanical ventilation from 2/5-2/15 -ECMO from 2/6-2/8 -Completed steroid course -Continue Dulera and Incruse.  Xopenex as needed. -Continue antihistamine and Singulair on discharge. -Continue pulmonary hygiene -Pulmonology arranging outpatient follow-up. -PPI if she develops GERD symptoms.   Elevated ALT: Mild and stable.  Could be due to viral illness.  CK within normal. -Continue monitoring -Consider workup if worse - monitor in CIR   Anemia of critical illness: Stable.  Anemia panel with mild iron deficiency. Follow outpatient    Physical deconditioning/fall in the hospital: Likely due to acute illness.  Patient had Kaitlyn Mcneil fall on 2/19 while in the bathroom and assisted down to the ground.  Did not strike her head or loss conscious.  No trauma or injury.  No focal neurodeficit -OOB, PT/OT-recommended CIR. -Fall precaution.   Oropharyngeal  dysphagia: Likely due to prolonged illness and intubation.  Resolved. -Advanced to regular diet by SLP -cortrak removed today   Acute metabolic encephalopathy-multifactorial due to  sedating medications, high-dose steroids.  Resolved.  -Weaned off oxycodone, Klonopin and clonidine.   Sinus tachycardia: Resolved. -Wean of clonidine today -Start weaning off Cardizem.  Decrease Cardizem to 60 mg 4 times daily.  Will continue to taper as tolerated - follow in CIR for continued taper.   Hyperglycemia: Likely steroid induced.  Hgb 5.9%. -Continue CBG monitoring and SSI.  Changed to ACHS.   Leukocytosis-no obvious source: Demargination?  Seems like she has been off steroid for quite some time now.  Extensive infectious workup unrevealing.  Resolving. -Continue monitoring -follow in CIR -continue to trend, consider heme c/s or referral if persistent   Indwelling Foley catheter Removed 2/20    Obesity/inadequate oral intake Body mass index is 30.53 kg/m.  Eczema Triamcinolone      Procedures:  Echo IMPRESSIONS     1. Left ventricular ejection fraction, by estimation, is 70 to 75%. The  left ventricle has normal function.   2. Right ventricular systolic function is normal. The right ventricular  size is normal.   3. No left atrial/left atrial appendage thrombus was detected.   4. The mitral valve is normal in structure. Trivial mitral valve  regurgitation.   5. The aortic valve is normal in structure. Aortic valve regurgitation is  not visualized.   6. VV ECMO cannula well positioned with return flow directed optimally  into RA.    IMPRESSIONS     1. Left ventricular ejection fraction, by estimation, is 70 to 75%. The  left ventricle has hyperdynamic function.   2. Right ventricular systolic function is normal. The right ventricular  size is normal.   3. No left atrial/left atrial appendage thrombus was detected.   4. The mitral valve is normal in structure. No evidence of mitral valve  regurgitation.   5. The aortic valve is normal in structure. Aortic valve regurgitation is  not visualized.   6. TEE done to assess postioning of VV ECMO cannula.  Cannula adequately  positioned with excellent return flow directed into RA.    Successful VV ECMO placement.   Consultations: PCCM cards  Discharge Exam: Vitals:   02/25/22 0908 02/25/22 1254  BP:  124/78  Pulse:    Resp:    Temp:    SpO2: 98%    No complaints Some eczema issues  General: No acute distress. Lungs: unlabored Neurological: Alert and oriented 3. Moves all extremities 4 with equal strength. Cranial nerves II through XII grossly intact. Skin: papular rash to R arm Extremities: No clubbing or cyanosis. No edema.  Discharge Instructions   Discharge Instructions     Call MD for:  difficulty breathing, headache or visual disturbances   Complete by: As directed    Call MD for:  extreme fatigue   Complete by: As directed    Call MD for:  hives   Complete by: As directed    Call MD for:  persistant dizziness or light-headedness   Complete by: As directed    Call MD for:  persistant nausea and vomiting   Complete by: As directed    Call MD for:  redness, tenderness, or signs of infection (pain, swelling, redness, odor or green/yellow discharge around incision site)   Complete by: As directed    Call MD for:  severe uncontrolled pain   Complete by: As directed  Call MD for:  temperature >100.4   Complete by: As directed    Diet - low sodium heart healthy   Complete by: As directed    Discharge instructions   Complete by: As directed    You were seen for Mekiah [place] severe asthma exacerbation.  You had Al[facility] [place] complex hospital course including intubation and ecmo.  Thankfully, you've improved.  Continue your albuterol at discharge as needed.  We'll send you with dulera, incruse ellipta as controller meds.  You'll also discharge on claritin and singulair.  You were started on diltiazem.  This can be weaned as tolerated over your time in rehab.  Your white blood cells are high, this should be followed intermittently in CIR.  If it's persistent, you may need to see  pulmonology.  You have follow up with pulmonology scheduled in March.  This follow up appointment is extremely important.  Please establish and follow up with Towanna Avery primary care doctor as well.  Return for new, recurrent, or worsening symptoms.  Please ask your PCP to request records from this hospitalization so they know what was done and what the next steps will be.   Increase activity slowly   Complete by: As directed       Allergies as of 02/25/2022       Reactions   Beta Adrenergic Blockers Shortness Of Breath   Severe wheezing   Shellfish Allergy Swelling   Throat, facial swelling   Tomato Itching, Other (See Comments)   Eczema flares        Medication List     TAKE these medications    albuterol 108 (90 Base) MCG/ACT inhaler Commonly known as: VENTOLIN HFA Inhale 1-2 puffs into the lungs every 6 (six) hours as needed for wheezing or shortness of breath.   ALBUTEROL IN Inhale 1 vial into the lungs every 6 (six) hours as needed (wheezing, shortness of breath).   diltiazem 60 MG tablet Commonly known as: CARDIZEM Take 1 tablet (60 mg total) by mouth every 6 (six) hours.   loratadine 10 MG tablet Commonly known as: CLARITIN Take 1 tablet (10 mg total) by mouth daily. Start taking on: February 26, 2022   mometasone-formoterol 200-5 MCG/ACT Aero Commonly known as: DULERA Inhale 2 puffs into the lungs 2 (two) times daily.   montelukast 10 MG tablet Commonly known as: SINGULAIR Take 1 tablet (10 mg total) by mouth at bedtime.   triamcinolone ointment 0.1 % Commonly known as: KENALOG Apply 1 Application topically daily as needed (eczema flares).   umeclidinium bromide 62.5 MCG/ACT Aepb Commonly known as: INCRUSE ELLIPTA Inhale 1 puff into the lungs daily. Start taking on: February 26, 2022       Allergies  Allergen Reactions   Beta Adrenergic Blockers Shortness Of Breath    Severe wheezing   Shellfish Allergy Swelling    Throat, facial swelling    Tomato Itching and Other (See Comments)    Eczema flares    Follow-up Information     Juanito Doom, MD Follow up.   Specialty: Pulmonary Disease Why: You have follow up scheduled at 3:30 PM on March 11th with pulmonology Contact information: [place] [id number] [phone]                  The results of significant diagnostics from this hospitalization (including imaging, microbiology, ancillary and laboratory) are listed below for reference.    Significant Diagnostic Studies: ECHO TEE  Result Date: 02/16/2022  TRANSESOPHOGEAL ECHO REPORT   Patient Name:   BRYONY ROUGIER Date of Exam: 02/08/2022 Medical Rec #:  [id number]           Height:       60.0 in Accession #:    [id number]          Weight:       167.3 lb Date of Birth:  1995-01-16           BSA:          1.730 m Patient Age:    26 years            BP:           154/65 mmHg Patient Gender: F                   HR:           120 bpm. Exam Location:  Inpatient Procedure: Transesophageal Echo and Color Doppler Indications:     Acute Respiratory Distress  History:         Patient has prior history of Echocardiogram examinations, most                  recent 02/08/2022.  Sonographer:     Raquel Sarna Senior RDCS Referring Phys:  [id number] Shaune Pascal BENSIMHON Diagnosing Phys: Glori Bickers MD  Sonographer Comments: Bedside TEE to check ECMO cannula position PROCEDURE: After discussion of the risks and benefits of [place] TEE, an informed consent was obtained from the patient. The transesophogeal probe was passed without difficulty through the esophogus of the patient. Sedation performed by performing physician. The patient was monitored while under deep sedation. The patient developed no complications during the procedure.  IMPRESSIONS  1. Left ventricular ejection fraction, by estimation, is 70 to 75%. The left ventricle has normal function.  2. Right ventricular systolic function is normal. The right ventricular size  is normal.  3. No left atrial/left atrial appendage thrombus was detected.  4. The mitral valve is normal in structure. Trivial mitral valve regurgitation.  5. The aortic valve is normal in structure. Aortic valve regurgitation is not visualized.  6. VV ECMO cannula well positioned with return flow directed optimally into RA. FINDINGS  Left Ventricle: Left ventricular ejection fraction, by estimation, is 70 to 75%. The left ventricle has normal function. The left ventricular internal cavity size was small. Right Ventricle: The right ventricular size is normal. No increase in right ventricular wall thickness. Right ventricular systolic function is normal. Left Atrium: Left atrial size was normal in size. No left atrial/left atrial appendage thrombus was detected. Right Atrium: Right atrial size was normal in size. Pericardium: There is no evidence of pericardial effusion. Mitral Valve: The mitral valve is normal in structure. Trivial mitral valve regurgitation. Tricuspid Valve: The tricuspid valve is normal in structure. Tricuspid valve regurgitation is mild. Aortic Valve: The aortic valve is normal in structure. Aortic valve regurgitation is not visualized. Pulmonic Valve: The pulmonic valve was not well visualized. Pulmonic valve regurgitation is not visualized. Aorta: The aortic root is normal in size and structure. IAS/Shunts: No atrial level shunt detected by color flow Doppler. Glori Bickers MD Electronically signed by Glori Bickers MD Signature Date/Time: 02/16/2022/7:42:45 PM    Final    ECHO TEE  Result Date: 02/16/2022    TRANSESOPHOGEAL ECHO REPORT   Patient Name:   TOSHUA BORSA [facility] Date of Exam: 02/08/2022 Medical Rec #:  [id number]  Height:       60.0 in Accession #:    [id number]          Weight:       156.7 lb Date of Birth:  [birth year]           BSA:          1.683 m Patient Age:    26 years            BP:           97/70 mmHg Patient Gender: F                   HR:           133  bpm. Exam Location:  Inpatient Procedure: Transesophageal Echo and Color Doppler STAT ECHO Indications:     ECMO placement  History:         Patient has no prior history of Echocardiogram examinations.                  Asthma.  Sonographer:     Johny Chess RDCS Referring Phys:  [id number] Shaune Pascal BENSIMHON Diagnosing Phys: Glori Bickers MD PROCEDURE: The patient was intubated. The transesophogeal probe was passed without difficulty through the esophogus of the patient. Sedation performed by different physician. The patient was monitored while under deep sedation. The patient developed no complications during the procedure.  IMPRESSIONS  1. Left ventricular ejection fraction, by estimation, is 70 to 75%. The left ventricle has hyperdynamic function.  2. Right ventricular systolic function is normal. The right ventricular size is normal.  3. No left atrial/left atrial appendage thrombus was detected.  4. The mitral valve is normal in structure. No evidence of mitral valve regurgitation.  5. The aortic valve is normal in structure. Aortic valve regurgitation is not visualized.  6. TEE done to assess postioning of VV ECMO cannula. Cannula adequately positioned with excellent return flow directed into RA. FINDINGS  Left Ventricle: Left ventricular ejection fraction, by estimation, is 70 to 75%. The left ventricle has hyperdynamic function. The left ventricular internal cavity size was small. Right Ventricle: The right ventricular size is normal. No increase in right ventricular wall thickness. Right ventricular systolic function is normal. Left Atrium: Left atrial size was normal in size. No left atrial/left atrial appendage thrombus was detected. Right Atrium: Right atrial size was normal in size. Pericardium: There is no evidence of pericardial effusion. Mitral Valve: The mitral valve is normal in structure. No evidence of mitral valve regurgitation. Tricuspid Valve: The tricuspid valve is normal in structure.  Tricuspid valve regurgitation is trivial. Aortic Valve: The aortic valve is normal in structure. Aortic valve regurgitation is not visualized. Pulmonic Valve: The pulmonic valve was normal in structure. Pulmonic valve regurgitation is not visualized. Aorta: The aortic root was not well visualized. IAS/Shunts: No atrial level shunt detected by color flow Doppler. Glori Bickers MD Electronically signed by Glori Bickers MD Signature Date/Time: 02/16/2022/7:38:46 PM    Final    CT CHEST WO CONTRAST  Result Date: 02/11/2022 CLINICAL DATA:  Pneumonia, complication suspected shortness of breath. EXAM: CT CHEST WITHOUT CONTRAST TECHNIQUE: Multidetector CT imaging of the chest was performed following the standard protocol without IV contrast. RADIATION DOSE REDUCTION: This exam was performed according to the departmental dose-optimization program which includes automated exposure control, adjustment of the mA and/or kV according to patient size and/or use of iterative reconstruction technique. COMPARISON:  Chest radiograph dated February 11, 2022 FINDINGS: Cardiovascular: No  significant vascular findings. Normal heart size. No pericardial effusion. Mediastinum/Nodes: No enlarged mediastinal or axillary lymph nodes. Endotracheal tube in place. Feeding tube coursing below the diaphragm projecting over the stomach thyroid gland and esophagus demonstrate no significant findings. Left access central venous catheter with distal tip in the SVC. Lungs/Pleura: Bilateral lower lobe opacities suggesting bibasilar pneumonia, left greater than the right. Small bilateral pleural effusions. Upper Abdomen: No acute abnormality. Musculoskeletal: No chest wall mass or suspicious bone lesions identified. IMPRESSION: 1. Bilateral lower lobe opacities suggesting bibasilar pneumonia, left greater than the right. Small bilateral pleural effusions. 2. Endotracheal tube in place. Feeding tube coursing below the diaphragm projecting over the  stomach. Electronically Signed   By: Keane Police D.O.   On: 02/11/2022 14:42   CT HEAD WO CONTRAST (5MM)  Result Date: 02/11/2022 CLINICAL DATA:  Stroke, hemorrhagic. EXAM: CT HEAD WITHOUT CONTRAST TECHNIQUE: Contiguous axial images were obtained from the base of the skull through the vertex without intravenous contrast. RADIATION DOSE REDUCTION: This exam was performed according to the departmental dose-optimization program which includes automated exposure control, adjustment of the mA and/or kV according to patient size and/or use of iterative reconstruction technique. COMPARISON:  None Available. FINDINGS: Brain: No evidence of acute infarction, hemorrhage, hydrocephalus, extra-axial collection or mass lesion/mass effect. Vascular: No hyperdense vessel or unexpected calcification. Skull: Normal. Negative for fracture or focal lesion. Sinuses/Orbits: Fluid level throughout the paranasal sinuses. May be related to endotracheal tube. Other: None. IMPRESSION: No acute intracranial abnormality. Electronically Signed   By: Pedro Earls M.D.   On: 02/11/2022 14:42   DG CHEST PORT 1 VIEW  Result Date: 02/11/2022 CLINICAL DATA:  Intubation EXAM: PORTABLE CHEST 1 VIEW COMPARISON:  02/10/2022 FINDINGS: Endotracheal tube, feeding tube, and central venous line unchanged. Stable cardiac silhouette. Interval improvement in LEFT basilar airspace disease and atelectasis. Mild improvement in bilateral pleural effusions. IMPRESSION: 1. Improvement in LEFT basilar airspace disease and atelectasis. 2. Mild improvement in bilateral pleural effusions. 3. Stable support apparatus. Electronically Signed   By: Suzy Bouchard M.D.   On: 02/11/2022 08:01   DG CHEST PORT 1 VIEW  Result Date: 02/10/2022 CLINICAL DATA:  Acute hypoxemic respiratory failure. EXAM: PORTABLE CHEST 1 VIEW COMPARISON:  Single-view of the chest 02/09/2018 for and 02/08/2022. FINDINGS: Eston Heslin new feeding tube is in place and courses below the  inferior margin of the film. Support tubes and lines are otherwise unchanged. Bibasilar airspace disease is worse on the left and has progressed since yesterday's exam. There are small bilateral pleural effusions. Heart size is normal. No pneumothorax. IMPRESSION: 1. New feeding tube courses below the inferior margin of the film. Support tubes and lines are otherwise unchanged. 2. Bibasilar airspace disease is worse on the left and has progressed since yesterday's exam. 3. Small bilateral pleural effusions. Electronically Signed   By: Inge Rise M.D.   On: 02/10/2022 08:15   DG Abd Portable 1V  Result Date: 02/09/2022 CLINICAL DATA:  Feeding tube placement EXAM: PORTABLE ABDOMEN - 1 VIEW COMPARISON:  02/08/2022 FINDINGS: Limited radiograph of the lower chest and upper abdomen was obtained for the purposes of enteric tube localization. New large bore enteric tube is seen coursing below the diaphragm with distal tip and terminating within the expected location of the distal gastric body. Previously seen enteric tube remains positioned within the stomach. Partially imaged ECMO cannula. IMPRESSION: New large bore enteric tube with distal tip and terminating within the expected location of the distal gastric body. Electronically  Signed   By: Davina Poke D.O.   On: 02/09/2022 12:08   DG CHEST PORT 1 VIEW  Result Date: 02/09/2022 CLINICAL DATA:  ECMO. EXAM: PORTABLE CHEST 1 VIEW COMPARISON:  02/08/2022 FINDINGS: Endotracheal tube is stable and measures 2.6 cm above the carina. Again noted is Misha Vanoverbeke right jugular ECMO catheter that extends into the IVC but the tip is not imaged. Negative for pneumothorax. Nasogastric tube extends into the abdomen but the tip is beyond the image. Persistent patchy densities in the left hilum, left lower lobe and medial right lung base. These lung findings are not significantly changed. Heart size is within normal limits and stable. Left arm PICC line has been removed. Left  jugular central venous catheter is present and the tip is near the superior cavoatrial junction. IMPRESSION: 1. No significant change in the bilateral lung densities. 2. Support apparatuses as described. Negative for pneumothorax. 3. Left arm PICC line has been removed. Electronically Signed   By: Markus Daft M.D.   On: 02/09/2022 08:45   DG Chest Port 1 View  Result Date: 02/08/2022 CLINICAL DATA:  PICC line placement EXAM: PORTABLE CHEST 1 VIEW COMPARISON:  February 08, 2022 at 4:40 Elodie Panameno.m. FINDINGS: The ET tube, left central line, and ECMO catheter are in stable position. The NG tube terminates below today's film. Nashika Coker new left PICC line is malposition with the distal tip flipped back on itself. The distal tip is likely in the left brachycephalic vein. Opacity in the medial right lung base persists. Increased opacity in the left mid lung. No pneumothorax. No other changes. IMPRESSION: 1. The new left PICC line is malpositioned with the distal tip flipped back on itself. The distal tip is in the left brachycephalic vein. Recommend repositioning. 2. Other support apparatus as above. 3. Persistent opacity in the medial right lung base. 4. Increasing opacity in the left mid lung. These results will be called to the ordering clinician or representative by the Radiologist Assistant, and communication documented in the PACS or [employer]. Electronically Signed   By: Dorise Bullion III M.D.   On: 02/08/2022 15:28   CARDIAC CATHETERIZATION  Result Date: 02/08/2022 Successful VV ECMO placement.   EP STUDY  Result Date: 02/08/2022 Successful VV ECMO placement.   [place] EKG SITE RITE  Result Date: 02/08/2022 If Site Rite image not attached, placement could not be confirmed due to current cardiac rhythm.  DG Chest Port 1 View  Result Date: 02/08/2022 CLINICAL DATA:  27 year old female with status asthmatic Aldahir Litaker status post intubation, ECMO. EXAM: PORTABLE CHEST 1 VIEW COMPARISON:  Portable chest 0232 hours today and  earlier. FINDINGS: Portable AP supine view at 0440 hours. Stable, satisfactory lines and tubes. Stable lung volumes and mediastinal contours. Streaky and confluent right lung base opacity, mild streaky left retrocardiac opacity. Ventilation is stable. No superimposed pneumothorax or pulmonary edema. No osseous abnormality identified. IMPRESSION: 1. Stable, satisfactory lines and tubes. 2. Stable ventilation since 0232 hours today with right > left lung base opacity. Electronically Signed   By: Genevie Ann M.D.   On: 02/08/2022 05:27   DG Abd 1 View  Result Date: 02/08/2022 CLINICAL DATA:  27 year old female oral enteric tube placement. Status asthmaticus. ECMO. EXAM: ABDOMEN - 1 VIEW COMPARISON:  0234 hours today and earlier. FINDINGS: Portable AP supine view at 0438 hours. Enteric tube placed into the stomach, side hole at the level of the gastric fundus. Paucity of bowel gas in the visible abdomen. Superimposed right side chest and  upper abdomen ECMO cannula redemonstrated. Patchy right lung base opacity does not appear significantly changed. Partially visible endotracheal tube and left IJ central line also. Visible mediastinal contours appear stable. No acute osseous abnormality identified. IMPRESSION: 1. Enteric tube placed into the stomach, side hole at the level of the gastric fundus. Paucity of bowel gas. 2. Partially visible ECMO cannula, endotracheal tube and left IJ central line. Patchy right lung base opacity. Electronically Signed   By: Genevie Ann M.D.   On: 02/08/2022 05:25   DG Abd Portable 1V  Result Date: 02/08/2022 CLINICAL DATA:  [id number] with personal history of ECMO, [id number] with orogastric tube placement. EXAM: PORTABLE ABDOMEN - 1 VIEW COMPARISON:  Portable chest earlier today at 2:32 Djibril Glogowski.m. FINDINGS: 2:34 Shahzaib Azevedo.m. NGT extends into the inferior stomach then curves upward with the tip in the cardiofundal area along the medial hemidiaphragm. There is an ECMO catheter in place with its tip in the  intrahepatic IVC at the level of T12-L1. The bowel pattern is nonobstructive with Persephone Schriever general paucity of visible bowel gas. No nephrolithiasis is seen. There is patchy consolidation in the right lung base, as before. IMPRESSION: 1. NGT extends into the inferior stomach then curves upward with the tip in the cardiofundal area along the medial hemidiaphragm. 2. ECMO catheter tip in the intrahepatic IVC at the level of T12-L1. 3. Right lung base airspace disease. 4. Paucity of visible bowel gas. Electronically Signed   By: Telford Nab M.D.   On: 02/08/2022 03:13   DG CHEST PORT 1 VIEW  Result Date: 02/08/2022 CLINICAL DATA:  OG tube placement EXAM: PORTABLE CHEST 1 VIEW COMPARISON:  02/07/2022 FINDINGS: ECMO device in place. Left central line has been repositioned with the tip in the right atrium. Endotracheal tube is unchanged. NG tube is in the stomach. Increasing right lower lobe and left upper lobe airspace disease. Heart is normal size. No effusions or pneumothorax. IMPRESSION: Support devices as above. Increasing right lower lobe and left lower lobe airspace disease. Electronically Signed   By: Rolm Baptise M.D.   On: 02/08/2022 02:50   DG Chest Port 1 View  Result Date: 02/07/2022 CLINICAL DATA:  [id number].  Encounter for central line placement. EXAM: PORTABLE CHEST 1 VIEW COMPARISON:  Portable chest earlier today at 10:15 p.m. FINDINGS: 10:58 p.m. There is Lash Matulich new left IJ central line crossing over to the right with tip in the right subclavian vein near the thoracic outlet. ETT tip is 3.7 cm from the carina. NGT extends down into the stomach then curves upward to the left with the tip pointing cephalad just beneath the mid left hemidiaphragm. Patchy consolidation in the right base along the hemidiaphragm is again noted extending from the infrahilar area, with similar left perihilar airspace disease or atelectasis. The remaining lungs are clear with no new or worsening opacity. The cardiomediastinal silhouette  and vasculature are normal. No acute osseous abnormality. IMPRESSION: 1. New left IJ central line with tip in the right subclavian vein near the thoracic outlet. 2. ETT and NGT in good position. 3. Stable right infrahilar and left perihilar airspace disease. No new or worsening opacity. Electronically Signed   By: Telford Nab M.D.   On: 02/07/2022 23:17   DG CHEST PORT 1 VIEW  Result Date: 02/07/2022 CLINICAL DATA:  [id number] Acute hypoxemic respiratory failure ([place]) [id number] EXAM: PORTABLE CHEST 1 VIEW COMPARISON:  02/07/2022 FINDINGS: Endotracheal tube is 3 cm above the carina. NG tube is in the stomach. Heart is normal size.  Right basilar and left perihilar airspace disease, similar to prior study. No visible effusions or acute bony abnormality. IMPRESSION: Right basilar and left perihilar airspace opacities, stable. Support devices in expected position. Electronically Signed   By: Rolm Baptise M.D.   On: 02/07/2022 22:29   DG Abd Portable 1V  Result Date: 02/07/2022 CLINICAL DATA:  Nasogastric tube placement. EXAM: PORTABLE ABDOMEN - 1 VIEW COMPARISON:  None Available. FINDINGS: Nasal/orogastric tube passes well below the diaphragm to curl within the mid stomach, tip projecting in the fundus. Hazy opacity noted at the right lung base, better visualized on the current portable chest radiograph. This may reflect atelectasis or infection. IMPRESSION: 1. Well-positioned nasal/orogastric tube. 2. Hazy right lung base opacity. Consider pneumonia in the proper clinical setting. Electronically Signed   By: Lajean Manes M.D.   On: 02/07/2022 17:16   DG Chest Port 1 View  Result Date: 02/07/2022 CLINICAL DATA:  Endotracheal tube placement. Also reported central line placement. EXAM: PORTABLE CHEST 1 VIEW COMPARISON:  02/07/2022 at 3:52 p.m. FINDINGS: Endotracheal tube tip projects 1.8 cm above the carina. No visualized central line. Heart, mediastinum and hila are unremarkable. Clear lungs. IMPRESSION: 1.  Well-positioned endotracheal tube. Electronically Signed   By: Lajean Manes M.D.   On: 02/07/2022 17:15   DG Chest Portable 1 View  Result Date: 02/07/2022 CLINICAL DATA:  Shortness of breath. EXAM: PORTABLE CHEST 1 VIEW COMPARISON:  Chest x-ray dated 02/22/2017. FINDINGS: Heart size and mediastinal contours are within normal limits. Mild prominence of the central perihilar bronchovascular markings bilaterally. Lungs are otherwise clear. No pleural effusion or pneumothorax is seen. Osseous structures about the chest are unremarkable. IMPRESSION: Mild prominence of the central perihilar bronchovascular markings bilaterally, compatible with viral bronchitis or reactive airway disease. No evidence of Atoya Andrew consolidating pneumonia. Electronically Signed   By: Franki Cabot M.D.   On: 02/07/2022 16:08    Microbiology: Recent Results (from the past 240 hour(s))  Culture, Urine (Do not remove urinary catheter, catheter placed by urology or difficult to place)     Status: None   Collection Time: 02/17/22 12:05 PM   Specimen: Urine, Catheterized  Result Value Ref Range Status   Specimen Description URINE, CATHETERIZED  Final   Special Requests NONE  Final   Culture   Final    NO GROWTH Performed at [facility], [address] [place], [place] [address]    Report Status 02/18/2022 FINAL  Final  Culture, blood (Routine X 2) w Reflex to ID Panel     Status: None   Collection Time: 02/18/22  2:57 PM   Specimen: BLOOD  Result Value Ref Range Status   Specimen Description BLOOD BLOOD LEFT HAND  Final   Special Requests   Final    BOTTLES DRAWN AEROBIC ONLY Blood Culture adequate volume   Culture   Final    NO GROWTH 5 DAYS Performed at [facility], [place] [address] [place], [place] [address]    Report Status 02/23/2022 FINAL  Final  Culture, blood (Routine X 2) w Reflex to ID Panel     Status: None   Collection Time: 02/18/22  2:57 PM   Specimen: BLOOD  Result Value Ref Range Status    Specimen Description BLOOD BLOOD RIGHT HAND  Final   Special Requests   Final    BOTTLES DRAWN AEROBIC ONLY Blood Culture adequate volume   Culture   Final    NO GROWTH 5 DAYS Performed at [facility], [place] [address] [place], [place] [address]    Report Status 02/23/2022 FINAL  Final     Labs: Basic Metabolic Panel: Recent Labs  Lab 02/21/22 0250 02/22/22 0248 02/23/22 0355 02/24/22 0307  NA 134* 136 135 137  K 3.9 4.0 3.8 4.7  CL 99 101 100 106  CO2 '27 26 25 '$ 17*  GLUCOSE 119* 161* 160* 106*  BUN '19 13 18 17  '$ CREATININE 0.53 0.51 0.51 0.52  CALCIUM 8.6* 8.9 9.0 9.1  MG 1.9 1.8 1.8  --   PHOS 4.9* 4.6 4.6  --    Liver Function Tests: Recent Labs  Lab 02/18/22 1454 02/21/22 0250 02/22/22 0248 02/23/22 0355 02/24/22 0307  AST 41 31 31  --  26  ALT 124* 83* 94*  --  83*  ALKPHOS 48 44 51  --  51  BILITOT 0.3 <0.1* 0.3  --  0.6  PROT 5.7* 5.4* 6.0*  --  6.2*  ALBUMIN 2.8* 2.7* 2.9* 3.2* 3.1*   No results for input(s): "LIPASE", "AMYLASE" in the last 168 hours. No results for input(s): "AMMONIA" in the last 168 hours. CBC: Recent Labs  Lab 02/19/22 0053 02/20/22 0031 02/21/22 0250 02/23/22 0355 02/24/22 0307  WBC 23.4* 23.3* 17.2* 15.7* 15.2*  NEUTROABS  --   --  12.4*  --  10.9*  HGB 9.7* 10.6* 9.7* 10.4* 10.4*  HCT 29.4* 32.3* 29.3* 32.1* 31.6*  MCV 83.8 83.5 83.0 84.9 84.5  PLT 255 267 242 286 303   Cardiac Enzymes: Recent Labs  Lab 02/21/22 0250 02/22/22 0248  CKTOTAL 94 89   BNP: BNP (last 3 results) No results for input(s): "BNP" in the last 8760 hours.  ProBNP (last 3 results) No results for input(s): "PROBNP" in the last 8760 hours.  CBG: Recent Labs  Lab 02/24/22 1133 02/24/22 1602 02/24/22 2127 02/25/22 0814 02/25/22 1135  GLUCAP 112* 107* 95 103* 121*       Signed:  Fayrene Helper MD.  [facility] 02/25/2022, 1:46 PM

## 2022-02-25 NOTE — Progress Notes (Signed)
CSW received consult for psychiatry resources for patient. CSW spoke with patient at bedside. Patient reports she comes from home alone. Patient reports her plan is to go to [facility] when medically ready for dc. CSW offered patient psychiatry resources. Patient accepted. All questions answered. No further questions reported at this time.
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
## 2022-02-26 DIAGNOSIS — J9600 Acute respiratory failure, unspecified whether with hypoxia or hypercapnia: Secondary | ICD-10-CM

## 2022-02-26 LAB — COMPREHENSIVE METABOLIC PANEL
ALT: 58 U/L — ABNORMAL HIGH (ref 0–44)
AST: 17 U/L (ref 15–41)
Albumin: 3.3 g/dL — ABNORMAL LOW (ref 3.5–5.0)
Alkaline Phosphatase: 52 U/L (ref 38–126)
Anion gap: 8 (ref 5–15)
BUN: 13 mg/dL (ref 6–20)
CO2: 25 mmol/L (ref 22–32)
Calcium: 9.3 mg/dL (ref 8.9–10.3)
Chloride: 105 mmol/L (ref 98–111)
Creatinine, Ser: 0.7 mg/dL (ref 0.44–1.00)
GFR, Estimated: >60 mL/min (ref >60)
Glucose, Bld: 105 mg/dL — ABNORMAL HIGH (ref 70–99)
Potassium: 4.4 mmol/L (ref 3.5–5.1)
Sodium: 138 mmol/L (ref 135–145)
Total Bilirubin: 0.4 mg/dL (ref 0.3–1.2)
Total Protein: 6.5 g/dL (ref 6.5–8.1)

## 2022-02-26 LAB — CBC WITH DIFFERENTIAL/PLATELET
Abs Immature Granulocytes: 0.4 10*3/uL — ABNORMAL HIGH (ref 0.00–0.07)
Basophils Absolute: 0 10*3/uL (ref 0.0–0.1)
Basophils Relative: 0 %
Eosinophils Absolute: 0.7 10*3/uL — ABNORMAL HIGH (ref 0.0–0.5)
Eosinophils Relative: 6 %
HCT: 30.5 % — ABNORMAL LOW (ref 36.0–46.0)
Hemoglobin: 9.5 g/dL — ABNORMAL LOW (ref 12.0–15.0)
Immature Granulocytes: 3 %
Lymphocytes Relative: 15 %
Lymphs Abs: 1.8 10*3/uL (ref 0.7–4.0)
MCH: 26.9 pg (ref 26.0–34.0)
MCHC: 31.1 g/dL (ref 30.0–36.0)
MCV: 86.4 fL (ref 80.0–100.0)
Monocytes Absolute: 0.9 10*3/uL (ref 0.1–1.0)
Monocytes Relative: 8 %
Neutro Abs: 8.3 10*3/uL — ABNORMAL HIGH (ref 1.7–7.7)
Neutrophils Relative %: 68 %
Platelets: 309 10*3/uL (ref 150–400)
RBC: 3.53 MIL/uL — ABNORMAL LOW (ref 3.87–5.11)
RDW: 17.4 % — ABNORMAL HIGH (ref 11.5–15.5)
WBC: 12.2 10*3/uL — ABNORMAL HIGH (ref 4.0–10.5)
nRBC: 0 % (ref 0.0–0.2)

## 2022-02-26 LAB — GLUCOSE, CAPILLARY
Glucose-Capillary: 111 mg/dL — ABNORMAL HIGH (ref 70–99)
Glucose-Capillary: 100 mg/dL — ABNORMAL HIGH (ref 70–99)
Glucose-Capillary: 116 mg/dL — ABNORMAL HIGH (ref 70–99)
Glucose-Capillary: 119 mg/dL — ABNORMAL HIGH (ref 70–99)

## 2022-02-26 NOTE — Plan of Care (Signed)
  Problem: RH Balance Goal: LTG Patient will maintain dynamic standing with ADLs (OT) Description: LTG:  Patient will maintain dynamic standing balance with assist during activities of daily living (OT)  Flowsheets (Taken 02/26/2022 1604) LTG: Pt will maintain dynamic standing balance during ADLs with: Independent   Problem: RH Bathing Goal: LTG Patient will bathe all body parts with assist levels (OT) Description: LTG: Patient will bathe all body parts with assist levels (OT) Flowsheets (Taken 02/26/2022 1604) LTG: Pt will perform bathing with assistance level/cueing: Independent   Problem: RH Toileting Goal: LTG Patient will perform toileting task (3/3 steps) with assistance level (OT) Description: LTG: Patient will perform toileting task (3/3 steps) with assistance level (OT)  Flowsheets (Taken 02/26/2022 1604) LTG: Pt will perform toileting task (3/3 steps) with assistance level: Independent   Problem: RH Full Meal Prep Goal: LTG Patient will perform full meal prep w/assist (OT) Description: LTG: Patient will perform full meal prep with assistance, with/without cues (OT). Flowsheets (Taken 02/26/2022 1604) LTG: Pt will perform full meal prep with assistance level of: 7-Independent   Problem: RH Laundry Goal: LTG Patient will perform laundry w/assist, cues (OT) Description: LTG: Patient will perform laundry with assistance, with/without cues (OT). Flowsheets (Taken 02/26/2022 1604) LTG: Pt will perform laundry with assistance level of: Independent   Problem: RH Light Housekeeping Goal: LTG Patient will perform light housekeeping w/assist (OT) Description: LTG: Patient will perform light housekeeping with assistance, with/without cues (OT). Flowsheets (Taken 02/26/2022 1604) LTG: Pt will perform light housekeeping with assistance level of: Independent   Problem: RH Toilet Transfers Goal: LTG Patient will perform toilet transfers w/assist (OT) Description: LTG: Patient will perform  toilet transfers with assist, with/without cues using equipment (OT) Flowsheets (Taken 02/26/2022 1604) LTG: Pt will perform toilet transfers with assistance level of: Independent   Problem: RH Tub/Shower Transfers Goal: LTG Patient will perform tub/shower transfers w/assist (OT) Description: LTG: Patient will perform tub/shower transfers with assist, with/without cues using equipment (OT) Flowsheets (Taken 02/26/2022 1604) LTG: Pt will perform tub/shower stall transfers with assistance level of: Independent

## 2022-02-26 NOTE — Evaluation (Signed)
Physical Therapy Assessment and Plan  Patient Details  Name: Kaitlyn Mcneil MRN: [id number] Date of Birth: 1995-11-06  PT Diagnosis: Difficulty walking and Muscle weakness Rehab Potential: Good ELOS: 5-7 days   Today's Date: 02/26/2022 PT Individual Time: 0940-1040 + [id number] PT Individual Time Calculation (min): 60 min  + 56 min  Hospital Problem: Principal Problem:   Respiratory failure ([place])   Past Medical History:  Past Medical History:  Diagnosis Date   Asthma    Past Surgical History:  Past Surgical History:  Procedure Laterality Date   CANNULATION FOR ECMO (EXTRACORPOREAL MEMBRANE OXYGENATION) N/A 02/07/2022   Procedure: CANNULATION FOR ECMO (EXTRACORPOREAL MEMBRANE OXYGENATION);  Surgeon: Jolaine Artist, MD;  Location: [facility];  Service: Cardiovascular;  Laterality: N/A;   ECMO CANNULATION N/A 02/07/2022   Procedure: ECMO CANNULATION;  Surgeon: Jolaine Artist, MD;  Location: [facility];  Service: Cardiovascular;  Laterality: N/A;   TEE WITHOUT CARDIOVERSION N/A 02/07/2022   Procedure: TRANSESOPHAGEAL ECHOCARDIOGRAM;  Surgeon: Jolaine Artist, MD;  Location: [place] [facility];  Service: Cardiovascular;  Laterality: N/A;    Assessment & Plan Clinical Impression: Patient is a 27 year old female who [place] on 02/07/2022 with 3 days history of acute SOB and wheezing. History positive for asthma. Endorsed fever and cough. Rapidly decompensated and required intubation , mechanical ventilation and PCCM.  Required deep sedation and neuromuscular blockade, steroids. Placed on Cleviprex for hypertensive urgency. Lactic acidosis treated with bicarb. Required ketamine, magnesium. Hemodynamically unstable 2/05 despite aggressive care with maximum intervention. Levophed started. Cardiology consulted for ECMO. ECMO cannulation by Dr. Haroldine Laws on 2/06. Viral panel positive for rhinovirus. Cortrak placed on 2/07 started TF. ECMO decannulation on 2/08. Sedation weaned.  Diltiazem for sinus tachycardia. Extubated on 2/15. Transferred to [facility] service. Nocturnal TF started. Foley removed and voiding spontaneously. Diet advance and Cortrak removed today, 2/23. Discontinue SSI and provide carb modified diet for pre-diabetes. She tolerated breakfast and lunch. Requires min guard assist for mobility. The patient requires inpatient physical medicine and rehabilitation evaluations and treatment secondary to dysfunction due to debility related to severe asthma exacerbation in setting of rhinovirus infection. .  Patient transferred to CIR on 02/25/2022 .   Patient currently requires supervision with mobility secondary to muscle weakness and decreased cardiorespiratoy endurance.  Prior to hospitalization, patient was independent  with mobility and lived with Alone in a Other(Comment) (pt home is an apartment with [facility] access, but pt will d/c to a house) home.  Home access is  Level entry, Other (comment) (house for direct d/c has 3 STE and no rails, full flight inside with R rail ascending. Pt apartment is WC accessible for entry and inside).  Patient will benefit from skilled PT intervention to maximize safe functional mobility, minimize fall risk, and decrease caregiver burden for planned discharge home with intermittent assist.  Anticipate patient will benefit from follow up OP at discharge.  PT - End of Session Activity Tolerance: Tolerates 30+ min activity with multiple rests Endurance Deficit: Yes Endurance Deficit Description: needs rest with long distance walking PT Assessment Rehab Potential (ACUTE/IP ONLY): Good PT Barriers to Discharge: Home environment access/layout PT Barriers to Discharge Comments: stairs PT Patient demonstrates impairments in the following area(s): Balance;Endurance PT Transfers Functional Problem(s): Bed to Chair;Car PT Locomotion Functional Problem(s): Ambulation;Stairs PT Plan PT Intensity: Minimum of 1-2 x/day ,45 to 90 minutes PT Frequency: 5  out of 7 days PT Duration Estimated Length of Stay: 5-7 days PT Treatment/Interventions: Ambulation/gait  training;Discharge planning;Community reintegration;Patient/family education;Stair training;Therapeutic Activities;Therapeutic Exercise;UE/LE Strength taining/ROM PT Transfers Anticipated Outcome(s): Independent PT Locomotion Anticipated Outcome(s): Independent PT Recommendation Follow Up Recommendations: Outpatient PT (for continued support to return to vocation level mobility) Patient destination: Home Equipment Recommended: To be determined   PT Evaluation Precautions/Restrictions Precautions Precautions: Fall Restrictions Weight Bearing Restrictions: No General Chart Reviewed: Yes Family/Caregiver Present: No Vital SignsTherapy Vitals Pulse Rate: 85 Resp: 17 Patient Position (if appropriate): Sitting Oxygen Therapy SpO2: 100 % O2 Device: Room Air Pain Pain Assessment Pain Scale: 0-10 Pain Score: 0-No pain Multiple Pain Sites: No Pain Interference Pain Interference Pain Effect on Sleep: 1. Rarely or not at all Pain Interference with Therapy Activities: 1. Rarely or not at all Pain Interference with Day-to-Day Activities: 1. Rarely or not at all Home Living/Prior [place] Available Help at Discharge: Family;Available 24 hours/day Type of Home: Other(Comment) (pt home is an apartment with [facility] access, but pt will d/c to a house) Home Access: Level entry;Other (comment) (house for direct d/c has 3 STE and no rails, full flight inside with R rail ascending. Pt apartment is WC accessible for entry and inside) Home Layout: Two level;Bed/bath upstairs;1/2 bath on main level Alternate Level Stairs-Number of Steps: flight of stairs in house for direct d/c, has 3 STE no rails and full flight inside with R ascending rail. Pt apartment is WC accessible for entry and inside Bathroom Shower/Tub: Chiropodist: Standard  Lives With: Alone Prior  Function Level of Independence: Independent with basic ADLs  Able to Take Stairs?: Yes Vision/Perception  Vision - History Ability to See in Adequate Light: 0 Adequate Vision - Assessment Additional Comments: pt broke glasses and is getting new ones  Cognition Overall Cognitive Status: Within Functional Limits for tasks assessed Orientation Level: Oriented X4 Sensation Sensation Light Touch: Appears Intact Coordination Heel Shin Test: [facility] Motor  Motor Motor: Within Functional Limits   Trunk/Postural Assessment  Cervical Assessment Cervical Assessment: Within Functional Limits Thoracic Assessment Thoracic Assessment: Within Functional Limits Lumbar Assessment Lumbar Assessment: Within Functional Limits Postural Control Postural Control: Within Functional Limits  Balance Balance Balance Assessed: Yes Standardized Balance Assessment Standardized Balance Assessment: Berg Balance Test Berg Balance Test Sit to Stand: Able to stand without using hands and stabilize independently Standing Unsupported: Able to stand safely 2 minutes Sitting with Back Unsupported but Feet Supported on Floor or Stool: Able to sit safely and securely 2 minutes Stand to Sit: Sits safely with minimal use of hands Transfers: Able to transfer safely, minor use of hands Standing Unsupported with Eyes Closed: Able to stand 10 seconds safely Standing Ubsupported with Feet Together: Able to place feet together independently and stand 1 minute safely From Standing, Reach Forward with Outstretched Arm: Can reach confidently >25 cm (10") From Standing Position, Pick up Object from Floor: Able to pick up shoe safely and easily From Standing Position, Turn to Look Behind Over each Shoulder: Looks behind from both sides and weight shifts well Turn 360 Degrees: Able to turn 360 degrees safely in 4 seconds or less Standing Unsupported, Alternately Place Feet on Step/Stool: Able to stand independently and complete 8  steps >20 seconds Standing Unsupported, One Foot in Front: Able to plae foot ahead of the other independently and hold 30 seconds Standing on One Leg: Able to lift leg independently and hold 5-10 seconds Total Score: 53 Extremity Assessment      RLE Assessment RLE Assessment: Within Functional Limits LLE Assessment LLE Assessment: Within Functional Limits  Care Tool Care Tool Bed Mobility Roll left and right activity   Roll left and right assist level: Independent    Sit to lying activity   Sit to lying assist level: Independent    Lying to sitting on side of bed activity   Lying to sitting on side of bed assist level: the ability to move from lying on the back to sitting on the side of the bed with no back support.: Independent     Care Tool Transfers Sit to stand transfer   Sit to stand assist level: Independent    Chair/bed transfer   Chair/bed transfer assist level: Supervision/Verbal cueing     Toilet transfer   Assist Level: Supervision/Verbal cueing    Geneticist, molecular transfer assist level: Supervision/Verbal cueing      Care Tool Locomotion Ambulation   Assist level: Supervision/Verbal cueing Assistive device: No Device Max distance: 400  Walk 10 feet activity   Assist level: Supervision/Verbal cueing Assistive device: No Device   Walk 50 feet with 2 turns activity   Assist level: Supervision/Verbal cueing Assistive device: No Device  Walk 150 feet activity   Assist level: Supervision/Verbal cueing Assistive device: No Device  Walk 10 feet on uneven surfaces activity   Assist level: Supervision/Verbal cueing Assistive device: Other (comment) (no device)  Stairs   Assist level: Contact Guard/Touching assist Stairs assistive device: 1 hand rail Max number of stairs: 12  Walk up/down 1 step activity   Walk up/down 1 step (curb) assist level: Contact Guard/Touching assist Walk up/down 1 step or curb assistive device: 1 hand rail  Walk up/down 4  steps activity   Walk up/down 4 steps assist level: Contact Guard/Touching assist Walk up/down 4 steps assistive device: 1 hand rail  Walk up/down 12 steps activity   Walk up/down 12 steps assist level: Contact Guard/Touching assist Walk up/down 12 steps assistive device: 1 hand rail  Pick up small objects from floor   Pick up small object from the floor assist level: Supervision/Verbal cueing Pick up small object from the floor assistive device: none  Wheelchair Is the patient using a wheelchair?: No   Wheelchair activity did not occur: N/A      Wheel 50 feet with 2 turns activity Wheelchair 50 feet with 2 turns activity did not occur: N/A    Wheel 150 feet activity Wheelchair 150 feet activity did not occur: N/A      Refer to Care Plan for Long Term Goals  SHORT TERM GOAL WEEK 1 PT Short Term Goal 1 (Week 1): STG = LTG 2/2 LOS  Recommendations for other services: None   Skilled Therapeutic Intervention Mobility Bed Mobility Bed Mobility: Supine to Sit;Sit to Supine Supine to Sit: Independent Sit to Supine: Independent Transfers Transfers: Sit to [employer] Transfers Sit to Stand: Independent Stand Pivot Transfers: Supervision/Verbal cueing Transfer (Assistive device): None Locomotion  Gait Ambulation: Yes Gait Assistance: Supervision/Verbal cueing Gait Distance (Feet): 400 Feet Assistive device: None Stairs / Additional Locomotion Stairs: Yes Stairs Assistance: Contact Guard/Touching assist;Supervision/Verbal cueing Stair Management Technique: No rails;One rail Right Number of Stairs: 12 Ramp: Supervision/Verbal cueing Curb: Supervision/Verbal cueing Wheelchair Mobility Wheelchair Mobility: No   Discharge Criteria: Patient will be discharged from PT if patient refuses treatment 3 consecutive times without medical reason, if treatment goals not met, if there is a change in medical status, if patient makes no progress towards goals or if patient is  discharged from hospital.  The above assessment, treatment plan, treatment  alternatives and goals were discussed and mutually agreed upon: by patient  Session Notes:   Session 1: Chart reviewed and pt agreeable to therapy. Pt received semi-reclined in bed with no c/o pain. Session focused on evaluation and initiate of stair training to promote safe home access. Pt initiated session with evaluation as described above. Pt then completed block training of stairs with supervision for 12 steps with R ascending rail and CGA for 3 steps no rail. Pt then amb >500 ft with supervision before returning to room to complete review of goals, therapy practice, and care plan. At end of session, pt was left seated in recliner with alarm engaged, nurse call bell and all needs in reach.  Session 2: Chart reviewed and pt agreeable to therapy. Pt received semi-reclined in bed with no c/o pain. Also of note, family present in room, so PT discussed safe amb in room and cleared present family members (father and cousin) for supervision of pt amb in room. Family members demonstrated and verbalized ability to supervise pt ambulation. Session focused on promotion of activity tolerance and stair navigation safety to promote safe return to home. Pt initiated session with amb of 87mns using supervision  + no AD around 4th and 1st floors. Pt then completed 4x4 steps with unilateral rail + supervision and 1x4 steps with no rails using close supervision. Pt then completed 12 mins on NuStep for interval training at easy-med-hard levels (workloads 4-10). Pt and PT returned to room where PT discussed role of PT with family and answered questions regarding care plan. At end of session, pt was left semi-reclined in bed with nurse call bell and all needs in reach.       KMarquette Old2/24/2024, 10:46 AM
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
## 2022-02-26 NOTE — Evaluation (Signed)
Occupational Therapy Assessment and Plan  Patient Details  Name: Kaitlyn Mcneil MRN: [id number] Date of Birth: [birth year]  OT Diagnosis: muscle weakness (generalized) Rehab Potential: Rehab Potential (ACUTE ONLY): Good ELOS: 5-7 days   Today's Date: 02/26/2022 OT Individual Time: [id number] OT Individual Time Calculation (min): 68 min     Hospital Problem: Principal Problem:   Respiratory failure ([place])   Past Medical History:  Past Medical History:  Diagnosis Date   Asthma    Past Surgical History:  Past Surgical History:  Procedure Laterality Date   CANNULATION FOR ECMO (EXTRACORPOREAL MEMBRANE OXYGENATION) N/A 02/07/2022   Procedure: CANNULATION FOR ECMO (EXTRACORPOREAL MEMBRANE OXYGENATION);  Surgeon: Jolaine Artist, MD;  Location: [facility];  Service: Cardiovascular;  Laterality: N/A;   ECMO CANNULATION N/A 02/07/2022   Procedure: ECMO CANNULATION;  Surgeon: Jolaine Artist, MD;  Location: [facility];  Service: Cardiovascular;  Laterality: N/A;   TEE WITHOUT CARDIOVERSION N/A 02/07/2022   Procedure: TRANSESOPHAGEAL ECHOCARDIOGRAM;  Surgeon: Jolaine Artist, MD;  Location: [facility];  Service: Cardiovascular;  Laterality: N/A;    Assessment & Plan Clinical Impression: Patient is a 27 year old female who [place] on 02/07/2022 with 3 days history of acute SOB and wheezing. History positive for asthma. Endorsed fever and cough. Rapidly decompensated and required intubation , mechanical ventilation and PCCM.  Required deep sedation and neuromuscular blockade, steroids. Placed on Cleviprex for hypertensive urgency. Lactic acidosis treated with bicarb. Required ketamine, magnesium. Hemodynamically unstable 2/05 despite aggressive care with maximum intervention. Levophed started. Cardiology consulted for ECMO. ECMO cannulation by Dr. Haroldine Laws on 2/06. Viral panel positive for rhinovirus. Cortrak placed on 2/07 started TF. ECMO decannulation on 2/08. Sedation  weaned. Diltiazem for sinus tachycardia. Extubated on 2/15. Transferred to [facility] service. Nocturnal TF started. Foley removed and voiding spontaneously. Diet advance and Cortrak removed today, 2/23. Discontinue SSI and provide carb modified diet for pre-diabetes. She tolerated breakfast and lunch. Requires min guard assist for mobility. The patient requires inpatient physical medicine and rehabilitation evaluations and treatment secondary to dysfunction due to debility related to severe asthma exacerbation in setting of rhinovirus infection. Patient transferred to CIR on 02/25/2022 .    Patient currently requires supervision-CGA with basic self-care skills and IADL secondary to muscle weakness, decreased cardiorespiratoy endurance, and decreased standing balance and decreased balance strategies.  Prior to hospitalization, patient could complete BADLs, IADL, and vocational job with independent .  Patient will benefit from skilled intervention to decrease level of assist with basic self-care skills, increase independence with basic self-care skills, and increase level of independence with iADL prior to discharge home with care partner.  Anticipate patient will require intermittent supervision and no further OT follow recommended.  OT - End of Session Activity Tolerance: Decreased this session Endurance Deficit: Yes Endurance Deficit Description: needs rest with long distance walking OT Assessment Rehab Potential (ACUTE ONLY): Good OT Patient demonstrates impairments in the following area(s): Balance;Safety;Endurance;Motor OT Basic ADL's Functional Problem(s): Bathing;Toileting;Dressing OT Advanced ADL's Functional Problem(s): Full Meal Preparation;Laundry;Light Housekeeping OT Transfers Functional Problem(s): Tub/Shower;Toilet OT Plan OT Intensity: Minimum of 1-2 x/day, 45 to 90 minutes OT Frequency: 5 out of 7 days OT Duration/Estimated Length of Stay: 5-7 days OT Treatment/Interventions:  Balance/vestibular training;Discharge planning;Pain management;Self Care/advanced ADL retraining;Therapeutic Activities;UE/LE Coordination activities;Disease mangement/prevention;Functional mobility training;Patient/family education;Therapeutic Exercise;Community reintegration;DME/adaptive equipment instruction;Neuromuscular re-education;Psychosocial support;UE/LE Strength taining/ROM OT Self Feeding Anticipated Outcome(s): Independent OT Basic Self-Care Anticipated Outcome(s): Independent OT Toileting Anticipated Outcome(s): Independent OT Bathroom Transfers Anticipated Outcome(s):  Independent OT Recommendation Recommendations for Other Services: Therapeutic Recreation consult Therapeutic Recreation Interventions: Pet therapy;Outing/community reintergration Patient destination: Home Follow Up Recommendations: None Equipment Recommended: To be determined   OT Evaluation Precautions/Restrictions  Precautions Precautions: Fall Precaution Comments: fall risk increases with fatigue Restrictions Weight Bearing Restrictions: No General Chart Reviewed: Yes Additional Pertinent History: Asthma Family/Caregiver Present: Yes (Father)   Home Living/Prior Functioning Home Living Available Help at Discharge: Family, Available 24 hours/day Type of Home: Other(Comment) (Pt lives in an apartment alone, however will be d/c'ing to a house with family) Home Access: Level entry, Other (comment) (home for d/c has 3 STE and accending set of stairs) Home Layout: Two level, Bed/bath upstairs, 1/2 bath on main level Alternate Level Stairs-Number of Steps: flight of stairs in house for direct d/c, has 3 STE no rails and full flight inside with R ascending rail. Pt apartment is WC accessible for entry and inside Bathroom Shower/Tub: Chiropodist: Standard Bathroom Accessibility: Yes  Lives With: Alone IADL History Homemaking Responsibilities: Yes Meal Prep Responsibility:  Primary Laundry Responsibility: Primary Cleaning Responsibility: Primary Bill Paying/Finance Responsibility: Primary Shopping Responsibility: Primary Child Care Responsibility: No Current License: Yes Mode of Transportation: Car Occupation: Full time employment Type of Occupation: Works in [employer] at [employer] and [employer]: Enjoys Latin dancing and singing Prior Function Level of Independence: Independent with basic ADLs, Independent with homemaking with ambulation  Able to [place]?: Yes Driving: Yes Vocation: Full time employment Vision Baseline Vision/History: 1 Wears glasses Ability to See in Adequate Light: 1 Impaired (challenges with distant vision; Pt reporting her glasses are broken and she needs new ones) Patient Visual Report: No change from baseline Vision Assessment?: Vision impaired- to be further tested in functional context Perception  Perception: Within Functional Limits Praxis Praxis: Intact Cognition Cognition Overall Cognitive Status: Within Functional Limits for tasks assessed Arousal/Alertness: Awake/alert Orientation Level: Person;Place;Situation Person: Oriented Place: Oriented Situation: Oriented Memory: Appears intact Attention: Focused;Sustained;Selective;Alternating Focused Attention: Appears intact Sustained Attention: Appears intact Selective Attention: Appears intact Divided Attention: Appears intact Awareness: Appears intact Problem Solving: Appears intact Executive Function:  (all intact) Safety/Judgment: Appears intact Brief Interview for Mental Status (BIMS) Repetition of Three Words (First Attempt): 3 Temporal Orientation: Year: Correct Temporal Orientation: Month: Accurate within 5 days Temporal Orientation: Day: Correct Recall: "Sock": Yes, no cue required Recall: "Blue": Yes, no cue required Recall: "Bed": Yes, no cue required BIMS Summary Score: 15 Sensation Sensation Light Touch: Appears Intact Hot/Cold:  Appears Intact Proprioception: Appears Intact Stereognosis: Appears Intact Coordination Gross Motor Movements are Fluid and Coordinated: Yes Fine Motor Movements are Fluid and Coordinated: Yes Finger Nose Finger Test: Smooth, eval movements without dysmetria noted Motor  Motor Motor: Within Functional Limits  Trunk/Postural Assessment  Cervical Assessment Cervical Assessment: Within Functional Limits Thoracic Assessment Thoracic Assessment: Within Functional Limits Lumbar Assessment Lumbar Assessment: Within Functional Limits Postural Control Postural Control: Within Functional Limits  Balance Balance Balance Assessed: Yes Static Sitting Balance Static Sitting - Balance Support: Feet supported Static Sitting - Level of Assistance: 7: Independent Dynamic Sitting Balance Dynamic Sitting - Balance Support: Feet supported Dynamic Sitting - Level of Assistance: 7: Independent Static Standing Balance Static Standing - Balance Support: No upper extremity supported Static Standing - Level of Assistance: 5: Stand by assistance (supervision) Dynamic Standing Balance Dynamic Standing - Balance Support: No upper extremity supported Dynamic Standing - Level of Assistance: 5: Stand by assistance (supervision) Extremity/Trunk Assessment RUE Assessment RUE Assessment: Within Functional Limits Passive Range of  Motion (PROM) Comments: WFL Active Range of Motion (AROM) Comments: WFL General Strength Comments: Mild strength deficit d/t deconditioning LUE Assessment LUE Assessment: Within Functional Limits Passive Range of Motion (PROM) Comments: WFL Active Range of Motion (AROM) Comments: WFL General Strength Comments: Mild strength deficit  Care Tool Care Tool Self Care Eating   Eating Assist Level: Independent    Oral Care    Oral Care Assist Level: Supervision/Verbal cueing    Bathing   Body parts bathed by patient: Right arm;Right lower leg;Left arm;Left lower  leg;Chest;Face;Abdomen;Front perineal area;Buttocks;Right upper leg;Left upper leg     Assist Level: Supervision/Verbal cueing    Upper Body Dressing(including orthotics)   What is the patient wearing?: Bra;Pull over shirt   Assist Level: Supervision/Verbal cueing    Lower Body Dressing (excluding footwear)   What is the patient wearing?: Pants;Underwear/pull up Assist for lower body dressing: Supervision/Verbal cueing    Putting on/Taking off footwear   What is the patient wearing?: Socks;Shoes Assist for footwear: Supervision/Verbal cueing       Care Tool Toileting Toileting activity   Assist for toileting: Supervision/Verbal cueing     Care Tool Bed Mobility Roll left and right activity   Roll left and right assist level: Independent    Sit to lying activity   Sit to lying assist level: Independent    Lying to sitting on side of bed activity   Lying to sitting on side of bed assist level: the ability to move from lying on the back to sitting on the side of the bed with no back support.: Independent     Care Tool Transfers Sit to stand transfer   Sit to stand assist level: Independent    Chair/bed transfer   Chair/bed transfer assist level: Supervision/Verbal cueing     Toilet transfer   Assist Level: Supervision/Verbal cueing     Care Tool Cognition  Expression of Ideas and Wants Expression of Ideas and Wants: 4. Without difficulty (complex and basic) - expresses complex messages without difficulty and with speech that is clear and easy to understand  Understanding Verbal and Non-Verbal Content Understanding Verbal and Non-Verbal Content: 4. Understands (complex and basic) - clear comprehension without cues or repetitions   Memory/Recall Ability Memory/Recall Ability : Current season;Location of own room;That he or she is in a hospital/hospital unit   Refer to Care Plan for [place] 1 OT Short Term Goal 1 (Week 1): STG=LTG d/t Pt  ELOS  Recommendations for other services: Therapeutic Recreation  Pet therapy, Kitchen group, and Outing/community reintegration   Skilled Therapeutic Intervention Skilled Therapeutic Interventions/Progress Updates: 1:1 skilled OT evaluation and intervention initiated with education provided on OT role, goals, and POC. Pt Father present during evaluation. Pt receptive to completing shower with IV and sutures covered prior to shower. Pt ambulated without a device during session wit supervision. Pt able to complete bathing seated on tub bench with supervision. Dressing completed in standing with support from wall with supervision, no LOB noted. Pt required seated rest break following and then completed grooming/hygiene tasks standing at sink. Pt presenting with endurance and strength deficit this session impacting BADL and IADL performance. Pt would benefit from continued OT services in IPR setting.  ADL ADL Eating: Independent Where Assessed-Eating: Edge of bed Grooming: Supervision/safety Where Assessed-Grooming: Standing at sink Upper Body Bathing: Supervision/safety Where Assessed-Upper Body Bathing: Shower Lower Body Bathing: Supervision/safety Where Assessed-Lower Body Bathing: Shower Upper Body Dressing: Supervision/safety Where Assessed-Upper Body Dressing:  Chair Lower Body Dressing: Supervision/safety Where Assessed-Lower Body Dressing: Chair Toileting: Supervision/safety Where Assessed-Toileting: Glass blower/designer: Distant supervision Armed forces technical officer Method: Counselling psychologist: Energy manager: Close supervison Clinical cytogeneticist Method: Optometrist: Energy manager: Close supervision Social research officer, government Method: Heritage manager: Grab bars;Transfer tub bench Mobility  Bed Mobility Bed Mobility: Supine to Sit;Sit to Supine Supine to Sit: Independent Sit to Supine:  Independent Transfers Sit to Stand: Independent   Discharge Criteria: Patient will be discharged from OT if patient refuses treatment 3 consecutive times without medical reason, if treatment goals not met, if there is a change in medical status, if patient makes no progress towards goals or if patient is discharged from hospital.  The above assessment, treatment plan, treatment alternatives and goals were discussed and mutually agreed upon: by patient and by family  Janey Genta 02/26/2022, 2:18 PM

## 2022-02-27 LAB — GLUCOSE, CAPILLARY: Glucose-Capillary: 108 mg/dL — ABNORMAL HIGH (ref 70–99)
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
## 2022-02-28 LAB — BASIC METABOLIC PANEL
Anion gap: 3 — ABNORMAL LOW (ref 5–15)
BUN: 13 mg/dL (ref 6–20)
CO2: 28 mmol/L (ref 22–32)
Calcium: 9.2 mg/dL (ref 8.9–10.3)
Chloride: 104 mmol/L (ref 98–111)
Creatinine, Ser: 0.53 mg/dL (ref 0.44–1.00)
GFR, Estimated: >60 mL/min (ref >60)
Glucose, Bld: 102 mg/dL — ABNORMAL HIGH (ref 70–99)
Potassium: 3.8 mmol/L (ref 3.5–5.1)
Sodium: 135 mmol/L (ref 135–145)

## 2022-02-28 LAB — CBC
HCT: 29.9 % — ABNORMAL LOW (ref 36.0–46.0)
Hemoglobin: 9.9 g/dL — ABNORMAL LOW (ref 12.0–15.0)
MCH: 27.8 pg (ref 26.0–34.0)
MCHC: 33.1 g/dL (ref 30.0–36.0)
MCV: 84 fL (ref 80.0–100.0)
Platelets: 333 10*3/uL (ref 150–400)
RBC: 3.56 MIL/uL — ABNORMAL LOW (ref 3.87–5.11)
RDW: 17.3 % — ABNORMAL HIGH (ref 11.5–15.5)
WBC: 11.9 10*3/uL — ABNORMAL HIGH (ref 4.0–10.5)
nRBC: 0 % (ref 0.0–0.2)

## 2022-02-28 NOTE — Progress Notes (Signed)
[name], female   DOB: 25-May-1995, 27 y.o.   MRN: [id number]  Referral made to [facility] on [address] closer to pt's home. Also obtained PCP Thomasene Ripple and appointment 3/5 @ 10:00-new patient appointment placed in DC instructions. Set for discharge tomorrow

## 2022-02-28 NOTE — Progress Notes (Signed)
Inpatient Rehabilitation  Patient information reviewed and entered into eRehab system by Cathlene Gardella M. Eschol Auxier, M.A., CCC/SLP, PPS Coordinator.  Information including medical coding, functional ability and quality indicators will be reviewed and updated through discharge.    

## 2022-02-28 NOTE — Progress Notes (Signed)
PMR Admission Coordinator Pre-Admission Assessment   Patient: Kaitlyn Mcneil is an 27 y.o., female MRN: [id number] DOB: Aug 23, 1995 Height: 5' (152.4 cm) Weight: 70.9 kg   Insurance Information HMO:     PPO: yes     PCP:      IPA:      80/20:      OTHER:  PRIMARY: BCBS      Policy#: [id number]      Subscriber: pt through employer CM Name: faxed approval      Phone#: n/a     Fax#: [phone] Pre-Cert#: [id number] [facility] for CIR from fax with [place] with updates due to fax listed above on 03/08/2022      Employer:  Benefits:  Phone #: [phone]     Name:  Eff. Date: 02/03/22     Deduct: $2000 ($0 met)      Out of Pocket Max: $7000 ($0 met)      Life Max: n/a CIR: 80%      SNF: 80% Outpatient: 80%     Co-Pay: 20% Home Health: 80%      Co-Pay: 20% DME: 80%     Co-Pay: 20% Providers:  SECONDARY:       Policy#:      Phone#:    Development worker, community:       Phone#:    The Therapist, art Information Summary" for patients in Inpatient Rehabilitation Facilities with attached "Privacy Act [place] Records" was provided and verbally reviewed with: N/A   Emergency Contact Information Contact Information       Name Relation Home Work Mobile    Victoria Mother     [phone]           Current Medical History  Patient Admitting Diagnosis: debility 2/2 acute respiratory failure 2/2 URI superimposed on asthma    History of Present Illness: pt is a 27 y/o female with PMH of asthma, allergic rhinitis, obesity, and eczema who presented to [facility] on 2/5 with acute dyspnea that did not improve with home medications.  Exam positive for fever, cough, and URI symptoms.  In ED started on bipap, steroids, and nebulizers, but she continued to have increased work of breathing and required intubation.  Required significant sedation and paralytics to achieve adequate minute ventilation.  She required ECMO cannulation on 2/6, and was decannulated on 2/8.  She was successfully extubated  on 2/15.  Hospital course complicated by delirium, tachycardia, and agitation requiring precedex.  She is tolerating a regular diet.  Initially with Cortrak for supplemental nutrition but this was d/c'd on 2/21.  Therapy evaluations completed and pt was recommended for CIR.    Patient's medical record from [facility] has been reviewed by the rehabilitation admission coordinator and physician.   Past Medical History      Past Medical History:  Diagnosis Date   Asthma        Has the patient had major surgery during 100 days prior to admission? Yes   Family History   family history is not on file.   Current Medications   Current Facility-Administered Medications:    0.9 %  sodium chloride infusion, 250 mL, Intravenous, Continuous, Ramaswamy, Murali, MD, Last Rate: 10 mL/hr at 02/15/22 0246, 500 mL at 02/15/22 0246   0.9 %  sodium chloride infusion, , Intravenous, PRN, Julian Hy, DO, Stopped at 02/17/22 1900   diltiazem (CARDIZEM) tablet 60 mg, 60 mg, Oral, Q6H, Gonfa, Taye T, MD, 60 mg at 02/25/22 1254   enoxaparin (  LOVENOX) injection 40 mg, 40 mg, Subcutaneous, Daily, Agarwala, Ravi, MD, 40 mg at 02/25/22 1023   feeding supplement (ENSURE ENLIVE / ENSURE PLUS) liquid 237 mL, 237 mL, Oral, BID BM, Gonfa, Taye T, MD, 237 mL at 02/25/22 1024   hydrALAZINE (APRESOLINE) tablet 25 mg, 25 mg, Oral, Q6H PRN, Gonfa, Taye T, MD   insulin aspart (novoLOG) injection 0-5 Units, 0-5 Units, Subcutaneous, QHS, Gonfa, Taye T, MD   insulin aspart (novoLOG) injection 0-9 Units, 0-9 Units, Subcutaneous, TID WC, Gonfa, Taye T, MD, 1 Units at 02/25/22 1233   levalbuterol (XOPENEX) nebulizer solution 0.63 mg, 0.63 mg, Nebulization, Q6H PRN, Cyndia Skeeters, Taye T, MD   loratadine (CLARITIN) tablet 10 mg, 10 mg, Oral, Daily, Gonfa, Taye T, MD, 10 mg at 02/25/22 1022   mometasone-formoterol (DULERA) 200-5 MCG/ACT inhaler 2 puff, 2 puff, Inhalation, BID, Noemi Chapel P, DO, 2 puff at 02/25/22 0905   montelukast  (SINGULAIR) tablet 10 mg, 10 mg, Oral, QHS, Gonfa, Taye T, MD, 10 mg at 02/24/22 2135   multivitamin with minerals tablet 1 tablet, 1 tablet, Oral, Daily, Gonfa, Taye T, MD, 1 tablet at 02/25/22 1022   ondansetron (ZOFRAN) injection 4 mg, 4 mg, Intravenous, Q6H PRN, Ollis, Brandi L, NP   Oral care mouth rinse, 15 mL, Mouth Rinse, 4 times per day, Agarwala, Einar Grad, MD, 15 mL at 02/25/22 0854   polyethylene glycol (MIRALAX / GLYCOLAX) packet 17 g, 17 g, Oral, BID PRN, Cyndia Skeeters, Taye T, MD   senna-docusate (Senokot-S) tablet 1 tablet, 1 tablet, Oral, Daily, Gonfa, Taye T, MD, 1 tablet at 02/22/22 1000   triamcinolone ointment (KENALOG) 0.1 %, , Topical, BID, Elodia Florence., MD, Given at 02/25/22 1024   umeclidinium bromide (INCRUSE ELLIPTA) 62.5 MCG/ACT 1 puff, 1 puff, Inhalation, Daily, Julian Hy, DO, 1 puff at 02/25/22 0900   Patients Current Diet:  Diet Order                  Diet - low sodium heart healthy             Diet regular Room service appropriate? Yes with Assist; Fluid consistency: Thin  Diet effective now                         Precautions / Restrictions Precautions Precautions: Fall Precaution Comments: fall risk increases with fatigue Restrictions Weight Bearing Restrictions: No    Has the patient had 2 or more falls or a fall with injury in the past year? No   Prior Activity Level Community (5-7x/wk): indep prior to admit with no DME, driving, working FT for [employer].   Prior Functional Level Self Care: Did the patient need help bathing, dressing, using the toilet or eating? Independent   Indoor Mobility: Did the patient need assistance with walking from room to room (with or without device)? Independent   Stairs: Did the patient need assistance with internal or external stairs (with or without device)? Independent   Functional Cognition: Did the patient need help planning regular tasks such as shopping or remembering to take medications?  Independent   Patient Information Are you of Hispanic, Latino/a,or Spanish origin?: A. No, not of Hispanic, Latino/a, or Spanish origin What is your race?: B. Black or African American Do you need or want an interpreter to communicate with a doctor or health care staff?: 0. No   Patient's Response To:  Health Literacy and Transportation Is the patient able to  respond to health literacy and transportation needs?: Yes Health Literacy - How often do you need to have someone help you when you read instructions, pamphlets, or other written material from your doctor or pharmacy?: Never In the past 12 months, has lack of transportation kept you from medical appointments or from getting medications?: No In the past 12 months, has lack of transportation kept you from meetings, work, or from getting things needed for daily living?: No   Development worker, international aid / [place] Devices/Equipment: None Home Equipment: None   Prior Device Use: Indicate devices/aids used by the patient prior to current illness, exacerbation or injury? None of the above   Current Functional Level Cognition   Overall Cognitive Status: Within Functional Limits for tasks assessed Current Attention Level: Selective Orientation Level: Oriented X4 Following Commands: Follows one step commands consistently, Follows multi-step commands inconsistently, Follows multi-step commands with increased time Safety/Judgement: Decreased awareness of safety, Decreased awareness of deficits General Comments: continues to have some lapses of memory related to early hospital stay, well aware of endurance deficits    Extremity Assessment (includes Sensation/Coordination)   Upper Extremity Assessment: Generalized weakness  Lower Extremity Assessment: Defer to PT evaluation RLE Deficits / Details: grossly 2+/5 strength LLE Deficits / Details: grossly 2+/5 strength     ADLs   Overall ADL's : Needs  assistance/impaired Eating/Feeding: Minimal assistance, Bed level Grooming: Oral care, Wash/dry hands, Standing, Supervision/safety Upper Body Bathing: Moderate assistance, Sitting Lower Body Bathing: Maximal assistance, Bed level Upper Body Dressing : Set up, Sitting Lower Body Dressing: Set up, Sitting/lateral leans Toilet Transfer: Supervision/safety, Ambulation Toileting- Clothing Manipulation and Hygiene: Supervision/safety, Sit to/from stand Functional mobility during ADLs: Supervision/safety General ADL Comments: Total A for all aspects of ADL including grooming at bed level     Mobility   Overal bed mobility: Modified Independent Bed Mobility: Supine to Sit Supine to sit: Min guard Sit to supine: Min assist General bed mobility comments: up in chair upon PT arrival.     Transfers   Overall transfer level: Needs assistance Equipment used: None Transfers: Sit to/from Stand Sit to Stand: Supervision General transfer comment: no physical assist from bed or toilet     Ambulation / Gait / Stairs / Wheelchair Mobility   Ambulation/Gait Ambulation/Gait assistance: Min guard, Min assist Gait Distance (Feet): 150 Feet (+ 100) Assistive device: Rolling walker (2 wheels), None Gait Pattern/deviations: Step-through pattern, Narrow base of support, Decreased stride length General Gait Details: Steady gait with RW 150', followed by amb without AD min assist Gait velocity: decreased Gait velocity interpretation: <1.8 ft/sec, indicate of risk for recurrent falls Pre-gait activities: Standing weigth shift lateral, a/p. Static march, BIL hands supported.     Posture / Balance Dynamic Sitting Balance Sitting balance - Comments: Sat EOB x 10 minutes with min to mod assist. Balance Overall balance assessment: Needs assistance Sitting-balance support: Feet supported, No upper extremity supported Sitting balance-Leahy Scale: Normal Sitting balance - Comments: Sat EOB x 10 minutes with min  to mod assist. Postural control: Posterior lean, Left lateral lean Standing balance support: During functional activity Standing balance-Leahy Scale: Good Standing balance comment: walking to bathroom and sink without AD High level balance activites: Side stepping, Backward walking High Level Balance Comments: min assist, 10'/trial     Special needs/care consideration N/a    Previous Home Environment (from acute therapy documentation) Living Arrangements: Parent Available Help at Discharge: Family, Available 24 hours/day Type of Home: House Home Layout: Two level, Bed/bath upstairs,  1/2 bath on main level Alternate Level Stairs-Number of Steps: flight Home Access: Level entry Bathroom Shower/Tub: Tub/shower unit, Multimedia programmer: Standard Home Care Services: No Additional Comments: Information per patient. Will need to confirm   Discharge Living Setting Plans for Discharge Living Setting: Patient's home Type of Home at Discharge: Apartment Discharge Home Layout: One level Discharge Home Access: Level entry Discharge Bathroom Shower/Tub: Tub/shower unit Discharge Bathroom Toilet: Handicapped height Discharge Bathroom Accessibility: Yes How Accessible: Accessible via wheelchair Does the patient have any problems obtaining your medications?: No   Social/Family/Support Systems Anticipated Caregiver: Mod I goals, but mom Temika Prothro can provide some supervision if needed Anticipated Caregiver's Contact Information: [phone] Ability/Limitations of Caregiver: works from home Caregiver Availability: Other (Comment) (works from home, can provide intermittent assist if needed, and pt can stay with her) Discharge Plan Discussed with Primary Caregiver: Yes Is Caregiver In Agreement with Plan?: Yes Does Caregiver/Family have Issues with Lodging/Transportation while Pt is in Rehab?: No   Goals Patient/Family Goal for Rehab: PT/OT mod I, SLP n/a Expected length of  stay: 9-12 days Additional Information: discharge plan: mod I goals, can d/c to her apartment if safe to be completely independent, or can d/c to mom's home (confirmed) where she will have some more support/supervision if needed Pt/Family Agrees to Admission and willing to participate: Yes Program Orientation Provided & Reviewed with Pt/Caregiver Including Roles  & Responsibilities: Yes  Barriers to Discharge: Decreased caregiver support   Decrease burden of Care through IP rehab admission: n/a   Possible need for SNF placement upon discharge: Not anticipated. D/C either to pt's home independently or to mom's home with intermittent supervision.    Patient Condition: I have reviewed medical records from [facility], spoken with CM, and patient and family member. I met with patient at the bedside and discussed via phone for inpatient rehabilitation assessment.  Patient will benefit from ongoing PT and OT, can actively participate in 3 hours of therapy a day 5 days of the week, and can make measurable gains during the admission.  Patient will also benefit from the coordinated team approach during an Inpatient Acute Rehabilitation admission.  The patient will receive intensive therapy as well as Rehabilitation physician, nursing, social worker, and care management interventions.  Due to safety, skin/wound care, disease management, medication administration, pain management, and patient education the patient requires 24 hour a day rehabilitation nursing.  The patient is currently min assist with mobility and basic ADLs.  Discharge setting and therapy post discharge at home with home health is anticipated.  Patient has agreed to participate in the Acute Inpatient Rehabilitation Program and will admit today.   Preadmission Screen Completed By:  Michel Santee, PT, DPT 02/25/2022 1:41 PM ______________________________________________________________________   Discussed status with Dr. Naaman Plummer on 02/25/22  at  1:41 PM  and received approval for admission today.   Admission Coordinator:  Michel Santee, PT, time 1:41 PM Sudie Grumbling 02/25/22     Assessment/Plan: Diagnosis: debility after sepsis and acute respiratory failure (status asthmaticus)  Does the need for close, 24 hr/day Medical supervision in concert with the patient's rehab needs make it unreasonable for this patient to be served in a less intensive setting? Yes Co-Morbidities requiring supervision/potential complications: asthma, obesity Due to bladder management, bowel management, safety, skin/wound care, disease management, medication administration, pain management, and patient education, does the patient require 24 hr/day rehab nursing? Yes Does the patient require coordinated care of a physician, rehab nurse, PT, OT to  address physical and functional deficits in the context of the above medical diagnosis(es)? Yes Addressing deficits in the following areas: balance, endurance, locomotion, strength, transferring, bowel/bladder control, bathing, dressing, feeding, grooming, toileting, and psychosocial support Can the patient actively participate in an intensive therapy program of at least 3 hrs of therapy 5 days a week? Yes The potential for patient to make measurable gains while on inpatient rehab is excellent Anticipated functional outcomes upon discharge from inpatient rehab: modified independent PT, modified independent OT, n/a SLP Estimated rehab length of stay to reach the above functional goals is: 5-7 days Anticipated discharge destination: Home 10. Overall Rehab/Functional Prognosis: excellent     MD Signature: Meredith Staggers, MD, [place] Director Rehabilitation Services 02/25/2022

## 2022-02-28 NOTE — Progress Notes (Signed)
Occupational Therapy Session Note  Patient Details  Name: Kaitlyn Mcneil MRN: [id number] Date of Birth: 01-03-1996  Today's Date: 02/28/2022 OT Individual Time: [id number] OT Individual Time Calculation (min): 55 min    Short Term Goals: Week 1:  OT Short Term Goal 1 (Week 1): STG=LTG d/t Pt ELOS  Skilled Therapeutic Interventions/Progress Updates:    Pt greeted semi-reclined in bed and agreeable to OT treatment session. Pt reported the water here was breaking her skin out and requested distilled water to bathe in. OT provided pt with distilled water for shower. Pt bathed in shower using bottles of distilled water mod I. All BADL tasks completed at mod I level, bathing/dressing/toileting/grooming tasks.. Pt ambulated to therapy apartment and practiced tub shower transfer mod I. Educated on general safety awareness and energy conservation. Pt handoff to PT for next therapy session.   Therapy Documentation Precautions:  Precautions Precautions: Fall Precaution Comments: fall risk increases with fatigue Restrictions Weight Bearing Restrictions: No Pain: Pain Assessment Pain Scale: 0-10 Pain Score: 0-No pain    Therapy/Group: Individual Therapy  Valma Cava 02/28/2022, 10:15 AM

## 2022-02-28 NOTE — Progress Notes (Signed)
Physical Therapy Discharge Summary  Patient Details  Name: Kaitlyn Mcneil MRN: [id number] Date of Birth: [birth year]  Date of Discharge from PT service:February 28, 2022   Patient has met 5 of 5 long term goals due to improved activity tolerance, improved balance, improved postural control, and increased strength.  Patient to discharge at an ambulatory level Independent.   Reasons goals not met: NA  Recommendation:  Patient will benefit from ongoing skilled PT services in outpatient setting to continue to advance safe functional mobility, address ongoing impairments in high level balance, endurance, requirements for job tasks, and minimize fall risk.  Equipment: No equipment provided  Reasons for discharge: treatment goals met and discharge from hospital  Patient/family agrees with progress made and goals achieved: Yes  PT Discharge Precautions/Restrictions Precautions Precautions: None Restrictions Weight Bearing Restrictions: No Pain Interference Pain Interference Pain Effect on Sleep: 1. Rarely or not at all Pain Interference with Therapy Activities: 1. Rarely or not at all Pain Interference with Day-to-Day Activities: 1. Rarely or not at all Vision/Perception  Vision - History Baseline Vision: Wears glasses Ability to See in Adequate Light: 1 Impaired Perception Perception: Within Functional Limits Praxis Praxis: Intact  Cognition Arousal/Alertness: Awake/alert Orientation Level: Oriented X4 Sensation Sensation Light Touch: Appears Intact Motor  Motor Motor: Within Functional Limits  Mobility Bed Mobility Bed Mobility: Supine to Sit;Sit to Supine Supine to Sit: Independent Sit to Supine: Independent Transfers Transfers: Sit to [employer] Transfers Sit to Stand: Independent Stand Pivot Transfers: Independent Transfer (Assistive device): None Locomotion  Gait Ambulation: Yes Gait Assistance: Independent Gait Distance (Feet): 500  Feet Assistive device: None Gait Gait: Yes Stairs / Additional Locomotion Stairs: Yes Stairs Assistance: Independent Stair Management Technique: No rails;One rail Right Number of Stairs: 12 Ramp: Independent Curb: Independent Pick up small object from the floor assist level: Independent Pick up small object from the floor assistive device: none Wheelchair Mobility Wheelchair Mobility: No  Trunk/Postural Assessment  Cervical Assessment Cervical Assessment: Within Functional Limits Thoracic Assessment Thoracic Assessment: Within Functional Limits Lumbar Assessment Lumbar Assessment: Within Functional Limits Postural Control Postural Control: Within Functional Limits  Balance Balance Balance Assessed: Yes Berg Balance Test Sit to Stand: Able to stand without using hands and stabilize independently Standing Unsupported: Able to stand safely 2 minutes Sitting with Back Unsupported but Feet Supported on Floor or Stool: Able to sit safely and securely 2 minutes Stand to Sit: Sits safely with minimal use of hands Transfers: Able to transfer safely, minor use of hands Standing Unsupported with Eyes Closed: Able to stand 10 seconds safely Standing Ubsupported with Feet Together: Able to place feet together independently and stand 1 minute safely From Standing, Reach Forward with Outstretched Arm: Can reach confidently >25 cm (10") From Standing Position, Pick up Object from Floor: Able to pick up shoe safely and easily From Standing Position, Turn to Look Behind Over each Shoulder: Looks behind from both sides and weight shifts well Turn 360 Degrees: Able to turn 360 degrees safely in 4 seconds or less Standing Unsupported, Alternately Place Feet on Step/Stool: Able to stand independently and safely and complete 8 steps in 20 seconds Standing Unsupported, One Foot in Front: Able to place foot tandem independently and hold 30 seconds Standing on One Leg: Able to lift leg independently  and hold > 10 seconds Total Score: 56 Extremity Assessment  RLE Assessment RLE Assessment: Within Functional Limits LLE Assessment LLE Assessment: Within Functional Limits   Breck Coons, PT, DPT 02/28/2022, 3:54 PM

## 2022-02-28 NOTE — Progress Notes (Signed)
Occupational Therapy Discharge Summary  Patient Details  Name: Kaitlyn Mcneil MRN: [id number] Date of Birth: [birth year]  Date of Discharge from OT service:February 28, 2022    Patient has met 8 of 8 long term goals due to improved activity tolerance, improved balance, postural control, and ability to compensate for deficits.  Patient to discharge at overall Independent level.      Reasons goals not met: n/a  Recommendation:  No OT follow up recommended  Equipment: No equipment provided  Reasons for discharge: refusal of 3 consecutive treatment sessions without medical reason and change in medical status  Patient/family agrees with progress made and goals achieved: Yes  OT Discharge Precautions/Restrictions  Precautions Precautions: None Pain  Denies pain ADL ADL Eating: Independent Where Assessed-Eating: Edge of bed Grooming: Independent Where Assessed-Grooming: Standing at sink Upper Body Bathing: Independent Where Assessed-Upper Body Bathing: Shower Lower Body Bathing: Independent Where Assessed-Lower Body Bathing: Shower Upper Body Dressing: Independent Where Assessed-Upper Body Dressing: Chair Lower Body Dressing: Independent Where Assessed-Lower Body Dressing: Chair Toileting: Independent Where Assessed-Toileting: Glass blower/designer: Programmer, applications Method: Counselling psychologist: Energy manager: Modified independent Clinical cytogeneticist Method: Optometrist: Energy manager: Modified independent Social research officer, government Method: Heritage manager: Grab bars, Administrator, Civil Service Overall Cognitive Status: Within Functional Limits for tasks assessed Arousal/Alertness: Awake/alert Orientation Level: Person;Place;Situation Person: Oriented Place: Oriented Situation: Oriented Memory: Appears intact Focused Attention: Appears  intact Sustained Attention: Appears intact Selective Attention: Appears intact Divided Attention: Appears intact Awareness: Appears intact Problem Solving: Appears intact Safety/Judgment: Appears intact Brief Interview for Mental Status (BIMS) Repetition of Three Words (First Attempt): 3 Temporal Orientation: Year: Correct Temporal Orientation: Month: Accurate within 5 days Temporal Orientation: Day: Correct Recall: "Sock": Yes, no cue required Recall: "Blue": Yes, no cue required Recall: "Bed": Yes, no cue required BIMS Summary Score: 15 Sensation Sensation Light Touch: Appears Intact Hot/Cold: Appears Intact Mobility  Bed Mobility Supine to Sit: Independent Sit to Supine: Independent Transfers Sit to Stand: Independent  Balance Static Sitting Balance Static Sitting - Level of Assistance: 7: Independent Dynamic Sitting Balance Dynamic Sitting - Level of Assistance: 7: Independent Static Standing Balance Static Standing - Level of Assistance: 7: Independent Dynamic Standing Balance Dynamic Standing - Level of Assistance: 7: Independent Extremity/Trunk Assessment RUE Assessment Active Range of Motion (AROM) Comments: WFL LUE Assessment LUE Assessment: Within Functional Limits   Daneen Schick Lauran Romanski 02/28/2022, 3:17 PM

## 2022-02-28 NOTE — Progress Notes (Signed)
Occupational Therapy Session Note  Patient Details  Name: Kaitlyn Mcneil MRN: [id number] Date of Birth: 1995/01/23  Today's Date: 02/28/2022 OT Individual Time: 1410-1440 OT Individual Time Calculation (min): 30 min   Skilled Therapeutic Interventions/Progress Updates: Patient received finishing lunch. Agreeable to OT therapy. Patient reports she may discharge tomorrow and had several questions regarding follow up appointments and care. Discussed information often given at discharge and follow up care. Patient feels good with functional levels, but admits to needing to familiarize herself with medication changes. Patient ambulated from room to front elevator while discussing continuum of care and steps to improve endurance so she can return to work and hobbies. Following walk worked on Dietitian and endurance skills with sit to stand and reaching to high shelf and squatting down to reach low shelf. Patient reports each activity challenging her endurance, but was not visibly SOB. Patient encouraged to take breaks and set boundaries at work to ensure good respiratory endurance as she returns home to her usual routine. Continue with skilled OT POC to assist patient in return to PLOF.     Therapy Documentation Precautions:  Precautions Precautions: None Precaution Comments: fall risk increases with fatigue Restrictions Weight Bearing Restrictions: No    Pain:no c/o pain   Therapy/Group: Individual Therapy  Hermina Barters 02/28/2022, 2:55 PM

## 2022-02-28 NOTE — Plan of Care (Signed)
  Problem: RH Balance Goal: LTG Patient will maintain dynamic standing with ADLs (OT) Description: LTG:  Patient will maintain dynamic standing balance with assist during activities of daily living (OT)  Outcome: Completed/Met   Problem: RH Bathing Goal: LTG Patient will bathe all body parts with assist levels (OT) Description: LTG: Patient will bathe all body parts with assist levels (OT) Outcome: Completed/Met   Problem: RH Toileting Goal: LTG Patient will perform toileting task (3/3 steps) with assistance level (OT) Description: LTG: Patient will perform toileting task (3/3 steps) with assistance level (OT)  Outcome: Completed/Met   Problem: RH Full Meal Prep Goal: LTG Patient will perform full meal prep w/assist (OT) Description: LTG: Patient will perform full meal prep with assistance, with/without cues (OT). Outcome: Completed/Met   Problem: RH Laundry Goal: LTG Patient will perform laundry w/assist, cues (OT) Description: LTG: Patient will perform laundry with assistance, with/without cues (OT). Outcome: Completed/Met   Problem: RH Light Housekeeping Goal: LTG Patient will perform light housekeeping w/assist (OT) Description: LTG: Patient will perform light housekeeping with assistance, with/without cues (OT). Outcome: Completed/Met   Problem: RH Toilet Transfers Goal: LTG Patient will perform toilet transfers w/assist (OT) Description: LTG: Patient will perform toilet transfers with assist, with/without cues using equipment (OT) Outcome: Completed/Met   Problem: RH Tub/Shower Transfers Goal: LTG Patient will perform tub/shower transfers w/assist (OT) Description: LTG: Patient will perform tub/shower transfers with assist, with/without cues using equipment (OT) Outcome: Completed/Met

## 2022-02-28 NOTE — IPOC Note (Signed)
Overall Plan of Care [facility]) Patient Details Name: Kaitlyn Mcneil MRN: [id number] DOB: [birth year]  Admitting Diagnosis: Respiratory failure [facility])  Hospital Problems: Principal Problem:   Respiratory failure ([place])     Functional Problem List: Nursing Endurance, Safety, Skin Integrity  PT Balance, Endurance  OT Balance, Safety, Endurance, Motor  SLP    TR         Basic ADL's: OT Bathing, Toileting, Dressing     Advanced  ADL's: OT Full Meal Preparation, Laundry, Light Housekeeping     Transfers: PT Bed to Chair, Administrator, arts, Toilet     Locomotion: PT Ambulation, Stairs     Additional Impairments: OT    SLP        TR      Anticipated Outcomes Item Anticipated Outcome  Self Feeding Independent  Swallowing      Basic self-care  Independent  Toileting  Independent   Bathroom Transfers Independent  Bowel/Bladder  continent x 2 maintain continence  Transfers  Independent  Locomotion  Independent  Communication     Cognition     Pain  maintain a pain goal under 3/10 on a 0-10 scale  Safety/Judgment  remain fall free   Therapy Plan: PT Intensity: Minimum of 1-2 x/day ,45 to 90 minutes PT Frequency: 5 out of 7 days PT Duration Estimated Length of Stay: 5-7 days OT Intensity: Minimum of 1-2 x/day, 45 to 90 minutes OT Frequency: 5 out of 7 days OT Duration/Estimated Length of Stay: 5-7 days     Team Interventions: Nursing Interventions Discharge Planning, Psychosocial Support, Patient/Family Education  PT interventions Ambulation/gait training, Discharge planning, Community reintegration, Barrister's clerk education, IT trainer, Therapeutic Activities, Therapeutic Exercise, UE/LE Strength taining/ROM  OT Interventions Training and development officer, Discharge planning, Pain management, Self Care/advanced ADL retraining, Therapeutic Activities, UE/LE Coordination activities, Disease mangement/prevention, Functional mobility training,  Patient/family education, Therapeutic Exercise, Community reintegration, Engineer, drilling, Neuromuscular re-education, Psychosocial support, UE/LE Strength taining/ROM  SLP Interventions    TR Interventions    SW/CM Interventions Discharge Planning, Psychosocial Support, Patient/Family Education   Barriers to Discharge MD  Medical stability and Home enviroment access/loayout  Nursing Inaccessible home environment 1 level with level  PT Home environment access/layout stairs  OT      SLP      SW       Team Discharge Planning: Destination: PT-Home ,OT- Home , SLP-  Projected Follow-up: PT-Outpatient PT (for continued support to return to vocation level mobility), OT-  None, SLP-  Projected Equipment Needs: PT-To be determined, OT- To be determined, SLP-  Equipment Details: PT- , OT-  Patient/family involved in discharge planning: PT- Patient,  OT-Patient, Family member/caregiver, SLP-   MD ELOS: 5-7 days Medical Rehab Prognosis:  Excellent Assessment: The patient has been admitted for CIR therapies with the diagnosis of debility due to respiratory failure. The team will be addressing functional mobility, strength, stamina, balance, safety, adaptive techniques and equipment, self-care, patient and caregiver education. Goals have been set at Ind. Anticipated discharge destination is home.       See Team Conference Notes for weekly updates to the plan of care

## 2022-02-28 NOTE — Progress Notes (Signed)
[facility] Individual Statement of Services  Patient Name:  Kaitlyn Mcneil  Date:  02/28/2022  Welcome to the [facility].  Our goal is to provide you with an individualized program based on your diagnosis and situation, designed to meet your specific needs.  With this comprehensive rehabilitation program, you will be expected to participate in at least 3 hours of rehabilitation therapies Monday-Friday, with modified therapy programming on the weekends.  Your rehabilitation program will include the following services:  Physical Therapy (PT), Occupational Therapy (OT), 24 hour per day rehabilitation nursing, Therapeutic Recreaction (TR), Care Coordinator, Rehabilitation Medicine, Nutrition Services, and Pharmacy Services  Weekly team conferences will be held on Tuesday to discuss your progress.  Your Inpatient Rehabilitation Care Coordinator will talk with you frequently to get your input and to update you on team discussions.  Team conferences with you and your family in attendance may also be held.  Expected length of stay: 3-5 days  Overall anticipated outcome: independent level  Depending on your progress and recovery, your program may change. Your Inpatient Rehabilitation Care Coordinator will coordinate services and will keep you informed of any changes. Your Inpatient Rehabilitation Care Coordinator's name and contact numbers are listed  below.  The following services may also be recommended but are not provided by the [facility] will be made to provide these services after discharge if needed.  Arrangements include referral to agencies that provide these services.  Your insurance has been verified to be:  [place] Your primary doctor is:  None  Pertinent information will be shared with your  doctor and your insurance company.  Inpatient Rehabilitation Care Coordinator:  Ovidio Kin, [place] or Emilia Beck  Information discussed with and copy given to patient by: Elease Hashimoto, 02/28/2022, 11:58 AM

## 2022-02-28 NOTE — Patient Care Conference (Signed)
Inpatient RehabilitationTeam Conference and Plan of Care Update Date: 02/28/2022   Time: 09:38 AM   Patient Name: Kaitlyn Mcneil      Medical Record Number: [id number]  Date of Birth: 1995-02-08 Sex: Female         Room/Bed: 4M01C/4M01C-01 Payor Info: Payor: [place] / Plan: BCBS COMM PPO / Product Type: *No Product type* /    Admit Date/Time:  02/25/2022  5:27 PM  Primary Diagnosis:  Respiratory failure [facility])  Hospital Problems: Principal Problem:   Respiratory failure [facility])    Expected Discharge Date: Expected Discharge Date: 03/01/22  Team Members Present: Physician leading conference: Dr. Durel Salts Social Worker Present: Ovidio Kin, LCSW Nurse Present: Tacy Learn, RN PT Present: Tereasa Coop, PT OT Present: Cherylynn Ridges, OT     Current Status/Progress Goal Weekly Team Focus  Bowel/Bladder     Continent B/B *** Remain continent of B/B  Toilet every 4 hours and PRN    Swallow/Nutrition/ Hydration      ***         ADL's      ***          Mobility      ***         Communication      ***          Safety/Cognition/ Behavioral Observations     ***          Pain     Denies pain *** Remain less than 4 out 10 pain with PRN   Assess every 4 hours and PRN    Skin    Skin is CDI *** Remain intact and free of breakdown.   Assess every shift and PRN      Discharge Planning:      Team Discussion: Respiratory failure. Continent B/B. Denies pain. Skin is CDI.  Patient on target to meet rehab goals: {IP REHAB YES/NO WITH [id number]  *See Care Plan and progress notes for long and short-term goals.   Revisions to Treatment Plan:  Needs PCP  Teaching Needs: Medications, safety, skin care, gait/transfer training, etc.   Current Barriers to Discharge: Decreased caregiver support  Possible Resolutions to Barriers: Family education, nursing education, no DME needed.      Medical Summary                I attest that I was present, lead the team conference, and concur with the assessment and plan of the team.   Ernest Pine 02/28/2022, 10:00 AM
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
## 2022-02-28 NOTE — Progress Notes (Signed)
Physical Therapy Session Note  Patient Details  Name: Kaitlyn Mcneil MRN: [id number] Date of Birth: [birth year]  Today's Date: 02/28/2022 PT Individual Time: 0940-1020 PT Individual Time Calculation (min): 40 min   Short Term Goals: Week 1:  PT Short Term Goal 1 (Week 1): STG = LTG 2/2 LOS  Skilled Therapeutic Interventions/Progress Updates:     Pt handed off to PT from OT in hallway, ambulating. Pt does not complain of pain and no SOB. Pt ambulates x750' independently. Pt takes brief seated rest break. Pt then performs simulated job duties, tasked with ambulating while holding onto large exercise ball to engage core and challenge balance, strength, and endurance. Pt completes without difficulty, so activity progressed by having pt ambulate while holding onto 2 9lb dumbbells. Pt verbalizes increase in difficulty but completes without assistance. Following seated rest break, pt ambulates again holding onto 9lb weights, but with added challenge of naming grocery store items with each letter of the alphabet to provide multitasking cognitive challenge. Pt ambulates x900' prior to requiring rest break, with min cueing for cognitive task. Following brief rest break, pt ambulates back to room independently. PT alerts RN that pt is now independent in room.  Therapy Documentation Precautions:  Precautions Precautions: Fall Precaution Comments: fall risk increases with fatigue Restrictions Weight Bearing Restrictions: No   Therapy/Group: Individual Therapy  Breck Coons, PT, DPT 02/28/2022, 12:17 PM

## 2022-02-28 NOTE — Progress Notes (Signed)
Inpatient Rehabilitation Discharge Medication Review by a Pharmacist  A complete drug regimen review was completed for this patient to identify any potential clinically significant medication issues.  High Risk Drug Classes Is patient taking? Indication by Medication  Antipsychotic No   Anticoagulant No   Antibiotic No   Opioid No   Antiplatelet No   Hypoglycemics/insulin No   Vasoactive Medication Yes Cardizem- HTN  Chemotherapy No   Other Yes Ferrous sulfate- anemia Claritin- seasonal allergies Dulera, singulair, spiriva- asthma     Type of Medication Issue Identified Description of Issue Recommendation(s)  Drug Interaction(s) (clinically significant)     Duplicate Therapy     Allergy     No Medication Administration End Date     Incorrect Dose     Additional Drug Therapy Needed     Significant med changes from prior encounter (inform family/care partners about these prior to discharge).    Other  PTA med: albuterol Unable to locate a prescription history through pharmacy records acquired through "care Everywhere". Patient can f/up with OP provider to discuss need for continued use at a later date    Clinically significant medication issues were identified that warrant physician communication and completion of prescribed/recommended actions by midnight of the next day:  No  Name of provider notified for urgent issues identified:   Provider Method of Notification:     Pharmacist comments:   Time spent performing this drug regimen review (minutes):  30   Jakeem Grape BS, PharmD, BCPS Clinical Pharmacist 03/01/2022 8:54 AM  Contact: [phone] after 3 PM  "Be curious, not judgmental..." -Jamal Maes

## 2022-02-28 NOTE — Discharge Summary (Signed)
Physician Discharge Summary  [name] MRN: [id number] DOB/AGE: 06-08-1995 27 y.o.  Admit date: 02/25/2022 Discharge date: 03/01/2022  Discharge Diagnoses:  Principal Problem:   Respiratory failure [facility]) Active problems: Debility secondary to acute respiratory failure Asthma Rhinovirus infection Tachycardia Anemia Prediabetes  Discharged Condition: {condition:18240}  Significant Diagnostic Studies: Labs:  Basic Metabolic Panel: Recent Labs  Lab 02/22/22 0248 02/23/22 0355 02/24/22 0307 02/26/22 0549 02/28/22 0645  NA 136 135 137 138 135  K 4.0 3.8 4.7 4.4 3.8  CL 101 100 106 105 104  CO2 26 25 17* 25 28  GLUCOSE 161* 160* 106* 105* 102*  BUN '13 18 17 13 13  '$ CREATININE 0.51 0.51 0.52 0.70 0.53  CALCIUM 8.9 9.0 9.1 9.3 9.2  MG 1.8 1.8  --   --   --   PHOS 4.6 4.6  --   --   --     CBC: Recent Labs  Lab 02/24/22 0307 02/26/22 0549 02/28/22 0645  WBC 15.2* 12.2* 11.9*  NEUTROABS 10.9* 8.3*  --   HGB 10.4* 9.5* 9.9*  HCT 31.6* 30.5* 29.9*  MCV 84.5 86.4 84.0  PLT 303 309 333    CBG: Recent Labs  Lab 02/26/22 0606 02/26/22 1127 02/26/22 1709 02/26/22 2052 02/27/22 0553  GLUCAP 111* 100* 116* 119* 108*    Brief HPI:   Kaitlyn Mcneil is a 27 y.o. female  who presented to [facility] ED on 02/07/2022 with 3 days history of acute SOB and wheezing. History positive for asthma. Endorsed fever and cough. Rapidly decompensated and required intubation , mechanical ventilation and PCCM.  Required deep sedation and neuromuscular blockade, steroids. Placed on Cleviprex for hypertensive urgency. Lactic acidosis treated with bicarb. Required ketamine, magnesium. Hemodynamically unstable 2/05 despite aggressive care with maximum intervention. Levophed started. Cardiology consulted for ECMO. ECMO cannulation by Dr. Haroldine Laws on 2/06. Viral panel positive for rhinovirus. Cortrak placed on 2/07 started TF. ECMO decannulation on 2/08. Sedation weaned.  Diltiazem for sinus tachycardia. Extubated on 2/15. Transferred to [facility] service. Nocturnal TF started. Foley removed and voiding spontaneously. Diet advance and Cortrak removed today, 2/23. Discontinue SSI and provide carb modified diet for pre-diabetes. She tolerated breakfast and lunch. Requires min guard assist for mobility.    Hospital Course: Kaitlyn Mcneil was admitted to rehab 02/25/2022 for inpatient therapies to consist of PT, ST and OT at least three hours five days a week. Past admission physiatrist, therapy team and rehab RN have worked together to provide customized collaborative inpatient rehab.  Capillary blood glucoses were within range and her sliding scale insulin was discontinued she was placed on diabetic diet.  Discussed mildly elevated A1c consistent with prediabetes.  Respiratory status remained controlled on bronchodilators, antihistamine with normal oxygen saturations.  Did not require nebulizer treatment.  Tracheostomy site continues to heal.  Oral iron given 3 times weekly for anemia.   Blood pressures were monitored on TID basis and tachycardia remained controlled on Diltiazem 60 mg 4 times daily.  Rehab course: During patient's stay in rehab weekly team conferences were held to monitor patient's progress, set goals and discuss barriers to discharge. At admission, patient required   She  has had improvement in activity tolerance, balance, postural control as well as ability to compensate for deficits. He/She has had improvement in functional use RUE/LUE  and RLE/LLE as well as improvement in awareness   Disposition:  There are no questions and answers to display.  Diet: Carb modified  Special Instructions: No driving, alcohol consumption or tobacco use.  Avoid beta-blocker use due to severe asthma.  30-35 minutes were spent on discharge planning and discharge summary.   Allergies as of 02/28/2022       Reactions   Beta Adrenergic Blockers  Shortness Of Breath   Severe wheezing   Shellfish Allergy Swelling   Throat, facial swelling   Tomato Itching, Other (See Comments)   Eczema flares     Med Rec must be completed prior to using this [facility]***       Follow-up Information     Durel Salts C, DO Follow up.   Specialty: Physical Medicine and Rehabilitation Why: As needed Contact information: [address] [place] [address] [phone]                 Signed: Barbie Banner 02/28/2022, 9:54 AM
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
## 2022-02-28 NOTE — Discharge Instructions (Addendum)
Inpatient Rehab Discharge Instructions  Kaitlyn Mcneil [place] Discharge date and time: 03/01/2022  Activities/Precautions/ Functional Status: Activity: no lifting, driving, or strenuous exercise until cleared by MD Diet: regular diet Wound Care: keep wound clean and dry Functional status:  ___ No restrictions     ___ Walk up steps independently ___ 24/7 supervision/assistance   ___ Walk up steps with assistance _x__ Intermittent supervision/assistance  ___ Bathe/dress independently ___ Walk with walker     ___ Bathe/dress with assistance ___ Walk Independently    ___ Shower independently ___ Walk with assistance    __x_ Shower with assistance __x_ No alcohol     ___ Return to work/school ________  Special Instructions: No driving, alcohol consumption or tobacco use.   COMMUNITY REFERRALS UPON DISCHARGE:    Outpatient: PT             Agency: [facility]  [place] [id number]              Appointment Date/Time:WILL CALL YOU TO SET UP FOLLOW UP APPOINTMENT  Medical Equipment/Items Ordered:NO NEEDS                                                 Agency/Supplier:NA   My questions have been answered and I understand these instructions. I will adhere to these goals and the provided educational materials after my discharge from the hospital.  Patient/Caregiver Signature _______________________________ Date __________  Clinician Signature _______________________________________ Date __________  Please bring this form and your medication list with you to all your follow-up doctor's appointments.

## 2022-02-28 NOTE — Progress Notes (Signed)
Physical Therapy Session Note  Patient Details  Name: Kaitlyn Mcneil MRN: [id number] Date of Birth: 07-23-1995  Today's Date: 02/28/2022 PT Individual Time: [id number] PT Individual Time Calculation (min): 70 min   Short Term Goals: Week 1:  PT Short Term Goal 1 (Week 1): STG = LTG 2/2 LOS  Skilled Therapeutic Interventions/Progress Updates:  Chart reviewed and pt agreeable to therapy. Pt received semi-reclined in bed with no c/o pain. Session focused on discharge planning, amb endurance and stair training to promote safe home access, and strength training to promote return to work. Pt initiated session with review of functional mobility. Pt then completed amb of 38mns using supervision  + no AD around 4th and 1st floors. Pt then completed 4x4 steps with unilateral rail and 1x4 steps with no rails both independently. Pt then completed lifting and carrying 10#-15# in bucket for 10 rounds. Pt then returned to room to review squat exercise for continuation after d/c. Pt then completed 12 mins on NuStep for interval training at easy-med-hard levels (workloads 5-10). Pt and PT returned to room where PT discussed continued OP PT with family and answered questions. Pt previously cleared to amb independently around room, so at end of session pt was left semi-reclined in bed with nurse call bell and all needs in reach.      Therapy Documentation Precautions:  Precautions Precautions: None Precaution Comments: fall risk increases with fatigue Restrictions Weight Bearing Restrictions: No   Therapy/Group: Individual Therapy  KMarquette Old PT, DPT 02/28/2022, 12:02 PM

## 2022-03-01 ENCOUNTER — Other Ambulatory Visit (HOSPITAL_COMMUNITY): Payer: Self-pay

## 2022-03-01 MED ORDER — MOMETASONE FURO-FORMOTEROL FUM 200-5 MCG/ACT IN AERO
2.0000 | INHALATION_SPRAY | Freq: Two times a day (BID) | RESPIRATORY_TRACT | 0 refills | Status: DC
  Filled 2022-03-01: qty 13, 30d supply, fill #0

## 2022-03-01 MED ORDER — DILTIAZEM HCL 60 MG PO TABS
60.0000 mg | ORAL_TABLET | Freq: Four times a day (QID) | ORAL | 0 refills | Status: DC
  Filled 2022-03-01: qty 120, 30d supply, fill #0

## 2022-03-01 MED ORDER — FERROUS SULFATE 325 (65 FE) MG PO TABS
325.0000 mg | ORAL_TABLET | ORAL | 0 refills | Status: DC
  Filled 2022-03-01: qty 12, 28d supply, fill #0

## 2022-03-01 MED ORDER — MONTELUKAST SODIUM 10 MG PO TABS
10.0000 mg | ORAL_TABLET | Freq: Every day | ORAL | 0 refills | Status: DC
  Filled 2022-03-01: qty 30, 30d supply, fill #0

## 2022-03-01 MED ORDER — ADULT MULTIVITAMIN W/MINERALS CH: 1.0000 | ORAL_TABLET | Freq: Every day | ORAL | Status: DC

## 2022-03-01 MED ORDER — ACETAMINOPHEN 325 MG PO TABS: 325.0000 mg | ORAL_TABLET | ORAL | Status: DC | PRN

## 2022-03-01 MED ORDER — SPIRIVA RESPIMAT 2.5 MCG/ACT IN AERS
1.0000 | INHALATION_SPRAY | Freq: Every day | RESPIRATORY_TRACT | 0 refills | Status: DC
  Filled 2022-03-01: qty 4, 30d supply, fill #0

## 2022-03-01 MED ORDER — LORATADINE 10 MG PO TABS
10.0000 mg | ORAL_TABLET | Freq: Every day | ORAL | 0 refills | Status: DC
  Filled 2022-03-01: qty 30, 30d supply, fill #0

## 2022-03-01 NOTE — Progress Notes (Signed)
Sutures removed from right side of neck. Foam dressing applied.
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
## 2022-03-01 NOTE — Progress Notes (Signed)
Inpatient Rehabilitation Care Coordinator Discharge Note   Patient Details  Name: Kaitlyn Mcneil MRN: [id number] Date of Birth: 1995-07-04   Discharge location: HOME WITH MOM FOR A FEW DAYS THEN BACK TO HER APARTMENT  Length of Stay: 4 DAYS  Discharge activity level: INDEPENDENT LEVEL  Home/community participation: ACTIVE  Patient response [id number] Literacy - How often do you need to have someone help you when you read instructions, pamphlets, or other written material from your doctor or pharmacy?: Never  Patient response [id number] Isolation - How often do you feel lonely or isolated from those around you?: Never  Services provided included: MD, RD, PT, OT, SLP, RN, CM, Pharmacy, SW  Financial Services:  Financial Services Utilized: [place] offered to/list presented to: PT  Follow-up services arranged:  Outpatient    Outpatient Servicies: [facility]OP[facility] WILL CALL TO SET UP APPOINTMENT  NO EQUIPMENT NEEDED. SET UP PCP APPOINTMENT FOR PT AND GAVE HER PAPER WORK FOR THIS     Patient response to transportation need: Is the patient able to respond to transportation needs?: Yes In the past 12 months, has lack of transportation kept you from medical appointments or from getting medications?: No In the past 12 months, has lack of transportation kept you from meetings, work, or from getting things needed for daily living?: No    Comments (or additional information):PT Tees Toh. GOING TO [place].   Patient/Family verbalized understanding of follow-up arrangements:  Yes  Individual responsible for coordination of the follow-up plan: SELF AND JOHNEATTE-MOM [phone]  Confirmed correct DME delivered: Elease Hashimoto 03/01/2022    Hosanna Betley, Gard[place] Rhyme

## 2022-03-03 ENCOUNTER — Ambulatory Visit: Payer: BC Managed Care – PPO | Attending: Physician Assistant | Admitting: Physical Therapy

## 2022-03-03 ENCOUNTER — Encounter: Payer: Self-pay | Admitting: Physical Therapy

## 2022-03-03 ENCOUNTER — Other Ambulatory Visit: Payer: Self-pay

## 2022-03-03 VITALS — BP 115/71 | HR 86

## 2022-03-03 DIAGNOSIS — M6281 Muscle weakness (generalized): Secondary | ICD-10-CM | POA: Diagnosis not present

## 2022-03-03 DIAGNOSIS — R2681 Unsteadiness on feet: Secondary | ICD-10-CM | POA: Diagnosis not present

## 2022-03-03 NOTE — Therapy (Signed)
OUTPATIENT PHYSICAL THERAPY NEURO EVALUATION   Patient Name: Kaitlyn Mcneil MRN: [id number] DOB:February 01, 1995, 27 y.o., female Today's Date: 03/03/2022   PCP: Amelia Jo (sees 03/04/2022 at [facility]) REFERRING PROVIDER: Barbie Banner, PA-C  END OF SESSION:  PT End of Session - 03/03/22 1455     Visit Number 1    Number of Visits 9   8+eval   Date for PT Re-Evaluation 05/06/22   pt schedule conflicts   Authorization Type BLUE CROSS BLUE SHIELD    PT Start Time [id number]    PT Stop Time 1532    PT Time Calculation (min) 47 min    Equipment Utilized During Treatment Gait belt    Behavior During Therapy WFL for tasks assessed/performed             Past Medical History:  Diagnosis Date   Asthma    Past Surgical History:  Procedure Laterality Date   CANNULATION FOR ECMO (EXTRACORPOREAL MEMBRANE OXYGENATION) N/A 02/07/2022   Procedure: CANNULATION FOR ECMO (EXTRACORPOREAL MEMBRANE OXYGENATION);  Surgeon: Jolaine Artist, MD;  Location: [facility];  Service: Cardiovascular;  Laterality: N/A;   ECMO CANNULATION N/A 02/07/2022   Procedure: ECMO CANNULATION;  Surgeon: Jolaine Artist, MD;  Location: [facility];  Service: Cardiovascular;  Laterality: N/A;   TEE WITHOUT CARDIOVERSION N/A 02/07/2022   Procedure: TRANSESOPHAGEAL ECHOCARDIOGRAM;  Surgeon: Jolaine Artist, MD;  Location: [facility];  Service: Cardiovascular;  Laterality: N/A;   Patient Active Problem List   Diagnosis Date Noted   Respiratory failure ([place]) 02/25/2022   Severe sepsis ([place]) 02/20/2022   Sinus tachycardia 02/20/2022   Delirium 02/20/2022   Generalized weakness 02/20/2022   Obesity (BMI 30-39.9) 02/20/2022   Lactic acidosis 02/20/2022   Severe persistent asthma with status asthmaticus 02/18/2022   Acute respiratory failure with hypoxia and hypercapnia (HCC) [id number]   Acute metabolic encephalopathy [id number]   Acute respiratory distress 02/07/2022    ONSET DATE:  02/07/2022  REFERRING DIAG: J96.90 (ICD-10-CM) - Respiratory failure, unspecified, unspecified whether with hypoxia or hypercapnia  THERAPY DIAG:  Muscle weakness (generalized)  Unsteadiness on feet  Rationale for Evaluation and Treatment: Rehabilitation  SUBJECTIVE:                                                                                                                                                                                             SUBJECTIVE STATEMENT: Endurance, arm strength and walking long distances or times have been difficult. Pt accompanied by: self  PERTINENT HISTORY: asthma  *well controlled medically, not exercise induced.  "presented to [facility] ED on 02/07/2022 with 3  days history of acute SOB and wheezing. History positive for asthma. Endorsed fever and cough. Rapidly decompensated and required intubation , mechanical ventilation and PCCM.  Required deep sedation and neuromuscular blockade, steroids. Placed on Cleviprex for hypertensive urgency. Lactic acidosis treated with bicarb. Required ketamine, magnesium. Hemodynamically unstable 2/05 despite aggressive care with maximum intervention. Levophed started. Cardiology consulted for ECMO. ECMO cannulation by Dr. Haroldine Laws on 2/06. Viral panel positive for rhinovirus. Cortrak placed on 2/07 started TF. ECMO decannulation on 2/08. Sedation weaned. Diltiazem for sinus tachycardia. Extubated on 2/15. Transferred to [facility] service. Nocturnal TF started. Foley removed and voiding spontaneously. Diet advance and Cortrak removed today, 2/23. Discontinue SSI and provide carb modified diet for pre-diabetes. She tolerated breakfast and lunch. Requires min guard assist for mobility."  Admitted to rehab 02/25/2022-03/01/2022.  PAIN:  Are you having pain? No-sharp pain in right midfoot and lateral calf when working out or moving foot  PRECAUTIONS: Fall and Other: decannulated (ECMO) on right neck, covered w/ gauze-no external  stitches  WEIGHT BEARING RESTRICTIONS: No  FALLS: Has patient fallen in last 6 months? Yes. Number of falls 2-at hospital, tripped over a wire and fell with a nursing assistant when using the RW  LIVING ENVIRONMENT: Lives with: lives alone Lives in: House/apartment Stairs: No Has following equipment at home: None  PLOF: Independent with homemaking with ambulation, Independent with gait, Independent with transfers, Needs assistance with ADLs, and was instructed to have assistance w/ ADLs until fully cleared.  PATIENT GOALS: "To get my endurance back, to do better with walking without fatigue, and get my arm strength back."  OBJECTIVE:   DIAGNOSTIC FINDINGS:  CT Chest: IMPRESSION: 1. Bilateral lower lobe opacities suggesting bibasilar pneumonia, left greater than the right. Small bilateral pleural effusions. 2. Endotracheal tube in place. Feeding tube coursing below the diaphragm projecting over the stomach  COGNITION: Overall cognitive status: Within functional limits for tasks assessed-pt stating a processing delay when trying to speak, requesting a ST referral   SENSATION: WFL  COORDINATION: LE RAMS:  WNL bilaterally Heel-to-shin:  WNL bilaterally  EDEMA:  None noted in BUE/BLE, pt denies any abnormal since being home.  MUSCLE TONE: none noted in BLE during functional assessments.  POSTURE: No Significant postural limitations  LOWER EXTREMITY ROM:     Active  Right Eval Left Eval  Hip flexion WNL  Hip extension   Hip abduction   Hip adduction   Hip internal rotation   Hip external rotation   Knee flexion   Knee extension   Ankle dorsiflexion   Ankle plantarflexion   Ankle inversion    Ankle eversion     (Blank rows = not tested)  LOWER EXTREMITY MMT:    MMT Right Eval Left Eval  Hip flexion Can stand against gravity and ambulate independently.  Hip extension   Hip abduction   Hip adduction   Hip internal rotation   Hip external rotation   Knee  flexion   Knee extension   Ankle dorsiflexion   Ankle plantarflexion   Ankle inversion    Ankle eversion    (Blank rows = not tested)  BED MOBILITY:  Pt reports independence.  TRANSFERS: Assistive device utilized: None  Sit to stand: Complete Independence Stand to sit: Complete Independence Chair to chair: Complete Independence  FUNCTIONAL TESTS:  30 seconds chair stand test:  13 w/o UE support 6 minute walk test: 575'; pt reporting light-headedness, SpO2 100%, HR 91bpm; + 250' after seated rest Functional gait assessment: 25/30  [facility] PT Assessment - 03/03/22 1524       Functional Gait  Assessment   Gait assessed  Yes    Gait Level Surface Walks 20 ft in less than 5.5 sec, no assistive devices, good speed, no evidence for imbalance, normal gait pattern, deviates no more than 6 in outside of the 12 in walkway width.    Change in Gait Speed Able to smoothly change walking speed without loss of balance or gait deviation. Deviate no more than 6 in outside of the 12 in walkway width.    Gait with Horizontal Head Turns Performs head turns smoothly with slight change in gait velocity (eg, minor disruption to smooth gait path), deviates 6-10 in outside 12 in walkway width, or uses an assistive device.    Gait with Vertical Head Turns Performs head turns with no change in gait. Deviates no more than 6 in outside 12 in walkway width.    Gait and Pivot Turn Pivot turns safely within 3 sec and stops quickly with no loss of balance.    Step Over Obstacle Is able to step over one shoe box (4.5 in total height) without changing gait speed. No evidence of imbalance.    Gait with Narrow Base of Support Is able to ambulate for 10 steps heel to toe with no staggering.    Gait with Eyes Closed Walks 20 ft, uses assistive device, slower speed, mild gait deviations, deviates 6-10 in outside 12 in walkway width. Ambulates 20 ft in less than 9 sec but greater than 7 sec.    Ambulating Backwards Walks 20  ft, uses assistive device, slower speed, mild gait deviations, deviates 6-10 in outside 12 in walkway width.    Steps Alternating feet, must use rail.    Total Score 25    FGA comment: 25/30 = low fall risk            PATIENT SURVEYS:  None relevant to patient's chief complaint.  TODAY'S TREATMENT:                                                                                                                              DATE: N/A    PATIENT EDUCATION: Education details: PT POC, assessments used and outcome interpretations, and goals to be set. Person educated: Patient Education method: Explanation Education comprehension: verbalized understanding  HOME EXERCISE PROGRAM: To be established.  GOALS: Goals reviewed with patient? Yes  SHORT TERM GOALS: Target date: 04/01/2022  Pt will be independent with initial HEP to promote strengthening, dynamic stability, and endurance. Baseline:  To be established. Goal status: INITIAL  LONG TERM GOALS: Target date: 04/29/2022  Pt will report return to aerobic activity at least 3 days per week to promote general activity tolerance. Baseline: Goes to gym x1 day per week. Goal status: INITIAL  2.  Pt will complete 16 chair stands w/o UE support in 30 seconds in order to demonstrate LE strength and power.  Baseline: 13 w/o UE support Goal status: INITIAL  3.  Pt will ambulate >/=800 feet continuously at independent level during 6MWT to demonstrate improved aerobic capacity. Baseline: 575' + 250' w/ seated rest Goal status: INITIAL  4.  Pt will improve FGA score to >/=28/30 in order to demonstrate improved balance and decreased fall risk. Baseline: 25/30 Goal status: INITIAL  ASSESSMENT:  CLINICAL IMPRESSION: Patient is a 27 y.o. female who was seen today for physical therapy evaluation and treatment for generalized weakness and debility following prolonged hospitalization for respiratory failure.  Pt has a significant PMH of  asthma.  Identified impairments include reported processing delay, moderately impaired endurance and mildly impaired dynamic stability.  Evaluation via the following assessment tools: 30 second chair stand and FGA indicate low fall risk.  She has notable impaired endurance with inability to ambulate continuously during 6MWT for age group.  She would benefit from skilled PT to address impairments as noted and progress towards long term goals.  OBJECTIVE IMPAIRMENTS: cardiopulmonary status limiting activity, decreased activity tolerance, and decreased endurance.   ACTIVITY LIMITATIONS: carrying, lifting, and locomotion level  PARTICIPATION LIMITATIONS: laundry, community activity, and occupation  PERSONAL FACTORS: Fitness, Past/current experiences, Sex, and 1-2 comorbidities: asthma  are also affecting patient's functional outcome.   REHAB POTENTIAL: Excellent  CLINICAL DECISION MAKING: Evolving/moderate complexity  EVALUATION COMPLEXITY: Moderate  PLAN:  PT FREQUENCY: 1x/week  PT DURATION: 8 weeks  PLANNED INTERVENTIONS: Therapeutic exercises, Therapeutic activity, Neuromuscular re-education, Balance training, Gait training, Patient/Family education, Self Care, Stair training, Vestibular training, and Re-evaluation  PLAN FOR NEXT SESSION: Initiate HEP and walking program, Treadmill training, LE strengthening, light lifting technique, is patient cleared to return to work by PCP?   Bary Richard, PT, DPT 03/03/2022, 5:14 PM

## 2022-03-04 ENCOUNTER — Telehealth: Payer: Self-pay | Admitting: Physical Therapy

## 2022-03-04 ENCOUNTER — Institutional Professional Consult (permissible substitution): Payer: Self-pay | Admitting: Pulmonary Disease

## 2022-03-04 DIAGNOSIS — Z8709 Personal history of other diseases of the respiratory system: Secondary | ICD-10-CM | POA: Diagnosis not present

## 2022-03-04 DIAGNOSIS — R7303 Prediabetes: Secondary | ICD-10-CM | POA: Diagnosis not present

## 2022-03-04 DIAGNOSIS — D649 Anemia, unspecified: Secondary | ICD-10-CM | POA: Diagnosis not present

## 2022-03-04 DIAGNOSIS — R4189 Other symptoms and signs involving cognitive functions and awareness: Secondary | ICD-10-CM

## 2022-03-04 DIAGNOSIS — R000 Tachycardia, unspecified: Secondary | ICD-10-CM | POA: Diagnosis not present

## 2022-03-04 DIAGNOSIS — J4550 Severe persistent asthma, uncomplicated: Secondary | ICD-10-CM | POA: Diagnosis not present

## 2022-03-04 NOTE — Telephone Encounter (Signed)
  Kaitlyn Mcneil was evaluated by PT on 03/03/2022.  The patient would benefit from speech therapy evaluation for reported brain fog and processing delay since recent hospitalization.   If you agree, please place an order in [facility]que in [facility] or fax the order to [phone]. Thank you, Elease Etienne, PT, [place] [address] [place], [place]  [address] Phone:  [phone] Fax:  [phone]

## 2022-03-04 NOTE — Addendum Note (Signed)
Addended by: Durel Salts on: 03/04/2022 03:18 PM   Modules accepted: Orders

## 2022-03-08 ENCOUNTER — Ambulatory Visit: Payer: BC Managed Care – PPO | Admitting: Physical Therapy

## 2022-03-11 ENCOUNTER — Ambulatory Visit: Payer: BC Managed Care – PPO | Admitting: Physical Therapy

## 2022-03-14 ENCOUNTER — Encounter: Payer: Self-pay | Admitting: Pulmonary Disease

## 2022-03-14 ENCOUNTER — Ambulatory Visit (INDEPENDENT_AMBULATORY_CARE_PROVIDER_SITE_OTHER): Payer: BC Managed Care – PPO | Admitting: Pulmonary Disease

## 2022-03-14 VITALS — BP 120/90 | HR 75 | Ht 60.0 in | Wt 152.8 lb

## 2022-03-14 DIAGNOSIS — J4552 Severe persistent asthma with status asthmaticus: Secondary | ICD-10-CM

## 2022-03-14 DIAGNOSIS — Z9281 Personal history of extracorporeal membrane oxygenation (ECMO): Secondary | ICD-10-CM | POA: Diagnosis not present

## 2022-03-14 MED ORDER — FEXOFENADINE HCL 60 MG PO TABS: 60.0000 mg | ORAL_TABLET | Freq: Two times a day (BID) | ORAL | 2 refills | Status: DC
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
## 2022-03-14 NOTE — Patient Instructions (Addendum)
Thank you for visiting Dr. Valeta Harms at [facility]. Today we recommend the following:  Start dupixent paperwork for approval Referral to Allergy  Continue Dulera and Incruse  Continue Singulair   Return in about 2 months (around 05/14/2022).    Please do your part to reduce the spread of COVID-19.

## 2022-03-15 ENCOUNTER — Ambulatory Visit: Payer: BC Managed Care – PPO | Admitting: Physical Therapy

## 2022-03-15 ENCOUNTER — Telehealth: Payer: Self-pay | Admitting: Pharmacist

## 2022-03-15 NOTE — Telephone Encounter (Signed)
Received new start paperwork for [place].  Paperwork placed in PAP pending info folder (if denied, may need to be enrolled into bridge program)  Submitted a Prior Authorization request to [employer] for [place] via CoverMyMeds. Will update once we receive a response.  Key: [id number]   Knox Saliva, PharmD, MPH, BCPS, CPP Clinical Pharmacist (Rheumatology and Pulmonology)

## 2022-03-17 ENCOUNTER — Other Ambulatory Visit (HOSPITAL_COMMUNITY): Payer: Self-pay

## 2022-03-17 NOTE — Telephone Encounter (Signed)
Received notification from [place] regarding a prior authorization for [place]. Authorization has been APPROVED from 03/16/22 to 09/16/22. Approval letter sent to scan center.  Unable to run test claim because patient must fill through [facility]: [id number]  Authorization # [id number]  Will need to enroll patient into Dupixent copay card  Knox Saliva, PharmD, MPH, BCPS, CPP Clinical Pharmacist (Rheumatology and Pulmonology)

## 2022-03-19 NOTE — Telephone Encounter (Signed)
Follow up apt made

## 2022-03-22 ENCOUNTER — Ambulatory Visit: Payer: BC Managed Care – PPO | Admitting: Physical Therapy

## 2022-03-29 ENCOUNTER — Ambulatory Visit: Payer: BC Managed Care – PPO | Admitting: Physical Therapy

## 2022-03-30 NOTE — Telephone Encounter (Signed)
Enrolled patient into Dupixent copay card:  ID: [id number] BIN: [id number] PCN: [id number] Group: [id number]  ATC patient to schedule Dupixent new start. Unable to reach. Left VM requesting return call  Knox Saliva, PharmD, MPH, BCPS, CPP Clinical Pharmacist (Rheumatology and Pulmonology)

## 2022-04-01 NOTE — Telephone Encounter (Signed)
ATC #2 to schedule patient for Dupixent new start. Unable to reach. Left VM requesting return call  Knox Saliva, PharmD, MPH, BCPS, CPP Clinical Pharmacist (Rheumatology and Pulmonology)

## 2022-04-07 NOTE — Telephone Encounter (Signed)
ATC #3 to schedule Dupixent new start. Unable to reach. VM box is full. Letter has been mailed. IF no f/u from patient by 04/22/22, will close encounter and further outreach  Knox Saliva, PharmD, MPH, BCPS, CPP Clinical Pharmacist (Rheumatology and Pulmonology)

## 2022-04-08 ENCOUNTER — Ambulatory Visit: Payer: BC Managed Care – PPO | Admitting: Physical Therapy

## 2022-04-08 DIAGNOSIS — Z0000 Encounter for general adult medical examination without abnormal findings: Secondary | ICD-10-CM | POA: Diagnosis not present

## 2022-04-08 DIAGNOSIS — D649 Anemia, unspecified: Secondary | ICD-10-CM | POA: Diagnosis not present

## 2022-04-08 DIAGNOSIS — Z13220 Encounter for screening for lipoid disorders: Secondary | ICD-10-CM | POA: Diagnosis not present

## 2022-04-11 DIAGNOSIS — I10 Essential (primary) hypertension: Secondary | ICD-10-CM | POA: Diagnosis not present

## 2022-04-11 DIAGNOSIS — N760 Acute vaginitis: Secondary | ICD-10-CM | POA: Diagnosis not present

## 2022-04-11 DIAGNOSIS — Z6829 Body mass index (BMI) 29.0-29.9, adult: Secondary | ICD-10-CM | POA: Diagnosis not present

## 2022-04-11 DIAGNOSIS — Z202 Contact with and (suspected) exposure to infections with a predominantly sexual mode of transmission: Secondary | ICD-10-CM | POA: Diagnosis not present

## 2022-04-12 ENCOUNTER — Ambulatory Visit: Payer: BC Managed Care – PPO | Admitting: Physical Therapy

## 2022-04-19 ENCOUNTER — Ambulatory Visit: Payer: BC Managed Care – PPO | Admitting: Physical Therapy

## 2022-04-26 ENCOUNTER — Ambulatory Visit: Payer: BC Managed Care – PPO | Admitting: Physical Therapy

## 2022-05-06 DIAGNOSIS — R946 Abnormal results of thyroid function studies: Secondary | ICD-10-CM | POA: Diagnosis not present

## 2022-05-06 DIAGNOSIS — R7989 Other specified abnormal findings of blood chemistry: Secondary | ICD-10-CM | POA: Diagnosis not present

## 2022-08-05 ENCOUNTER — Telehealth: Payer: Self-pay | Admitting: Pharmacist

## 2022-08-05 NOTE — Telephone Encounter (Signed)
Please see last signed encounter. PT is calling because she got a letter saying she was approved for Dupiant. Please call to arrange. Thanks.  Her # is [phone]

## 2022-08-05 NOTE — Telephone Encounter (Addendum)
Patient will need to be seen in clinic again by APP or MD regarding this to assess asthma. It's been 5 months since I last attempted communication with patient. Pharmacy team has to reprocess benefits investigation for Dupixent now.  Chesley Mires, PharmD, MPH, BCPS, CPP Clinical Pharmacist (Rheumatology and Pulmonology)

## 2022-09-01 ENCOUNTER — Telehealth: Payer: Self-pay | Admitting: Pharmacist

## 2022-09-01 ENCOUNTER — Ambulatory Visit (INDEPENDENT_AMBULATORY_CARE_PROVIDER_SITE_OTHER): Payer: BC Managed Care – PPO | Admitting: Pulmonary Disease

## 2022-09-01 ENCOUNTER — Encounter: Payer: Self-pay | Admitting: Pulmonary Disease

## 2022-09-01 VITALS — BP 130/100 | HR 92 | Ht 60.0 in | Wt 161.8 lb

## 2022-09-01 DIAGNOSIS — J4552 Severe persistent asthma with status asthmaticus: Secondary | ICD-10-CM

## 2022-09-01 DIAGNOSIS — Z9281 Personal history of extracorporeal membrane oxygenation (ECMO): Secondary | ICD-10-CM
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
## 2022-09-01 NOTE — Patient Instructions (Signed)
Thank you for visiting Dr. Tonia Brooms at [facility]. Today we recommend the following:  Return in about 8 weeks (around 10/27/2022) for with APP, after starting dupixent .    Please do your part to reduce the spread of COVID-19.

## 2022-09-01 NOTE — Telephone Encounter (Signed)
Please restart Dupixent BIV per Dr. Vicente Males, PharmD, MPH, BCPS, CPP Clinical Pharmacist (Rheumatology and Pulmonology)

## 2022-09-07 ENCOUNTER — Other Ambulatory Visit (HOSPITAL_COMMUNITY): Payer: Self-pay

## 2022-09-07 NOTE — Telephone Encounter (Addendum)
ATC patient to schedule Dupixent new start. Unable to reach. Left VM requesting return call  Devki Gajera, PharmD, MPH, BCPS, CPP Clinical Pharmacist (Rheumatology and Pulmonology) 

## 2022-09-07 NOTE — Telephone Encounter (Signed)
Per previous BIV encounter pt's current authorization will be active until 09/16/22, however it is unclear if loading dose is still covered due to inability to run test claim. Will submit a new request to ensure that loading dose is covered.  Submitted a Prior Authorization request to [employer] for DUPIXENT via CoverMyMeds. Will update once we receive a response.  Key: [id number]

## 2022-09-07 NOTE — Telephone Encounter (Signed)
Received notification from  [facility]  regarding a prior authorization for DUPIXENT. Authorization has been APPROVED from 09/07/2022 to 03/07/2023. Approval letter sent to scan center.  Patient must fill through [facility]: [phone]  Authorization # [id number]  Pt previously enrolled into copay card program:  RxBIN: [id number] RxPCN: [id number] RxGRP: [id number] ID: [id number]

## 2022-09-12 ENCOUNTER — Ambulatory Visit: Payer: BC Managed Care – PPO | Admitting: Pharmacist

## 2022-09-12 ENCOUNTER — Other Ambulatory Visit (HOSPITAL_COMMUNITY): Payer: Self-pay

## 2022-09-12 DIAGNOSIS — Z87892 Personal history of anaphylaxis: Secondary | ICD-10-CM

## 2022-09-12 DIAGNOSIS — Z7189 Other specified counseling: Secondary | ICD-10-CM

## 2022-09-12 DIAGNOSIS — J4552 Severe persistent asthma with status asthmaticus: Secondary | ICD-10-CM

## 2022-09-12 MED ORDER — MONTELUKAST SODIUM 10 MG PO TABS
10.0000 mg | ORAL_TABLET | Freq: Every day | ORAL | 0 refills | Status: DC
Start: 2022-09-12 — End: 2022-12-20

## 2022-09-12 MED ORDER — MOMETASONE FURO-FORMOTEROL FUM 200-5 MCG/ACT IN AERO: 2.0000 | INHALATION_SPRAY | Freq: Two times a day (BID) | RESPIRATORY_TRACT | 0 refills | Status: DC

## 2022-09-12 MED ORDER — SPIRIVA RESPIMAT 2.5 MCG/ACT IN AERS: 1.0000 | INHALATION_SPRAY | Freq: Every day | RESPIRATORY_TRACT | 0 refills | Status: DC

## 2022-09-12 MED ORDER — DUPIXENT 300 MG/2ML ~~LOC~~ SOAJ: 300.0000 mg | SUBCUTANEOUS | 1 refills | Status: DC

## 2022-09-12 MED ORDER — EPINEPHRINE 0.3 MG/0.3ML IJ SOAJ: 0.3000 mg | INTRAMUSCULAR | 5 refills | Status: DC | PRN

## 2022-09-12 NOTE — Patient Instructions (Addendum)
Your next DUPIXENT dose is due on 09/26/22, 10/10/2022, and every 14 days thereafter  CONTINUE  Dulera 200-32mcg (2 puffs twice daily), Spiriva 1 puff once daily, montelukast 10mg  nightly . Refill sent to [facility] Today  Your DUPIXENT prescription will be shipped from [facility]. Their phone number is [phone] Please call to schedule shipment and confirm address. They will mail your medication to your home.  Your copay should be affordable. If you call the pharmacy and it is not affordable, please double-check that they are billing through your copay card as secondary coverage. That copay card information is: RxBIN: [id number] RxPCN: [id number] RxGRP: [id number] ID: [id number]  You will need to be seen by your provider in 3 to 4 months to assess how DUPIXENT is working for you. Please ensure you have a follow-up appointment scheduled in December 2024 or January 2025. Call our clinic if you need to make this appointment.  How to manage an injection site reaction: Remember the 5 C's: COUNTER - leave on the counter at least 30 minutes but up to overnight to bring medication to room temperature. This may help prevent stinging COLD - place something cold (like an ice gel pack or cold water bottle) on the injection site just before cleansing with alcohol. This may help reduce pain CLARITIN - use Claritin (generic name is loratadine) for the first two weeks of treatment or the day of, the day before, and the day after injecting. This will help to minimize injection site reactions CORTISONE CREAM - apply if injection site is irritated and itching CALL ME - if injection site reaction is bigger than the size of your fist, looks infected, blisters, or if you develop hives

## 2022-09-12 NOTE — Progress Notes (Signed)
HPI Patient presents today to [facility] to see pharmacy team for Dupixent new start.  Past medical history includes severe persistent asthma  She last saw Dr. Tonia Brooms on 09/01/2022. Was hospitalized in February 2024 -> intubated and placed on ECMO. Had severe sepsis and acute metabolic encephalopathy during hospitalization. She is at very high risk for re-hospitalization due to severe asthma.  Respiratory Medications Current regimen: Dulera 200-17mcg (2 puffs twice daily), Spiriva 1 puff once daily, montelukast 10mg  nightly Patient reports no known adherence challenges. Difficult to asertain adherence - patient's fill history, rx prescribing history, and successful test claims today do not support adherence, however patient reports adherence.  OBJECTIVE Allergies  Allergen Reactions   Beta Adrenergic Blockers Shortness Of Breath    Severe wheezing   Shellfish Allergy Swelling    Throat, facial swelling   Tomato Itching and Other (See Comments)    Eczema flares    Outpatient Encounter Medications as of 09/12/2022  Medication Sig   acetaminophen (TYLENOL) 325 MG tablet Take 1-2 tablets (325-650 mg total) by mouth every 4 (four) hours as needed for mild pain. (Patient not taking: Reported on 09/01/2022)   albuterol (VENTOLIN HFA) 108 (90 Base) MCG/ACT inhaler Inhale 2 puffs into the lungs every 6 (six) hours as needed for wheezing or shortness of breath.   diltiazem (CARDIZEM) 60 MG tablet Take 1 tablet (60 mg total) by mouth every 6 (six) hours.   ferrous sulfate 325 (65 FE) MG tablet Take 1 tablet (325 mg total) by mouth 3 (three) times a week.   fexofenadine (ALLEGRA) 60 MG tablet Take 1 tablet (60 mg total) by mouth 2 (two) times daily.   loratadine (CLARITIN) 10 MG tablet Take 1 tablet (10 mg total) by mouth daily.   mometasone-formoterol (DULERA) 200-5 MCG/ACT AERO Inhale 2 puffs into the lungs 2 (two) times daily.   montelukast (SINGULAIR) 10 MG tablet Take 1 tablet (10 mg  total) by mouth at bedtime.   Multiple Vitamin (MULTIVITAMIN WITH MINERALS) TABS tablet Take 1 tablet by mouth daily.   Tiotropium Bromide Monohydrate (SPIRIVA RESPIMAT) 2.5 MCG/ACT AERS Inhale 1 puff into the lungs daily at 2 PM.   triamcinolone ointment (KENALOG) 0.1 % Apply 1 Application topically daily as needed (eczema flares).   No facility-administered encounter medications on file as of 09/12/2022.      There is no immunization history on file for this patient.   PFTs     No data to display           Eosinophils Most recent blood eosinophil count was 700 cells/microL taken on 02/26/2022.   IgE: 1063 on 02/08/2022  Assessment   Biologics training for dupilumab (Dupixent)  Goals of therapy: Mechanism: human monoclonal IgG4 antibody that inhibits interleukin-4 and interleukin-13 cytokine-induced responses, including release of proinflammatory cytokines, chemokines, and IgE Reviewed that Dupixent is add-on medication and patient must continue maintenance inhaler regimen. Reviewed importance of adherence to inhalers to ensure success of and ongoing access via insurance to Dupixent Response to therapy: may take 4 months to determine efficacy. Discussed that patients generally feel improvement sooner than 4 months.  Side effects: injection site reaction (6-18%), antibody development (5-16%), ophthalmic conjunctivitis (2-16%), transient blood eosinophilia (1-2%)  Dose: 600mg  at Week 0 (administered today in clinic) followed by 300mg  every 14 days thereafter  Administration/Storage:  Reviewed administration sites of thigh or abdomen (at least 2-3 inches away from abdomen). Reviewed the upper arm is only appropriate if caregiver is administering injection  Do not shake pen/syringe as this could lead to product foaming or precipitation. Do not use if solution is discolored or contains particulate matter or if window on prefilled pen is yellow (indicates pen has been used).  Reviewed  storage of medication in refrigerator. Reviewed that Dupixent can be stored at room temperature in unopened carton for up to 14 days.  Access: Approval of Dupixent through: insurance  Patient self-administered Dupixent 300mg /69ml x 2 (total dose 600mg ) in right lower abdomen and left lower abdomen using sample Dupixent 300mg /24mL autoinjector pen NDC: [phone] Lot: [id number] Expiration: 05/02/2024  Patient monitored for 30 minutes for adverse reaction.  Patient tolerated well. Injection site checked and no redness or swelling noted. Patient denies itchiness and irritation.  Medication Reconciliation  A drug regimen assessment was performed, including review of allergies, interactions, disease-state management, dosing and immunization history. Medications were reviewed with the patient, including name, instructions, indication, goals of therapy, potential side effects, importance of adherence, and safe use.  Drug interaction(s): none noted  PLAN Continue Dupixent 300mg  every 14 days.  Next dose is due 09/26/2022 and every 14 days thereafter. Rx sent to: [facility]: [phone].  Patient provided with pharmacy phone number and advised to call early next week to schedule shipment to home. Continue maintenance asthma regimen of: Dulera 200-72mcg (2 puffs twice daily), Spiriva 1 puff once daily, montelukast 10mg  nightly Emphasized importance of adherence to inhalers for guideline-directed treatment as well as Dupixent efficacy (and re-approval through insurance)  All questions encouraged and answered.  Instructed patient to reach out with any further questions or concerns.  Thank you for allowing pharmacy to participate in this patient's care.  This appointment required 30 minutes of patient care (this includes precharting, chart review, review of results, face-to-face care, etc.).

## 2022-10-18 DIAGNOSIS — R000 Tachycardia, unspecified: Secondary | ICD-10-CM | POA: Diagnosis not present

## 2022-10-18 DIAGNOSIS — R7303 Prediabetes: Secondary | ICD-10-CM | POA: Diagnosis not present

## 2022-10-18 DIAGNOSIS — D649 Anemia, unspecified: Secondary | ICD-10-CM | POA: Diagnosis not present

## 2022-10-18 DIAGNOSIS — E282 Polycystic ovarian syndrome: Secondary | ICD-10-CM | POA: Diagnosis not present

## 2022-10-18 DIAGNOSIS — Z23 Encounter for immunization: Secondary | ICD-10-CM | POA: Diagnosis not present

## 2022-10-18 DIAGNOSIS — J4550 Severe persistent asthma, uncomplicated: Secondary | ICD-10-CM | POA: Diagnosis not present

## 2022-10-27 ENCOUNTER — Encounter: Payer: Self-pay | Admitting: Nurse Practitioner

## 2022-10-27 ENCOUNTER — Ambulatory Visit: Payer: BC Managed Care – PPO | Admitting: Nurse Practitioner

## 2022-10-27 VITALS — BP 138/106 | HR 77 | Temp 98.7°F | Ht 60.0 in | Wt 164.0 lb

## 2022-10-27 DIAGNOSIS — Z9109 Other allergy status, other than to drugs and biological substances: Secondary | ICD-10-CM

## 2022-10-27 DIAGNOSIS — I10 Essential (primary) hypertension: Secondary | ICD-10-CM

## 2022-10-27 DIAGNOSIS — J4550 Severe persistent asthma, uncomplicated: Secondary | ICD-10-CM | POA: Diagnosis not present

## 2022-10-27 NOTE — Patient Instructions (Addendum)
Continue Albuterol inhaler 2 puffs every 6 hours as needed for shortness of breath or wheezing. Notify if symptoms persist despite rescue inhaler/neb use.  Continue dupixent injections every 14 days  Continue Dulera 2 puffs Twice daily. Brush tongue and rinse mouth afterwards Continue singulair 1 tab At bedtime  Continue spiriva 2 puffs daily Continue claritin 1 tab daily  Asthma action plan: START nebs up to four times a day for worsening shortness of breath, wheezing and cough. If you symptoms do not improve in 24-48 hours, contact us for steroid course. If your symptoms rapidly worsen, you have trouble talking or extreme difficulty breathing, or you're not getting relief from your nebulizer/rescue, go to the emergency department.  Follow up in 10-12 weeks with Dr. Tonia Brooms or Rhunette Croft, NP. If symptoms worsen, please contact office for sooner follow up or seek emergency care.

## 2022-10-27 NOTE — Assessment & Plan Note (Signed)
Significant RAST panel and elevated IgE. See above. Continue trigger prevention regimen.

## 2022-10-27 NOTE — Assessment & Plan Note (Signed)
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 She was recently started on biologic therapy with dupixent. Tolerating well. No recent exacerbations or hospitalizations. She will continue aggressive maintenance regimen. Action plan in place. Trigger prevention. Referral to allergist re-ordered today.   Patient Instructions  Continue Albuterol inhaler 2 puffs every 6 hours as needed for shortness of breath or wheezing. Notify if symptoms persist despite rescue inhaler/neb use.  Continue dupixent injections every 14 days  Continue Dulera 2 puffs Twice daily. Brush tongue and rinse mouth afterwards Continue singulair 1 tab At bedtime  Continue spiriva 2 puffs daily Continue claritin 1 tab daily  Asthma action plan: START nebs up to four times a day for worsening shortness of breath, wheezing and cough. If you symptoms do not improve in 24-48 hours, contact us for steroid course. If your symptoms rapidly worsen, you have trouble talking or extreme difficulty breathing, or you're not getting relief from your nebulizer/rescue, go to the emergency department.  Follow up in 10-12 weeks with Dr. Tonia Brooms or Rhunette Croft, NP. If symptoms worsen, please contact office for sooner follow up or seek emergency care.

## 2022-10-27 NOTE — Progress Notes (Signed)
@Patient  ID: Kaitlyn Mcneil, female    DOB: 1995-07-24, 27 y.o.   MRN: [id number]  Chief Complaint  Patient presents with   Follow-up    Doing well since starting Dupixent.  Blood pressure has been elevated.    Referring provider: Carin Hock, PA  HPI: 27 year old female, never smoker followed for severe asthma.  She has a history of severe asthma exacerbation secondary to rhinovirus requiring intubation and mechanical life support in early 2024.  She had further decompensation, requiring multiple vasopressors due to ongoing hemodynamic instability and ultimately cannulated for VV ECMO from 02/08/2022 through 02/10/2022.  She was successfully extubated on 02/17/2022.  She is a patient of Dr. Myrlene Broker and last seen in office 09/01/2022.  Past medical history significant for allergic rhinitis.  TEST/EVENTS:  02/2022 eos 300; positive RAST; IgE 1063 02/11/2022 CT chest without contrast: ET tube in place.  Bilateral lower lobe opacities, left greater than right.  Small bilateral pleural effusions.  09/01/2022: OV with Dr. Tonia Brooms.  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  Supposed to have started on Dupixent injections.  Pharmacy team had contacted her but voicemail was full.  She has been doing okay but has had increased use of albuterol.  Mild exacerbation symptoms.  Using inhalers as directed.  Did receive a letter in the mail the Dupixent was approved so she called to make an appointment.  Follow-up in 6 to 8 weeks after Dupixent has started.  10/27/2022: Today-follow-up Patient presents today for follow-up after starting on Dupixent.  Her first injection was 9/9.  Tolerated well without any significant side effects.  Last injection was this week.  She has not had any issues with her breathing since she was here last.  Has not required any steroids or antibiotics.  She has not had to use her albuterol quite as frequently.  She did get her flu shot earlier this week  and had to use it on Monday and Tuesday but symptoms have improved since.  She denies any cough, wheezing, chest tightness, PND.  Using Dulera twice a day, Spiriva once a day.  Takes Claritin and Singulair daily.  Still has not seen allergy.  BP was slightly elevated this morning.  She has not taken her blood pressure medicine yet.  No headaches, dizziness, chest pain.  Allergies  Allergen Reactions   Beta Adrenergic Blockers Shortness Of Breath    Severe wheezing   Shellfish Allergy Swelling    Throat, facial swelling   Tomato Itching and Other (See Comments)    Eczema flares     There is no immunization history on file for this patient.  Past Medical History:  Diagnosis Date   Asthma     Tobacco History: Social History   Tobacco Use  Smoking Status Never  Smokeless Tobacco Never   Counseling given: Not Answered   Outpatient Medications Prior to Visit  Medication Sig Dispense Refill   albuterol (VENTOLIN HFA) 108 (90 Base) MCG/ACT inhaler Inhale 2 puffs into the lungs every 6 (six) hours as needed for wheezing or shortness of breath.     diltiazem (CARDIZEM CD) 180 MG 24 hr capsule Take 180 mg by mouth daily.     Dupilumab (DUPIXENT) 300 MG/2ML SOPN Inject 300 mg into the skin every 14 (fourteen) days. **loading dose received in clinic on 09/12/22** 12 mL 1   EPINEPHrine 0.3 mg/0.3 mL IJ SOAJ injection Inject 0.3 mg into the muscle as needed for anaphylaxis. Call 9-1-1 after  use. May use second pen if needed 2 each 5   ferrous sulfate 325 (65 FE) MG tablet Take 1 tablet (325 mg total) by mouth 3 (three) times a week. 12 tablet 0   loratadine (CLARITIN) 10 MG tablet Take 1 tablet (10 mg total) by mouth daily. 30 tablet 0   mometasone-formoterol (DULERA) 200-5 MCG/ACT AERO Inhale 2 puffs into the lungs 2 (two) times daily. 13 g 0   montelukast (SINGULAIR) 10 MG tablet Take 1 tablet (10 mg total) by mouth at bedtime. 30 tablet 0   Multiple Vitamin (MULTIVITAMIN WITH MINERALS) TABS  tablet Take 1 tablet by mouth daily.     Tiotropium Bromide Monohydrate (SPIRIVA RESPIMAT) 2.5 MCG/ACT AERS Inhale 1 puff into the lungs daily at 2 PM. 4 g 0   triamcinolone ointment (KENALOG) 0.1 % Apply 1 Application topically daily as needed (eczema flares).     diltiazem (CARDIZEM) 60 MG tablet Take 1 tablet (60 mg total) by mouth every 6 (six) hours. (Patient not taking: Reported on 10/27/2022) 120 tablet 0   No facility-administered medications prior to visit.     Review of Systems:   Constitutional: No weight loss or gain, night sweats, fevers, chills, fatigue, or lassitude. HEENT: No headaches, difficulty swallowing, tooth/dental problems, or sore throat. No sneezing, itching, ear ache, nasal congestion, or post nasal drip CV:  No chest pain, orthopnea, PND, swelling in lower extremities, anasarca, dizziness, palpitations, syncope Resp: No shortness of breath with exertion or at rest. No excess mucus or change in color of mucus. No productive or non-productive. No hemoptysis. No wheezing.  No chest wall deformity GI:  No heartburn, indigestion GU: No dysuria, change in color of urine, urgency or frequency.  No flank pain, no hematuria  Skin: No rash, lesions, ulcerations MSK:  No joint pain or swelling.   Neuro: No dizziness or lightheadedness.  Psych: No depression or anxiety. Mood stable.     Physical Exam:  BP (!) 138/106 Comment: KC informed  Pulse 77   Temp 98.7 F (37.1 C) (Oral)   Ht 5' (1.524 m)   Wt 164 lb (74.4 kg)   SpO2 98%   BMI 32.03 kg/m   GEN: Pleasant, interactive, well-appearing; in no acute distress HEENT:  Normocephalic and atraumatic. PERRLA. Sclera white. Nasal turbinates pink, moist and patent bilaterally. No rhinorrhea present. Oropharynx pink and moist, without exudate or edema. No lesions, ulcerations, or postnasal drip.  NECK:  Supple w/ fair ROM. No JVD present. Normal carotid impulses w/o bruits. Thyroid symmetrical with no goiter or nodules  palpated. No lymphadenopathy.   CV: RRR, no m/r/g, no peripheral edema. Pulses intact, +2 bilaterally. No cyanosis, pallor or clubbing. PULMONARY:  Unlabored, regular breathing. Clear bilaterally A&P w/o wheezes/rales/rhonchi. No accessory muscle use.  GI: BS present and normoactive. Soft, non-tender to palpation. No organomegaly or masses detected.  MSK: No erythema, warmth or tenderness. Cap refil <2 sec all extrem. No deformities or joint swelling noted.  Neuro: A/Ox3. No focal deficits noted.   Skin: Warm, no lesions or rashe Psych: Normal affect and behavior. Judgement and thought content appropriate.     Lab Results:  CBC    Component Value Date/Time   WBC 11.9 (H) 02/28/2022 0645   RBC 3.56 (L) 02/28/2022 0645   HGB 9.9 (L) 02/28/2022 0645   HCT 29.9 (L) 02/28/2022 0645   PLT 333 02/28/2022 0645   MCV 84.0 02/28/2022 0645   MCH 27.8 02/28/2022 0645   MCHC 33.1 02/28/2022 0645  RDW 17.3 (H) 02/28/2022 0645   LYMPHSABS 1.8 02/26/2022 0549   MONOABS 0.9 02/26/2022 0549   EOSABS 0.7 (H) 02/26/2022 0549   BASOSABS 0.0 02/26/2022 0549    BMET    Component Value Date/Time   NA 135 02/28/2022 0645   K 3.8 02/28/2022 0645   CL 104 02/28/2022 0645   CO2 28 02/28/2022 0645   GLUCOSE 102 (H) 02/28/2022 0645   BUN 13 02/28/2022 0645   CREATININE 0.53 02/28/2022 0645   CALCIUM 9.2 02/28/2022 0645   GFRNONAA >60 02/28/2022 0645    BNP No results found for: "BNP"   Imaging:  No results found.  Administration History     None           No data to display          No results found for: "NITRICOXIDE"      Assessment & Plan:   Severe asthma without complication Eosinophilic atopic asthma with history of severe exacerbation 02/2022 requiring mechanical life support and VV ECMO. She was recently started on biologic therapy with dupixent. Tolerating well. No recent exacerbations or hospitalizations. She will continue aggressive maintenance regimen. Action  plan in place. Trigger prevention. Referral to allergist re-ordered today.   Patient Instructions  Continue Albuterol inhaler 2 puffs every 6 hours as needed for shortness of breath or wheezing. Notify if symptoms persist despite rescue inhaler/neb use.  Continue dupixent injections every 14 days  Continue Dulera 2 puffs Twice daily. Brush tongue and rinse mouth afterwards Continue singulair 1 tab At bedtime  Continue spiriva 2 puffs daily Continue claritin 1 tab daily  Asthma action plan: START nebs up to four times a day for worsening shortness of breath, wheezing and cough. If you symptoms do not improve in 24-48 hours, contact us for steroid course. If your symptoms rapidly worsen, you have trouble talking or extreme difficulty breathing, or you're not getting relief from your nebulizer/rescue, go to the emergency department.  Follow up in 10-12 weeks with Dr. Tonia Brooms or Rhunette Croft, NP. If symptoms worsen, please contact office for sooner follow up or seek emergency care.    Environmental allergies Significant RAST panel and elevated IgE. See above. Continue trigger prevention regimen.  Hypertension Advised to take BP medications when she leaves here. Monitor at home for goal <130/80. Follow up with PCP.   I spent 28 minutes of dedicated to the care of this patient on the date of this encounter to include pre-visit review of records, face-to-face time with the patient discussing conditions above, post visit ordering of testing, clinical documentation with the electronic health record, making appropriate referrals as documented, and communicating necessary findings to members of the patients care team.  Noemi Chapel, NP 10/27/2022  Pt aware and understands NP's role.

## 2022-10-27 NOTE — Assessment & Plan Note (Signed)
Advised to take BP medications when she leaves here. Monitor at home for goal <130/80. Follow up with PCP.

## 2022-11-14 DIAGNOSIS — L308 Other specified dermatitis: Secondary | ICD-10-CM | POA: Diagnosis not present

## 2022-11-14 DIAGNOSIS — L910 Hypertrophic scar: Secondary | ICD-10-CM | POA: Diagnosis not present

## 2022-12-12 DIAGNOSIS — L910 Hypertrophic scar: Secondary | ICD-10-CM | POA: Diagnosis not present

## 2022-12-20 ENCOUNTER — Encounter: Payer: Self-pay | Admitting: Allergy and Immunology

## 2022-12-20 ENCOUNTER — Other Ambulatory Visit: Payer: Self-pay

## 2022-12-20 ENCOUNTER — Other Ambulatory Visit: Payer: Self-pay | Admitting: Allergy and Immunology

## 2022-12-20 ENCOUNTER — Ambulatory Visit (INDEPENDENT_AMBULATORY_CARE_PROVIDER_SITE_OTHER): Payer: BC Managed Care – PPO | Admitting: Allergy and Immunology

## 2022-12-20 VITALS — BP 138/88 | HR 90 | Temp 98.0°F | Resp 16 | Ht 60.0 in | Wt 164.3 lb

## 2022-12-20 DIAGNOSIS — J301 Allergic rhinitis due to pollen: Secondary | ICD-10-CM | POA: Diagnosis not present

## 2022-12-20 DIAGNOSIS — J3089 Other allergic rhinitis: Secondary | ICD-10-CM

## 2022-12-20 DIAGNOSIS — D7219 Other eosinophilia: Secondary | ICD-10-CM

## 2022-12-20 DIAGNOSIS — L2089 Other atopic dermatitis: Secondary | ICD-10-CM | POA: Diagnosis not present

## 2022-12-20 DIAGNOSIS — T65221D Toxic effect of tobacco cigarettes, accidental (unintentional), subsequent encounter: Secondary | ICD-10-CM

## 2022-12-20 DIAGNOSIS — Z91013 Allergy to seafood: Secondary | ICD-10-CM

## 2022-12-20 DIAGNOSIS — J4550 Severe persistent asthma, uncomplicated: Secondary | ICD-10-CM | POA: Diagnosis not present

## 2022-12-20 MED ORDER — TRIAMCINOLONE ACETONIDE 0.1 % EX OINT: 1.0000 | TOPICAL_OINTMENT | Freq: Every day | CUTANEOUS | 1 refills | Status: DC | PRN

## 2022-12-20 MED ORDER — TRIAMCINOLONE ACETONIDE 55 MCG/ACT NA AERO: 2.0000 | INHALATION_SPRAY | Freq: Every morning | NASAL | 1 refills | Status: DC

## 2022-12-20 MED ORDER — NEBULIZER MASK ADULT MISC: 1.0000 | 1 refills | Status: DC

## 2022-12-20 MED ORDER — SPIRIVA RESPIMAT 2.5 MCG/ACT IN AERS: 2.0000 | INHALATION_SPRAY | Freq: Every morning | RESPIRATORY_TRACT | 1 refills | Status: DC

## 2022-12-20 MED ORDER — BUDESONIDE 0.5 MG/2ML IN SUSP: 0.5000 mg | Freq: Four times a day (QID) | RESPIRATORY_TRACT | 1 refills | Status: DC | PRN

## 2022-12-20 MED ORDER — PREDNISONE 10 MG PO TABS: 40.0000 mg | ORAL_TABLET | ORAL | 0 refills | Status: DC

## 2022-12-20 MED ORDER — PIMECROLIMUS 1 % EX CREA: TOPICAL_CREAM | Freq: Every day | CUTANEOUS | 1 refills | Status: DC

## 2022-12-20 MED ORDER — IPRATROPIUM-ALBUTEROL 0.5-2.5 (3) MG/3ML IN SOLN: 3.0000 mL | Freq: Four times a day (QID) | RESPIRATORY_TRACT | 1 refills | Status: DC | PRN

## 2022-12-20 MED ORDER — CETIRIZINE HCL 10 MG PO TABS: 10.0000 mg | ORAL_TABLET | Freq: Every day | ORAL | 1 refills | Status: DC | PRN

## 2022-12-20 MED ORDER — MOMETASONE FURO-FORMOTEROL FUM 200-5 MCG/ACT IN AERO: 2.0000 | INHALATION_SPRAY | Freq: Two times a day (BID) | RESPIRATORY_TRACT | 1 refills | Status: DC

## 2022-12-20 MED ORDER — MONTELUKAST SODIUM 10 MG PO TABS: 10.0000 mg | ORAL_TABLET | Freq: Every evening | ORAL | 1 refills | Status: DC

## 2022-12-20 MED ORDER — AIRSUPRA 90-80 MCG/ACT IN AERO: 2.0000 | INHALATION_SPRAY | RESPIRATORY_TRACT | 1 refills | Status: DC | PRN

## 2022-12-20 NOTE — Patient Instructions (Addendum)
  1. Treat and prevent inflammation of airway:   A. Dupilumab injections every 2 weeks  B. Dulera 200 - 2 inhalations 2 times per day (empty lungs)  C. Spiriva 2.5 Respimat - 2 inhalations 1 time per day (empty lungs)  D. Montelukast 10 mg - 1 tablet 1 time per day  E. OTC Nasacort - 2 sprays each nostril 1 time per day  2. If needed:   A. Airsupra - 2 inhalations every 6 hours (replaces albuterol)  B. Cetirizine 10 mg - 1 tablet 1 time per day  3. "Action Plan" for flare up:   A. Duoneb + Budesonide 0.5 mg - nebulize TOGETHER 4 times per day  B. Prednisone 10 mg - take 4 tablets C. Contact clinic  4. Perform allergen avoidance measures - dust mite, animal, pollen  5. Eliminate second hand tobacco smoke exposure (brother)  6. Treat and prevent skin inflammation:   A. Triamcinolone 0.1% ointment - 1-7 times per day to body  B. Elidel - 1-7 times per day to face  7. Blood - ANCA w/R  8. Return to clinic in 4 weeks or earlier if problem
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
## 2022-12-21 ENCOUNTER — Encounter: Payer: Self-pay | Admitting: Allergy and Immunology

## 2022-12-21 NOTE — Addendum Note (Signed)
Addended by: Berna Bue on: 12/21/2022 09:23 AM   Modules accepted: Orders

## 2022-12-26 DIAGNOSIS — J4550 Severe persistent asthma, uncomplicated: Secondary | ICD-10-CM | POA: Diagnosis not present

## 2022-12-27 LAB — ANCA TITERS
Atypical pANCA: <1:20 {titer}
C-ANCA: <1:20 {titer}
P-ANCA: <1:20 {titer}

## 2023-01-02 ENCOUNTER — Telehealth: Payer: Self-pay

## 2023-01-02 NOTE — Patient Instructions (Addendum)
  1. Treat and prevent inflammation of airway:   A. Dupilumab  injections every 2 weeks  B. Dulera  200 - 2 inhalations 2 times per day (empty lungs)  C. Spiriva  2.5 Respimat - 2 inhalations 1 time per day (empty lungs)  D. Montelukast  10 mg - 1 tablet 1 time per day  E. OTC Nasacort  - 2 sprays each nostril 1 time per day  2. If needed:   A. Airsupra  - 2 inhalations every 6 hours (replaces albuterol )  B. Cetirizine  10 mg - 1 tablet 1 time per day  C. Epi-Pen  3. Action Plan for flare up:   A. Duoneb + Budesonide  0.5 mg - nebulize TOGETHER 4 times per day  B. Prednisone  10 mg - take 4 tablets C. Contact clinic  4. Perform allergen avoidance measures - dust mite, animal, pollen, shellfish  5. Eliminate second hand tobacco smoke exposure (brother)  6. Treat and prevent skin inflammation:   A. Triamcinolone  0.1% ointment - 1-7 times per day to body  B. Elidel  - 1-7 times per day to face  7. To cover for community acquired pneumonia: start Augmentin  875 mg taking 1 tablet  2 times a day x 7 days and azithromycin  taking 2 tablets on the first day,then one tablet once a day for the next four days . Let us  know if you are not getting better. If symptoms worsen please go to the ER.  Also, start prednisone  10 mg taking 1 tablet twice a day for 4 days, then on the fifth day take 1 tablet and stop  8. Keep already scheduled follow up appointment on 01/24/23 @ 1:45 PM with Dr. Kozlow or earlier if problem  Your blood pressure is elevated today while in the office.  Please schedule an appointment with your primary care physician to discuss.

## 2023-01-02 NOTE — Telephone Encounter (Signed)
Called and spoke with pt about lab results - pt had no concerns or question.   Pt states that she has congestion in her chest x 1 week state she has been coughing up yellowish green phylum. States that she does not have a regular ongoing cough or any fevers.   Wants to know if she could get some prednisone or what ever is recommended.   Patient was advised an office visit is needed for further evaluation.   Pt has been schedule for an appoint tomorrow @ 4pm

## 2023-01-03 ENCOUNTER — Ambulatory Visit: Payer: BC Managed Care – PPO | Admitting: Family

## 2023-01-03 ENCOUNTER — Encounter: Payer: Self-pay | Admitting: Family

## 2023-01-03 ENCOUNTER — Other Ambulatory Visit: Payer: Self-pay

## 2023-01-03 VITALS — BP 140/90 | HR 93 | Temp 97.9°F | Resp 16 | Wt 168.3 lb

## 2023-01-03 DIAGNOSIS — T65221D Toxic effect of tobacco cigarettes, accidental (unintentional), subsequent encounter: Secondary | ICD-10-CM

## 2023-01-03 DIAGNOSIS — Z91013 Allergy to seafood: Secondary | ICD-10-CM

## 2023-01-03 DIAGNOSIS — L2089 Other atopic dermatitis: Secondary | ICD-10-CM

## 2023-01-03 DIAGNOSIS — J3089 Other allergic rhinitis: Secondary | ICD-10-CM

## 2023-01-03 DIAGNOSIS — R058 Other specified cough: Secondary | ICD-10-CM | POA: Diagnosis not present

## 2023-01-03 DIAGNOSIS — J301 Allergic rhinitis due to pollen: Secondary | ICD-10-CM | POA: Diagnosis not present

## 2023-01-03 DIAGNOSIS — J4550 Severe persistent asthma, uncomplicated: Secondary | ICD-10-CM

## 2023-01-03 DIAGNOSIS — D7219 Other eosinophilia: Secondary | ICD-10-CM

## 2023-01-03 MED ORDER — AZITHROMYCIN 250 MG PO TABS: ORAL_TABLET | ORAL | 0 refills | Status: DC

## 2023-01-03 MED ORDER — LEVALBUTEROL TARTRATE 45 MCG/ACT IN AERO
4.0000 | INHALATION_SPRAY | Freq: Once | RESPIRATORY_TRACT | Status: AC
  Administered 2023-01-03: 4 via RESPIRATORY_TRACT

## 2023-01-03 MED ORDER — PREDNISONE 10 MG PO TABS: ORAL_TABLET | ORAL | 0 refills | Status: DC

## 2023-01-03 MED ORDER — AMOXICILLIN-POT CLAVULANATE 875-125 MG PO TABS
1.0000 | ORAL_TABLET | Freq: Two times a day (BID) | ORAL | 0 refills | Status: DC
Start: 2023-01-03 — End: 2023-03-02

## 2023-01-03 NOTE — Progress Notes (Signed)
 [address] [place] [address] Dept: [phone]  FOLLOW UP NOTE  [name], female    DOB: 25-Oct-1995  Age: 27 y.o. MRN: [id number] Date of Office Visit: 01/03/2023  Assessment  Chief Complaint: Nasal Congestion (Yellowish green mucous. Both in nose and chest. Still has mucous despite just finishing a round of prednisone . Symptoms started a week ago. )  HPI Kaitlyn Mcneil is a 27 year old female who presents today for an acute visit of congestion and phlegm in her chest nasal congestion.  She was last seen on December 20, 2022 by Dr. Kozlow for well-controlled severe persistent asthma, seasonal and perennial allergic rhinitis, atopic dermatitis, shellfish allergy, eosinophilia, and toxic effect of secondhand tobacco smoke.  She denies any new diagnosis or surgery since her last office visit.  She has a history of hospitalization in 2023 for her asthma and then she required hospitalization accompanied with respiratory failure requiring prolonged intubation of 6 weeks, trach, ECMO following a rhinovirus March 2024.  She reports approximately a week ago she developed congestion and phlegm in her chest and nasal congestion.  As soon as her symptoms started she started taking the prednisone  40 mg once a day for 3 days that Dr. Kozlow prescribed and reports that she still had phlegm.  She feels like the phlegm is coming from her chest.  She reports a productive cough with yellow sputum for 2 days, but prior to that it was clear.  She also has slight wheezing that got better after taking the prednisone .  She has had 1 episode of wheezing since finishing the prednisone  and her Airsupra  helped.  She has not had too much tightness in her chest.  She has had nocturnal awakenings due to wheezing.  She denies fever, chills, body aches, and shortness of breath.  Since her last office visit she has not made any trips to the emergency room or urgent care due to breathing problems.   She reports that when her symptoms started she started using her Airsupra  more frequently and has now just used Airsupra  once since stopping the prednisone .  She did not start the DuoNeb plus budesonide  together 4 times a day as per her action plan.  She still is exposed to secondhand smoke from her brother.  Seasonal and perennial allergic rhinitis: She reports nasal congestion and reports sometimes she has postnasal drip.  She denies rhinorrhea and sinus pressure.  She just recently started using Nasacort  nasal spray daily and takes cetirizine  10 mg daily.  She has not been treated for any sinus infections since we last saw her.  Shellfish allergy: She continues to avoid shellfish without any accidental ingestion or use of her epinephrine  autoinjector device.  Atopic dermatitis: She reports her eczema has been better.  She still has areas on her face.  She has triamcinolone  0.1% ointment to use as needed and Elidel  to use as needed.  She also receives dupilumab  injections every 2 weeks.  Her last injection was yesterday.  She denies any problems or reactions with her dupilumab  injection.   Drug Allergies:  Allergies  Allergen Reactions   Beta Adrenergic Blockers Shortness Of Breath    Severe wheezing   Shellfish Allergy Anaphylaxis and Swelling   Tomato Itching and Other (See Comments)    Eczema flares    Review of Systems: Negative except as per HPI   Physical Exam: BP (!) 140/90   Pulse 93   Temp 97.9 F (36.6 C) (Temporal)  Resp 16   Wt 168 lb 4.8 oz (76.3 kg)   SpO2 96%   BMI 32.87 kg/m    Physical Exam Constitutional:      Appearance: Normal appearance.  HENT:     Head: Normocephalic and atraumatic.     Comments: Pharynx normal, eyes normal, ears normal, nose: Bilateral lower turbinates moderately edematous and slightly erythematous with no drainage noted.  Left turbinate greater than right turbinate.    Right Ear: Tympanic membrane, ear canal and external ear normal.      Left Ear: Tympanic membrane, ear canal and external ear normal.     Mouth/Throat:     Mouth: Mucous membranes are moist.     Pharynx: Oropharynx is clear.  Eyes:     Conjunctiva/sclera: Conjunctivae normal.  Cardiovascular:     Rate and Rhythm: Regular rhythm.     Heart sounds: Normal heart sounds.  Pulmonary:     Effort: Pulmonary effort is normal.     Breath sounds: Normal breath sounds.     Comments: Lungs clear to auscultation Musculoskeletal:     Cervical back: Neck supple.  Skin:    General: Skin is warm.  Neurological:     Mental Status: She is alert and oriented to person, place, and time.  Psychiatric:        Mood and Affect: Mood normal.        Behavior: Behavior normal.        Thought Content: Thought content normal.        Judgment: Judgment normal.     Diagnostics: FVC 2.15 L (76%), FEV1 1.67 L (68%) FEV1/FVC 0.78.  Spirometry indicates possible restrictive defect.  4 puffs of Xopenex  given.  Postbronchodilator response shows FVC 1.99 (70%) l, FEV1 1.67 L (68%), FEV1/FVC 0.84.  Spirometry indicates possible restrictive defect with no change in FEV1.  Assessment and Plan: 1. Not well controlled severe persistent asthma   2. Perennial allergic rhinitis   3. Seasonal allergic rhinitis due to pollen   4. Other atopic dermatitis   5. Shellfish allergy   6. Other eosinophilia   7. Toxic effect of secondhand tobacco smoke, accidental or unintentional, subsequent encounter     Meds ordered this encounter  Medications   levalbuterol  (XOPENEX  HFA) inhaler 4 puff    Patient Instructions   1. Treat and prevent inflammation of airway:   A. Dupilumab  injections every 2 weeks  B. Dulera  200 - 2 inhalations 2 times per day (empty lungs)  C. Spiriva  2.5 Respimat - 2 inhalations 1 time per day (empty lungs)  D. Montelukast  10 mg - 1 tablet 1 time per day  E. OTC Nasacort  - 2 sprays each nostril 1 time per day  2. If needed:   A. Airsupra  - 2 inhalations every  6 hours (replaces albuterol )  B. Cetirizine  10 mg - 1 tablet 1 time per day  C. Epi-Pen  3. Action Plan for flare up:   A. Duoneb + Budesonide  0.5 mg - nebulize TOGETHER 4 times per day  B. Prednisone  10 mg - take 4 tablets C. Contact clinic  4. Perform allergen avoidance measures - dust mite, animal, pollen, shellfish  5. Eliminate second hand tobacco smoke exposure (brother)  6. Treat and prevent skin inflammation:   A. Triamcinolone  0.1% ointment - 1-7 times per day to body  B. Elidel  - 1-7 times per day to face  7. To cover for community acquired pneumonia: start Augmentin  875 mg taking 1 tablet  2 times  a day x 7 days and azithromycin  taking 2 tablets on the first day,then one tablet once a day for the next four days . Let us  know if you are not getting better. If symptoms worsen please go to the ER.  Also, start prednisone  10 mg taking 1 tablet twice a day for 4 days, then on the fifth day take 1 tablet and stop  8. Keep already scheduled follow up appointment on 01/24/23 @ 1:45 PM with Dr. Kozlow or earlier if problem  Your blood pressure is elevated today while in the office.  Please schedule an appointment with your primary care physician to discuss.  Return in about 3 weeks (around 01/24/2023), or if symptoms worsen or fail to improve.    Thank you for the opportunity to care for this patient.  Please do not hesitate to contact me with questions.  Wanda Craze, FNP [facility] 

## 2023-01-09 ENCOUNTER — Ambulatory Visit: Payer: BC Managed Care – PPO | Admitting: Nurse Practitioner

## 2023-01-09 DIAGNOSIS — L910 Hypertrophic scar: Secondary | ICD-10-CM | POA: Diagnosis not present

## 2023-01-24 ENCOUNTER — Other Ambulatory Visit: Payer: Self-pay

## 2023-01-24 ENCOUNTER — Ambulatory Visit: Payer: BC Managed Care – PPO | Admitting: Allergy and Immunology

## 2023-01-24 ENCOUNTER — Encounter: Payer: Self-pay | Admitting: Allergy and Immunology

## 2023-01-24 VITALS — BP 120/78 | HR 78 | Temp 98.1°F | Resp 17 | Wt 166.9 lb

## 2023-01-24 DIAGNOSIS — J301 Allergic rhinitis due to pollen: Secondary | ICD-10-CM | POA: Diagnosis not present

## 2023-01-24 DIAGNOSIS — Z91013 Allergy to seafood: Secondary | ICD-10-CM

## 2023-01-24 DIAGNOSIS — J4550 Severe persistent asthma, uncomplicated: Secondary | ICD-10-CM | POA: Diagnosis not present

## 2023-01-24 DIAGNOSIS — L2089 Other atopic dermatitis: Secondary | ICD-10-CM

## 2023-01-24 DIAGNOSIS — J3089 Other allergic rhinitis: Secondary | ICD-10-CM | POA: Diagnosis not present

## 2023-01-24 DIAGNOSIS — J324 Chronic pansinusitis: Secondary | ICD-10-CM

## 2023-01-24 DIAGNOSIS — R430 Anosmia: Secondary | ICD-10-CM

## 2023-01-24 MED ORDER — MOMETASONE FURO-FORMOTEROL FUM 200-5 MCG/ACT IN AERO: 2.0000 | INHALATION_SPRAY | Freq: Two times a day (BID) | RESPIRATORY_TRACT | 5 refills | Status: DC

## 2023-01-24 MED ORDER — AIRSUPRA 90-80 MCG/ACT IN AERO: 2.0000 | INHALATION_SPRAY | Freq: Four times a day (QID) | RESPIRATORY_TRACT | 5 refills | Status: DC | PRN

## 2023-01-24 MED ORDER — PREDNISONE 10 MG PO TABS: 40.0000 mg | ORAL_TABLET | ORAL | 0 refills | Status: AC | PRN

## 2023-01-24 MED ORDER — TRIAMCINOLONE ACETONIDE 0.1 % EX OINT: 1.0000 | TOPICAL_OINTMENT | Freq: Every day | CUTANEOUS | 5 refills | Status: DC | PRN

## 2023-01-24 MED ORDER — SPIRIVA RESPIMAT 2.5 MCG/ACT IN AERS: 2.0000 | INHALATION_SPRAY | Freq: Every morning | RESPIRATORY_TRACT | 5 refills | Status: DC

## 2023-01-24 MED ORDER — TRIAMCINOLONE ACETONIDE 55 MCG/ACT NA AERO: 2.0000 | INHALATION_SPRAY | Freq: Every morning | NASAL | 5 refills | Status: DC

## 2023-01-24 MED ORDER — MONTELUKAST SODIUM 10 MG PO TABS: 10.0000 mg | ORAL_TABLET | Freq: Every evening | ORAL | 5 refills | Status: DC

## 2023-01-24 MED ORDER — IPRATROPIUM-ALBUTEROL 0.5-2.5 (3) MG/3ML IN SOLN: 3.0000 mL | Freq: Four times a day (QID) | RESPIRATORY_TRACT | 5 refills | Status: DC | PRN

## 2023-01-24 NOTE — Progress Notes (Unsigned)
[place] [facility] [place] [facility] [place] [facility] [place] [facility] [place]   Follow-up Note  Referring Provider: Carin Hock, [place] Primary Provider: Carin Hock, [place] Date of Office Visit: 01/24/2023  Subjective:   Kaitlyn Mcneil (DOB: 1995-05-12) is a 28 y.o. female who [facility] in re-evaluation of the following:  HPI: [name] presents to this clinic in evaluation of asthma, allergic rhinitis, atopic dermatitis, shellfish allergy.  I last saw her in this clinic 20 December 2022.  She was seen by our nurse practitioner 03 January 2023.  During her visit with nurse practitioner she appeared to have a viral respiratory tract infection induced flare of her respiratory tract inflammatory state and she required systemic steroids and a broad-spectrum antibiotic.  As a result of the therapy she received during her last visit she thinks that she is back to baseline regarding her lower respiratory tract issues and she no longer needs to use any albuterol.  She continues on a large collection of anti-inflammatory agents for her airway including anti-IL-4/13 biologic agent.  She has chronic nasal congestion and stuffiness and she sometimes has some difficulty smelling.  This occurs even though she has been consistently using nasal steroids and montelukast.  Fortunately, she is no longer exposed to tobacco smoke as her brother is now smoking outdoors.  She believes that her skin is doing quite well while intermittently using Elidel to her face and triamcinolone to her body.  She remains away from consuming shellfish.  Allergies as of 01/24/2023       Reactions   Beta Adrenergic Blockers Shortness Of Breath   Severe wheezing   Shellfish Allergy Anaphylaxis, Swelling   Tomato Itching, Other (See Comments)   Eczema flares        Medication List    Airsupra 90-80 MCG/ACT Aero Generic drug: Albuterol-Budesonide INHALE 2 PUFFS INTO LUNGS EVERY 4  HOURS AS NEEDED   albuterol 108 (90 Base) MCG/ACT inhaler Commonly known as: VENTOLIN HFA Inhale 2 puffs into the lungs every 6 (six) hours as needed for wheezing or shortness of breath.   amoxicillin-clavulanate 875-125 MG tablet Commonly known as: AUGMENTIN Take 1 tablet by mouth 2 (two) times daily.   azithromycin 250 MG tablet Commonly known as: Zithromax Take 2 tablets on the first day, then one tablet once a day for the next 4 days   budesonide 0.5 MG/2ML nebulizer solution Commonly known as: PULMICORT Take 2 mLs (0.5 mg total) by nebulization every 6 (six) hours as needed. Mix with albuterol during flare ups.   cetirizine 10 MG tablet Commonly known as: ZYRTEC Take 1 tablet (10 mg total) by mouth daily as needed (Can take an extra dose during flare ups.).   diltiazem 180 MG 24 hr capsule Commonly known as: CARDIZEM CD Take 180 mg by mouth daily.   Dupixent 300 MG/2ML Soaj Generic drug: Dupilumab Inject 300 mg into the skin every 14 (fourteen) days. **loading dose received in clinic on 09/12/22**   EPINEPHrine 0.3 mg/0.3 mL Soaj injection Commonly known as: EPI-PEN Inject 0.3 mg into the muscle as needed for anaphylaxis. Call 9-1-1 after use. May use second pen if needed   FeroSul 325 (65 Fe) MG tablet Generic drug: ferrous sulfate Take 1 tablet (325 mg total) by mouth 3 (three) times a week.   ipratropium-albuterol 0.5-2.5 (3) MG/3ML Soln Commonly known as: DUONEB Take 3 mLs by nebulization every 6 (six) hours as needed. Mix with budesonide during flare ups.  loratadine 10 MG tablet Commonly known as: CLARITIN Take 1 tablet (10 mg total) by mouth daily.   mometasone-formoterol 200-5 MCG/ACT Aero Commonly known as: DULERA Inhale 2 puffs into the lungs 2 (two) times daily.   montelukast 10 MG tablet Commonly known as: SINGULAIR Take 1 tablet (10 mg total) by mouth at bedtime.   multivitamin with minerals Tabs tablet Take 1 tablet by mouth daily.    Nebulizer Mask Adult Misc 1 Device by Does not apply route as directed.   pimecrolimus 1 % cream Commonly known as: ELIDEL Apply to skin twice daily as needed   predniSONE 10 MG tablet Commonly known as: DELTASONE Take 4 tablets (40 mg total) by mouth as needed.   predniSONE 10 MG tablet Commonly known as: DELTASONE Take one tablet twice a day for 4 days, then on the 5th day take one tablet and stop   Spiriva Respimat 2.5 MCG/ACT Aers Generic drug: Tiotropium Bromide Monohydrate Inhale 2 puffs into the lungs every morning.   triamcinolone 55 MCG/ACT Aero nasal inhaler Commonly known as: NASACORT Place 2 sprays into the nose every morning.   triamcinolone ointment 0.1 % Commonly known as: KENALOG Apply 1 Application topically daily as needed (eczema flares). Can use below the neck. Avoid armpits and groin.    Past Medical History:  Diagnosis Date   Asthma     Past Surgical History:  Procedure Laterality Date   CANNULATION FOR ECMO (EXTRACORPOREAL MEMBRANE OXYGENATION) N/A 02/07/2022   Procedure: CANNULATION FOR ECMO (EXTRACORPOREAL MEMBRANE OXYGENATION);  Surgeon: Dolores Patty, MD;  Location: [facility] OR;  Service: Cardiovascular;  Laterality: N/A;   ECMO CANNULATION N/A 02/07/2022   Procedure: ECMO CANNULATION;  Surgeon: Dolores Patty, MD;  Location: [facility] INVASIVE CV LAB;  Service: Cardiovascular;  Laterality: N/A;   HERNIA REPAIR  2004   TEE WITHOUT CARDIOVERSION N/A 02/07/2022   Procedure: TRANSESOPHAGEAL ECHOCARDIOGRAM;  Surgeon: Dolores Patty, MD;  Location: [facility] INVASIVE CV LAB;  Service: Cardiovascular;  Laterality: N/A;    Review of systems negative except as noted in HPI / PMHx or noted below:  Review of Systems  Constitutional: Negative.   HENT: Negative.    Eyes: Negative.   Respiratory: Negative.    Cardiovascular: Negative.   Gastrointestinal: Negative.   Genitourinary: Negative.   Musculoskeletal: Negative.   Skin: Negative.    Neurological: Negative.   Endo/Heme/Allergies: Negative.   Psychiatric/Behavioral: Negative.       Objective:   Vitals:   01/24/23 1431  BP: 120/78  Pulse: 78  Resp: 17  Temp: 98.1 F (36.7 C)  SpO2: 98%      Weight: 166 lb 14.4 oz (75.7 kg)   Physical Exam Constitutional:      Appearance: She is not diaphoretic.  HENT:     Head: Normocephalic.     Right Ear: Tympanic membrane, ear canal and external ear normal.     Left Ear: Tympanic membrane, ear canal and external ear normal.     Nose: Nose normal. No mucosal edema or rhinorrhea.     Mouth/Throat:     Pharynx: Uvula midline. No oropharyngeal exudate.  Eyes:     Conjunctiva/sclera: Conjunctivae normal.  Neck:     Thyroid: No thyromegaly.     Trachea: Trachea normal. No tracheal tenderness or tracheal deviation.  Cardiovascular:     Rate and Rhythm: Normal rate and regular rhythm.     Heart sounds: Normal heart sounds, S1 normal and S2 normal. No murmur heard. Pulmonary:  Effort: No respiratory distress.     Breath sounds: Normal breath sounds. No stridor. No wheezing or rales.  Lymphadenopathy:     Head:     Right side of head: No tonsillar adenopathy.     Left side of head: No tonsillar adenopathy.     Cervical: No cervical adenopathy.  Skin:    Findings: No erythema or rash.     Nails: There is no clubbing.  Neurological:     Mental Status: She is alert.     Diagnostics: Spirometry was performed and demonstrated an FEV1 of 1.81 at 74 % of predicted.  Assessment and Plan:   1. Not well controlled severe persistent asthma   2. Perennial allergic rhinitis   3. Seasonal allergic rhinitis due to pollen   4. Other atopic dermatitis   5. Shellfish allergy    1. Treat and prevent inflammation of airway:   A. Dupilumab injections every 2 weeks  B. Dulera 200 - 2 inhalations 2 times per day (empty lungs)  C. Spiriva 2.5 Respimat - 2 inhalations 1 time per day (empty lungs)  D. Montelukast 10 mg - 1  tablet 1 time per day  E. OTC Nasacort - 2 sprays each nostril 1 time per day  2. If needed:   A. Airsupra - 2 inhalations every 6 hours (replaces albuterol)  B. Cetirizine 10 mg - 1 tablet 1 time per day  C. Epi-Pen  3. "Action Plan" for flare up:   A. Duoneb + Budesonide 0.5 mg - TOGETHER 4 times per day  B. Prednisone 10 mg - take 4 tablets daily for 3 days C. Contact clinic on day one of flare up  4. Perform allergen avoidance measures - dust mite, animal, pollen, shellfish  5. Treat and prevent skin inflammation:   A. Triamcinolone 0.1% ointment - 1-7 times per day to body  B. Elidel - 1-7 times per day to face  7. Obtain sinus CT scan for chronic sinusitis  8. Return to clinic in 12 weeks or earlier if problem  9. Influenza = Tamiflu. Covid = Paxlovid  Valli appears to be stable at this point in time although certainly her prior history suggest that she has the capacity to develop very significant respiratory tract failure with viral infections and we are going to aggressively treat her preventatively for inflammation of her airway with the therapy noted above which includes anti-IL-4/13 biologic agent.  We will further investigate for chronic sinusitis by obtaining a sinus CT scan.  She will continue on a "action plan" which includes the use of systemic steroids at the onset of her flareup.  If she continues to show some instability as she moves forward then we may need to change her biologic agent to anti-TSLP.  I will see her back in this clinic in 12 weeks or earlier if there is a problem.  I will contact her with the results of her CT scan once it is available for review.  Laurette Schimke, MD Allergy / Immunology [facility]

## 2023-01-24 NOTE — Patient Instructions (Addendum)
  1. Treat and prevent inflammation of airway:   A. Dupilumab injections every 2 weeks  B. Dulera 200 - 2 inhalations 2 times per day (empty lungs)  C. Spiriva 2.5 Respimat - 2 inhalations 1 time per day (empty lungs)  D. Montelukast 10 mg - 1 tablet 1 time per day  E. OTC Nasacort - 2 sprays each nostril 1 time per day  2. If needed:   A. Airsupra - 2 inhalations every 6 hours (replaces albuterol)  B. Cetirizine 10 mg - 1 tablet 1 time per day  C. Epi-Pen  3. "Action Plan" for flare up:   A. Duoneb + Budesonide 0.5 mg - TOGETHER 4 times per day  B. Prednisone 10 mg - take 4 tablets daily for 3 days C. Contact clinic on day one of flare up  4. Perform allergen avoidance measures - dust mite, animal, pollen, shellfish  5. Treat and prevent skin inflammation:   A. Triamcinolone 0.1% ointment - 1-7 times per day to body  B. Elidel - 1-7 times per day to face  7. Obtain sinus CT scan for chronic sinusitis  8. Return to clinic in 12 weeks or earlier if problem  9. Influenza = Tamiflu. Covid = Paxlovid

## 2023-01-25 ENCOUNTER — Encounter: Payer: Self-pay | Admitting: Allergy and Immunology

## 2023-02-17 ENCOUNTER — Telehealth: Payer: Self-pay | Admitting: Pulmonary Disease

## 2023-02-17 DIAGNOSIS — J4552 Severe persistent asthma with status asthmaticus: Secondary | ICD-10-CM

## 2023-02-17 NOTE — Telephone Encounter (Signed)
Refill sent for DUPIXENT to [facility]: [phone]  Dose: 300mg  SQ every 14 days  Last OV: 10/2022 Provider: Previous patient of Dr. Tonia Brooms  Next OV: not scheduled and overdue  Routing to scheduling team for follow-up on appt scheduling  Chesley Mires, PharmD, MPH, BCPS Clinical Pharmacist (Rheumatology and Pulmonology)

## 2023-02-22 ENCOUNTER — Telehealth: Payer: Self-pay | Admitting: Pharmacist

## 2023-02-22 NOTE — Telephone Encounter (Signed)
 Submitted a Prior Authorization RENEWAL request to [employer] for DUPIXENT via CoverMyMeds. Will update once we receive a response.  Key: [id number]

## 2023-02-23 NOTE — Telephone Encounter (Signed)
 Received notification from  [facility]  regarding a prior authorization for DUPIXENT. Authorization has been APPROVED from 02/22/2023 to 02/22/2024. Approval letter sent to scan center.  Authorization # [id number] Phone # [phone]

## 2023-03-02 ENCOUNTER — Ambulatory Visit: Payer: BC Managed Care – PPO | Admitting: Nurse Practitioner

## 2023-03-02 ENCOUNTER — Ambulatory Visit (INDEPENDENT_AMBULATORY_CARE_PROVIDER_SITE_OTHER): Payer: BC Managed Care – PPO

## 2023-03-02 ENCOUNTER — Encounter: Payer: Self-pay | Admitting: Nurse Practitioner

## 2023-03-02 VITALS — BP 152/99 | HR 83 | Temp 98.5°F | Ht 60.0 in | Wt 173.0 lb

## 2023-03-02 DIAGNOSIS — J4550 Severe persistent asthma, uncomplicated: Secondary | ICD-10-CM

## 2023-03-02 DIAGNOSIS — I10 Essential (primary) hypertension: Secondary | ICD-10-CM

## 2023-03-02 DIAGNOSIS — J45909 Unspecified asthma, uncomplicated: Secondary | ICD-10-CM | POA: Diagnosis not present

## 2023-03-02 NOTE — Progress Notes (Signed)
 @Patient  ID: Kaitlyn Mcneil, female    DOB: [birth year], 28 y.o.   MRN: [id number]  Chief Complaint  Patient presents with   Follow-up    Breathing is overall doing well. Had flare in Jan 2025- tx with zpack and pred taper per allergy and asthma.     Referring provider: Carin Hock, PA  HPI: 28 year old female, never smoker followed for severe asthma.  She has a history of severe asthma exacerbation secondary to rhinovirus requiring intubation and mechanical life support in early 2024.  She had further decompensation, requiring multiple vasopressors due to ongoing hemodynamic instability and ultimately cannulated for VV ECMO from 02/08/2022 through 02/10/2022.  She was successfully extubated on 02/17/2022.  She is a patient of Dr. Myrlene Mcneil and last seen in office 09/01/2022.  Past medical history significant for allergic rhinitis.  TEST/EVENTS:  02/2022 eos 300; positive RAST; IgE 1063 02/11/2022 CT chest without contrast: ET tube in place.  Bilateral lower lobe opacities, left greater than right.  Small bilateral pleural effusions.  09/01/2022: OV with Dr. Tonia Mcneil.  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  Supposed to have started on Dupixent injections.  Pharmacy team had contacted her but voicemail was full.  She has been doing okay but has had increased use of albuterol.  Mild exacerbation symptoms.  Using inhalers as directed.  Did receive a letter in the mail the Dupixent was approved so she called to make an appointment.  Follow-up in 6 to 8 weeks after Dupixent has started.  10/27/2022: OV with Kaitlyn Zell NP for follow-up after starting on Dupixent.  Her first injection was 9/9.  Tolerated well without any significant side effects.  Last injection was this week.  She has not had any issues with her breathing since she was here last.  Has not required any steroids or antibiotics.  She has not had to use her albuterol quite as frequently.  She did get her flu shot  earlier this week and had to use it on Monday and Tuesday but symptoms have improved since.  She denies any cough, wheezing, chest tightness, PND.  Using Dulera twice a day, Spiriva once a day.  Takes Claritin and Singulair daily.  Still has not seen allergy.  BP was slightly elevated this morning.  She has not taken her blood pressure medicine yet.  No headaches, dizziness, chest pain.  03/02/2023: Today - follow up Patient presents today for follow up. After our last visit, she started seeing allergy/asthma. She had a flare up in late December 2024 and was treated for possible pna with augmentin and azithromycin as well as prednisone taper. She recovered well. She has felt like her breathing has been stable since. No issues with shortness of breath, cough or wheezing. She denies any fevers, chills hemoptysis. Using her Elwin Sleight twice a day, spiriva once daily, and singulair. She is still on Dupixent. She hasn't had to use her rescue inhaler.   Allergies  Allergen Reactions   Beta Adrenergic Blockers Shortness Of Breath    Severe wheezing   Shellfish Allergy Anaphylaxis and Swelling   Tomato Itching and Other (See Comments)    Eczema flares     There is no immunization history on file for this patient.  Past Medical History:  Diagnosis Date   Asthma     Tobacco History: Social History   Tobacco Use  Smoking Status Never   Passive exposure: Current  Smokeless Tobacco Never   Counseling given: Not Answered  Outpatient Medications Prior to Visit  Medication Sig Dispense Refill   albuterol (VENTOLIN HFA) 108 (90 Base) MCG/ACT inhaler Inhale 2 puffs into the lungs every 6 (six) hours as needed for wheezing or shortness of breath.     Albuterol-Budesonide (AIRSUPRA) 90-80 MCG/ACT AERO Inhale 2 puffs into the lungs 4 (four) times daily as needed. 11 g 5   budesonide (PULMICORT) 0.5 MG/2ML nebulizer solution Take 2 mLs (0.5 mg total) by nebulization every 6 (six) hours as needed. Mix with  albuterol during flare ups. 200 mL 1   cetirizine (ZYRTEC) 10 MG tablet Take 1 tablet (10 mg total) by mouth daily as needed (Can take an extra dose during flare ups.). 180 tablet 1   diltiazem (CARDIZEM CD) 180 MG 24 hr capsule Take 180 mg by mouth daily.     DUPIXENT 300 MG/2ML SOAJ INJECT 300 MG UNDER THE SKIN EVERY 14 DAYS 12 mL 0   EPINEPHrine 0.3 mg/0.3 mL IJ SOAJ injection Inject 0.3 mg into the muscle as needed for anaphylaxis. Call 9-1-1 after use. May use second pen if needed 2 each 5   ipratropium-albuterol (DUONEB) 0.5-2.5 (3) MG/3ML SOLN Take 3 mLs by nebulization every 6 (six) hours as needed. Mix with budesonide during flare ups. 200 mL 5   loratadine (CLARITIN) 10 MG tablet Take 1 tablet (10 mg total) by mouth daily. 30 tablet 0   mometasone-formoterol (DULERA) 200-5 MCG/ACT AERO Inhale 2 puffs into the lungs 2 (two) times daily. 39 g 5   montelukast (SINGULAIR) 10 MG tablet Take 1 tablet (10 mg total) by mouth at bedtime. 90 tablet 5   Multiple Vitamin (MULTIVITAMIN WITH MINERALS) TABS tablet Take 1 tablet by mouth daily.     pimecrolimus (ELIDEL) 1 % cream Apply to skin twice daily as needed 100 g 1   Respiratory Therapy Supplies (NEBULIZER MASK ADULT) MISC 1 Device by Does not apply route as directed. 1 each 1   Tiotropium Bromide Monohydrate (SPIRIVA RESPIMAT) 2.5 MCG/ACT AERS Inhale 2 puffs into the lungs every morning. 12 g 5   triamcinolone (NASACORT) 55 MCG/ACT AERO nasal inhaler Place 2 sprays into the nose every morning. 50.7 mL 5   triamcinolone ointment (KENALOG) 0.1 % Apply 1 Application topically daily as needed (eczema flares). Can use below the neck. Avoid armpits and groin. 454 g 5   ferrous sulfate 325 (65 FE) MG tablet Take 1 tablet (325 mg total) by mouth 3 (three) times a week. 12 tablet 0   amoxicillin-clavulanate (AUGMENTIN) 875-125 MG tablet Take 1 tablet by mouth 2 (two) times daily. 14 tablet 0   azithromycin (ZITHROMAX) 250 MG tablet Take 2 tablets on the  first day, then one tablet once a day for the next 4 days 6 each 0   predniSONE (DELTASONE) 10 MG tablet Take one tablet twice a day for 4 days, then on the 5th day take one tablet and stop 9 tablet 0   No facility-administered medications prior to visit.     Review of Systems:   Constitutional: No weight loss or gain, night sweats, fevers, chills, fatigue, or lassitude. HEENT: No headaches, difficulty swallowing, tooth/dental problems, or sore throat. No sneezing, itching, ear ache, nasal congestion, or post nasal drip CV:  No chest pain, orthopnea, PND, swelling in lower extremities, anasarca, dizziness, palpitations, syncope Resp: No shortness of breath with exertion or at rest. No excess mucus or change in color of mucus. No productive or non-productive. No hemoptysis. No wheezing.  No chest  wall deformity GI:  No heartburn, indigestion GU: No dysuria, change in color of urine, urgency or frequency.   Skin: No rash, lesions, ulcerations MSK:  No joint pain or swelling.   Neuro: No dizziness or lightheadedness.  Psych: No depression or anxiety. Mood stable.     Physical Exam:  BP (!) 152/99 (BP Location: Right Arm, Cuff Size: Large)   Pulse 83   Temp 98.5 F (36.9 C) (Oral)   Ht 5' (1.524 m)   Wt 173 lb (78.5 kg)   SpO2 96%   BMI 33.79 kg/m   GEN: Pleasant, interactive, well-appearing; in no acute distress HEENT:  Normocephalic and atraumatic. PERRLA. Sclera white. Nasal turbinates pink, moist and patent bilaterally. No rhinorrhea present. Oropharynx pink and moist, without exudate or edema. No lesions, ulcerations, or postnasal drip.  NECK:  Supple w/ fair ROM. No JVD present. Normal carotid impulses w/o bruits. Thyroid symmetrical with no goiter or nodules palpated. No lymphadenopathy.   CV: RRR, no m/r/g, no peripheral edema. Pulses intact, +2 bilaterally. No cyanosis, pallor or clubbing. PULMONARY:  Unlabored, regular breathing. Clear bilaterally A&P w/o  wheezes/rales/rhonchi. No accessory muscle use.  GI: BS present and normoactive. Soft, non-tender to palpation. No organomegaly or masses detected.  MSK: No erythema, warmth or tenderness. Cap refil <2 sec all extrem. No deformities or joint swelling noted.  Neuro: A/Ox3. No focal deficits noted.   Skin: Warm, no lesions or rashe Psych: Normal affect and behavior. Judgement and thought content appropriate.     Lab Results:  CBC    Component Value Date/Time   WBC 11.9 (H) 02/28/2022 0645   RBC 3.56 (L) 02/28/2022 0645   HGB 9.9 (L) 02/28/2022 0645   HCT 29.9 (L) 02/28/2022 0645   PLT 333 02/28/2022 0645   MCV 84.0 02/28/2022 0645   MCH 27.8 02/28/2022 0645   MCHC 33.1 02/28/2022 0645   RDW 17.3 (H) 02/28/2022 0645   LYMPHSABS 1.8 02/26/2022 0549   MONOABS 0.9 02/26/2022 0549   EOSABS 0.7 (H) 02/26/2022 0549   BASOSABS 0.0 02/26/2022 0549    BMET    Component Value Date/Time   NA 135 02/28/2022 0645   K 3.8 02/28/2022 0645   CL 104 02/28/2022 0645   CO2 28 02/28/2022 0645   GLUCOSE 102 (H) 02/28/2022 0645   BUN 13 02/28/2022 0645   CREATININE 0.53 02/28/2022 0645   CALCIUM 9.2 02/28/2022 0645   GFRNONAA >60 02/28/2022 0645    BNP No results found for: "BNP"   Imaging:  No results found.  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 She was started on biologic therapy with Dupixent September 2024. Tolerated well thus far. She did have an exacerbation and clinical diagnosis of asthma in later December 2024 and treated with abx/steroids by provider at asthma/allergy. She has not had any recurrent symptoms.  If she does have future difficulties or recurrent flares, would consider switching her to [employer]. In interim, continue aggressive maintenance regimen. Avoid triggers. Check CXR today. Action plan in place.  Patient Instructions  Continue Albuterol inhaler 2 puffs every 6 hours as needed for shortness of breath or wheezing. Notify if symptoms persist despite rescue inhaler/neb use.  Continue dupixent injections every 14 days  Continue Dulera 2 puffs Twice daily. Brush tongue and rinse mouth afterwards Continue singulair 1 tab At bedtime  Continue spiriva 2 puffs daily Continue claritin 1 tab daily   Asthma action plan: START nebs up to four times a day for worsening shortness of breath, wheezing and cough. If you symptoms do not improve in 24-48 hours, contact us for steroid course. If your symptoms rapidly worsen, you have trouble talking or extreme difficulty breathing, or you're not getting relief from your nebulizer/rescue, go to the emergency department.  Follow up with Dr. Lucie Leather as scheduled - call his office about the CT sinus that you all had discussed completing. I don't see one ordered or scheduled  If you have more trouble with your asthma, will discuss with Dr. Lucie Leather about changing to [facility]  Chest x ray    Follow up in 4 months with Dr. Judeth Horn (new 30 min slot; 1st) or Rhunette Croft, NP. If symptoms worsen, please contact office for sooner follow up or seek emergency care.    Hypertension Elevated today. No associated symptoms. Advised to reach out to PCP to discuss antihypertensives. ED precautions reviewed.    I spent 31 minutes of dedicated to the care of this patient on the date of this encounter to include pre-visit review of records, face-to-face time with the patient discussing conditions above, post visit ordering of testing, clinical documentation with the electronic health record, making appropriate referrals as documented, and communicating necessary findings to  members of the patients care team.  Noemi Chapel, NP 03/02/2023  Pt aware and understands NP's role.

## 2023-03-02 NOTE — Patient Instructions (Addendum)
 Continue Albuterol inhaler 2 puffs every 6 hours as needed for shortness of breath or wheezing. Notify if symptoms persist despite rescue inhaler/neb use.  Continue dupixent injections every 14 days  Continue Dulera 2 puffs Twice daily. Brush tongue and rinse mouth afterwards Continue singulair 1 tab At bedtime  Continue spiriva 2 puffs daily Continue claritin 1 tab daily   Asthma action plan: START nebs up to four times a day for worsening shortness of breath, wheezing and cough. If you symptoms do not improve in 24-48 hours, contact us for steroid course. If your symptoms rapidly worsen, you have trouble talking or extreme difficulty breathing, or you're not getting relief from your nebulizer/rescue, go to the emergency department.  Follow up with Dr. Lucie Leather as scheduled - call his office about the CT sinus that you all had discussed completing. I don't see one ordered or scheduled  If you have more trouble with your asthma, will discuss with Dr. Lucie Leather about changing to [facility]  Chest x ray    Follow up in 4 months with Dr. Judeth Horn (new 30 min slot; 1st) or Rhunette Croft, NP. If symptoms worsen, please contact office for sooner follow up or seek emergency care.

## 2023-03-02 NOTE — Assessment & Plan Note (Signed)
 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 She was started on biologic therapy with Dupixent September 2024. Tolerated well thus far. She did have an exacerbation and clinical diagnosis of asthma in later December 2024 and treated with abx/steroids by provider at asthma/allergy. She has not had any recurrent symptoms. If she does have future difficulties or recurrent flares, would consider switching her to [employer]. In interim, continue aggressive maintenance regimen. Avoid triggers. Check CXR today. Action plan in place.  Patient Instructions  Continue Albuterol inhaler 2 puffs every 6 hours as needed for shortness of breath or wheezing. Notify if symptoms persist despite rescue inhaler/neb use.  Continue dupixent injections every 14 days  Continue Dulera 2 puffs Twice daily. Brush tongue and rinse mouth afterwards Continue singulair 1 tab At bedtime  Continue spiriva 2 puffs daily Continue claritin 1 tab daily   Asthma action plan: START nebs up to four times a day for worsening shortness of breath, wheezing and cough. If you symptoms do not improve in 24-48 hours, contact us for steroid course. If your symptoms rapidly worsen, you have trouble talking or extreme difficulty breathing, or you're not getting relief from your nebulizer/rescue, go to the emergency department.  Follow up with Dr. Lucie Leather as scheduled - call his office about the CT sinus that you all had discussed completing. I don't see one ordered or scheduled  If you have more trouble with your asthma, will discuss with Dr. Lucie Leather about changing to [facility]  Chest x ray    Follow up in 4 months with Dr. Judeth Horn (new 30 min slot; 1st) or Rhunette Croft, NP. If symptoms worsen, please contact office for sooner follow up or seek emergency care.

## 2023-03-02 NOTE — Assessment & Plan Note (Signed)
 Elevated today. No associated symptoms. Advised to reach out to PCP to discuss antihypertensives. ED precautions reviewed.

## 2023-03-26 ENCOUNTER — Ambulatory Visit
Admission: EM | Admit: 2023-03-26 | Discharge: 2023-03-26 | Disposition: A | Discharge status: Home / Self Care | Attending: Family Medicine | Admitting: Family Medicine

## 2023-03-26 DIAGNOSIS — J029 Acute pharyngitis, unspecified: Secondary | ICD-10-CM | POA: Insufficient documentation

## 2023-03-26 LAB — POCT RAPID STREP A (OFFICE): Rapid Strep A Screen: NEGATIVE

## 2023-03-26 MED ORDER — PREDNISONE 10 MG PO TABS: 10.0000 mg | ORAL_TABLET | Freq: Every day | ORAL | 0 refills | Status: AC

## 2023-03-26 NOTE — Discharge Instructions (Signed)
 Rapid strep test was negative.  I have ordered a throat culture if he shows any signs of bacterial growth we will contact you by phone and send medication to the pharmacy.  For now treat you for a viral sore throat with prednisone 10 mg once daily for 5 days.  Continue your other home medications as prescribed.  If symptoms have not resolved after completing prednisone return for reevaluation or follow-up with primary care doctor

## 2023-03-26 NOTE — ED Triage Notes (Signed)
 Pt presents with c/o tonsil swelling, and painful swallowing since this morning. Pt states she has not had a fever. Has taken allergy medicine and used her inhaler.

## 2023-03-26 NOTE — ED Provider Notes (Signed)
 [facility]    CSN: [id number] Arrival date & time: 03/26/23  1032      History   Chief Complaint Chief Complaint  Patient presents with   Sore Throat    HPI Kaitlyn Mcneil is a 28 y.o. female.    Sore Throat    Past Medical History:  Diagnosis Date   Asthma     Patient Active Problem List   Diagnosis Date Noted   Environmental allergies 10/27/2022   Hypertension 10/27/2022   Respiratory failure (HCC) 02/25/2022   Severe sepsis (HCC) 02/20/2022   Sinus tachycardia 02/20/2022   Delirium 02/20/2022   Generalized weakness 02/20/2022   Obesity (BMI 30-39.9) 02/20/2022   Lactic acidosis 02/20/2022   Severe asthma without complication 02/18/2022   Acute respiratory failure with hypoxia and hypercapnia (HCC) 02/18/2022   Acute metabolic encephalopathy 02/18/2022   Acute respiratory distress 02/07/2022    Past Surgical History:  Procedure Laterality Date   CANNULATION FOR ECMO (EXTRACORPOREAL MEMBRANE OXYGENATION) N/A 02/07/2022   Procedure: CANNULATION FOR ECMO (EXTRACORPOREAL MEMBRANE OXYGENATION);  Surgeon: Dolores Patty, MD;  Location: [facility] OR;  Service: Cardiovascular;  Laterality: N/A;   ECMO CANNULATION N/A 02/07/2022   Procedure: ECMO CANNULATION;  Surgeon: Dolores Patty, MD;  Location: [facility] INVASIVE CV LAB;  Service: Cardiovascular;  Laterality: N/A;   HERNIA REPAIR  2004   TEE WITHOUT CARDIOVERSION N/A 02/07/2022   Procedure: TRANSESOPHAGEAL ECHOCARDIOGRAM;  Surgeon: Dolores Patty, MD;  Location: [facility] INVASIVE CV LAB;  Service: Cardiovascular;  Laterality: N/A;    OB History   No obstetric history on file.      Home Medications    Prior to Admission medications   Medication Sig Start Date End Date Taking? Authorizing Provider  albuterol (VENTOLIN HFA) 108 (90 Base) MCG/ACT inhaler Inhale 2 puffs into the lungs every 6 (six) hours as needed for wheezing or shortness of breath.    [provider]   Albuterol-Budesonide (AIRSUPRA) 90-80 MCG/ACT AERO Inhale 2 puffs into the lungs 4 (four) times daily as needed. 01/24/23   Kozlow, Alvira Philips, MD  budesonide (PULMICORT) 0.5 MG/2ML nebulizer solution Take 2 mLs (0.5 mg total) by nebulization every 6 (six) hours as needed. Mix with albuterol during flare ups. 12/20/22   Kozlow, Alvira Philips, MD  cetirizine (ZYRTEC) 10 MG tablet Take 1 tablet (10 mg total) by mouth daily as needed (Can take an extra dose during flare ups.). 12/20/22   Kozlow, Alvira Philips, MD  diltiazem (CARDIZEM CD) 180 MG 24 hr capsule Take 180 mg by mouth daily.    [provider]  DUPIXENT 300 MG/2ML SOAJ INJECT 300 MG UNDER THE SKIN EVERY 14 DAYS 02/17/23   Cobb, Ruby Cola, NP  EPINEPHrine 0.3 mg/0.3 mL IJ SOAJ injection Inject 0.3 mg into the muscle as needed for anaphylaxis. Call 9-1-1 after use. May use second pen if needed 09/12/22   Icard, Bradley L, DO  ferrous sulfate 325 (65 FE) MG tablet Take 1 tablet (325 mg total) by mouth 3 (three) times a week. 03/01/22 03/01/23  Setzer, Lynnell Jude, PA-C  ipratropium-albuterol (DUONEB) 0.5-2.5 (3) MG/3ML SOLN Take 3 mLs by nebulization every 6 (six) hours as needed. Mix with budesonide during flare ups. 01/24/23   Kozlow, Alvira Philips, MD  loratadine (CLARITIN) 10 MG tablet Take 1 tablet (10 mg total) by mouth daily. 03/01/22   Setzer, Lynnell Jude, PA-C  mometasone-formoterol (DULERA) 200-5 MCG/ACT AERO Inhale 2 puffs into the lungs 2 (two) times daily. 01/24/23  Kozlow, Alvira Philips, MD  montelukast (SINGULAIR) 10 MG tablet Take 1 tablet (10 mg total) by mouth at bedtime. 01/24/23   Kozlow, Alvira Philips, MD  Multiple Vitamin (MULTIVITAMIN WITH MINERALS) TABS tablet Take 1 tablet by mouth daily. 03/01/22   Setzer, Lynnell Jude, PA-C  pimecrolimus (ELIDEL) 1 % cream Apply to skin twice daily as needed 12/20/22   Kozlow, Alvira Philips, MD  Respiratory Therapy Supplies (NEBULIZER MASK ADULT) MISC 1 Device by Does not apply route as directed. 12/20/22   Kozlow, Alvira Philips, MD  Tiotropium  Bromide Monohydrate (SPIRIVA RESPIMAT) 2.5 MCG/ACT AERS Inhale 2 puffs into the lungs every morning. 01/24/23   Kozlow, Alvira Philips, MD  triamcinolone (NASACORT) 55 MCG/ACT AERO nasal inhaler Place 2 sprays into the nose every morning. 01/24/23   Kozlow, Alvira Philips, MD  triamcinolone ointment (KENALOG) 0.1 % Apply 1 Application topically daily as needed (eczema flares). Can use below the neck. Avoid armpits and groin. 01/24/23   Kozlow, Alvira Philips, MD    Family History Family History  Problem Relation Age of Onset   Thyroid nodules Mother    High blood pressure Mother    Allergic rhinitis Father    Asthma Father    Eczema Sister    Asthma Brother     Social History Social History   Tobacco Use   Smoking status: Never    Passive exposure: Current   Smokeless tobacco: Never  Vaping Use   Vaping status: Former   Quit date: 12/02/2021   Substances: Flavoring  Substance Use Topics   Alcohol use: Yes    Comment: Social   Drug use: Never     Allergies   Beta adrenergic blockers, Shellfish allergy, and Tomato   Review of Systems Review of Systems   Physical Exam Triage Vital Signs ED Triage Vitals [03/26/23 1057]  Encounter Vitals Group     BP (!) 178/136     Systolic BP Percentile      Diastolic BP Percentile      Pulse Rate 84     Resp 20     Temp 99.6 F (37.6 C)     Temp Source Oral     SpO2 98 %     Weight      Height      Head Circumference      Peak Flow      Pain Score 0     Pain Loc      Pain Education      Exclude from Growth Chart    No data found.  Updated Vital Signs BP (!) 178/136 (BP Location: Right Arm)   Pulse 84   Temp 99.6 F (37.6 C) (Oral)   Resp 20   LMP 03/21/2023 (Exact Date)   SpO2 98%   Visual Acuity Right Eye Distance:   Left Eye Distance:   Bilateral Distance:    Right Eye Near:   Left Eye Near:    Bilateral Near:     Physical Exam   UC Treatments / Results  Labs (all labs ordered are listed, but only abnormal results are  displayed) Labs Reviewed - No data to display  EKG   Radiology No results found.  Procedures Procedures (including critical care time)  Medications Ordered in UC Medications - No data to display  Initial Impression / Assessment and Plan / UC Course  I have reviewed the triage vital signs and the nursing notes.  Pertinent labs & imaging results that were available during my  care of the patient were reviewed by me and considered in my medical decision making (see chart for details).     *** Final Clinical Impressions(s) / UC Diagnoses   Final diagnoses:  None   Discharge Instructions   None    ED Prescriptions   None    PDMP not reviewed this encounter.

## 2023-03-28 LAB — CULTURE, GROUP A STREP (THRC)

## 2023-04-18 ENCOUNTER — Ambulatory Visit: Payer: BC Managed Care – PPO | Admitting: Allergy and Immunology

## 2023-04-18 ENCOUNTER — Encounter: Payer: Self-pay | Admitting: Allergy and Immunology

## 2023-04-18 ENCOUNTER — Other Ambulatory Visit: Payer: Self-pay

## 2023-04-18 VITALS — BP 170/110 | HR 88 | Temp 98.4°F | Resp 14 | Ht 59.84 in | Wt 173.1 lb

## 2023-04-18 DIAGNOSIS — L2089 Other atopic dermatitis: Secondary | ICD-10-CM | POA: Diagnosis not present

## 2023-04-18 DIAGNOSIS — J3089 Other allergic rhinitis: Secondary | ICD-10-CM

## 2023-04-18 DIAGNOSIS — J4550 Severe persistent asthma, uncomplicated: Secondary | ICD-10-CM

## 2023-04-18 DIAGNOSIS — J301 Allergic rhinitis due to pollen: Secondary | ICD-10-CM | POA: Diagnosis not present

## 2023-04-18 DIAGNOSIS — Z91013 Allergy to seafood: Secondary | ICD-10-CM

## 2023-04-18 MED ORDER — MONTELUKAST SODIUM 10 MG PO TABS: 10.0000 mg | ORAL_TABLET | Freq: Every evening | ORAL | 5 refills | Status: AC

## 2023-04-18 MED ORDER — MOMETASONE FURO-FORMOTEROL FUM 200-5 MCG/ACT IN AERO: 2.0000 | INHALATION_SPRAY | Freq: Two times a day (BID) | RESPIRATORY_TRACT | 1 refills | Status: DC

## 2023-04-18 MED ORDER — PIMECROLIMUS 1 % EX CREA: TOPICAL_CREAM | CUTANEOUS | 1 refills | Status: DC

## 2023-04-18 MED ORDER — AIRSUPRA 90-80 MCG/ACT IN AERO: 2.0000 | INHALATION_SPRAY | Freq: Four times a day (QID) | RESPIRATORY_TRACT | 1 refills | Status: DC | PRN

## 2023-04-18 MED ORDER — TRIAMCINOLONE ACETONIDE 55 MCG/ACT NA AERO: 2.0000 | INHALATION_SPRAY | Freq: Every morning | NASAL | 5 refills | Status: DC

## 2023-04-18 MED ORDER — SPIRIVA RESPIMAT 2.5 MCG/ACT IN AERS: 2.0000 | INHALATION_SPRAY | Freq: Every morning | RESPIRATORY_TRACT | 1 refills | Status: DC

## 2023-04-18 MED ORDER — CETIRIZINE HCL 10 MG PO TABS: 10.0000 mg | ORAL_TABLET | Freq: Every day | ORAL | 1 refills | Status: AC | PRN

## 2023-04-18 MED ORDER — EPINEPHRINE 0.3 MG/0.3ML IJ SOAJ: 0.3000 mg | INTRAMUSCULAR | 5 refills | Status: AC | PRN

## 2023-04-18 MED ORDER — TRIAMCINOLONE ACETONIDE 0.1 % EX OINT: 1.0000 | TOPICAL_OINTMENT | Freq: Every day | CUTANEOUS | 5 refills | Status: AC | PRN

## 2023-04-18 NOTE — Progress Notes (Unsigned)
 [place] - [place] - [place] - [place] - [place]   Follow-up Note  Referring Provider: Carin Hock, [place] Primary Provider: Carin Hock, PA Date of Office Visit: 04/18/2023  Subjective:   Kaitlyn Mcneil (DOB: May 20, 1995) is a 28 y.o. female who [facility] in re-evaluation of the following:  HPI: Babe returns to this clinic in evaluation of asthma, allergic rhinitis, atopic dermatitis, shellfish allergy.  I last saw her in this clinic 24 January 2023.  During the interval she has done very well with her asthma.  Her requirement for short acting bronchodilator is 0.  She can exert herself without any problem.  She has not required a systemic steroid to treat an exacerbation.  She continues to use dupilumab and Dulera and Spiriva on a consistent basis.  She has very good control of her nasal issue while using montelukast and Nasacort.  She has not required an antibiotic treat an episode of sinusitis.  Her skin is doing quite well while using Elidel to her face and triamcinolone to her body when needed.  Allergies as of 04/18/2023       Reactions   Shellfish Allergy Anaphylaxis, Swelling   Beta Adrenergic Blockers Other (See Comments)   Severe wheezing   Tomato Itching, Other (See Comments)   Eczema flares        Medication List    Airsupra 90-80 MCG/ACT Aero Generic drug: Albuterol-Budesonide Inhale 2 puffs into the lungs 4 (four) times daily as needed.   budesonide 0.5 MG/2ML nebulizer solution Commonly known as: PULMICORT Take 2 mLs (0.5 mg total) by nebulization every 6 (six) hours as needed. Mix with albuterol during flare ups.   cetirizine 10 MG tablet Commonly known as: ZYRTEC Take 1 tablet (10 mg total) by mouth daily as needed (Can take an extra dose during flare ups.).   diltiazem 180 MG 24 hr capsule Commonly known as: CARDIZEM CD Take 180 mg by mouth daily.   Dupixent 300 MG/2ML  Soaj Generic drug: Dupilumab INJECT 300 MG UNDER THE SKIN EVERY 14 DAYS   EPINEPHrine 0.3 mg/0.3 mL Soaj injection Commonly known as: EPI-PEN Inject 0.3 mg into the muscle as needed for anaphylaxis. Call 9-1-1 after use. May use second pen if needed   ipratropium-albuterol 0.5-2.5 (3) MG/3ML Soln Commonly known as: DUONEB Take 3 mLs by nebulization every 6 (six) hours as needed. Mix with budesonide during flare ups.   loratadine 10 MG tablet Commonly known as: CLARITIN Take 1 tablet (10 mg total) by mouth daily.   mometasone-formoterol 200-5 MCG/ACT Aero Commonly known as: DULERA Inhale 2 puffs into the lungs 2 (two) times daily.   montelukast 10 MG tablet Commonly known as: SINGULAIR Take 1 tablet (10 mg total) by mouth at bedtime.   multivitamin with minerals Tabs tablet Take 1 tablet by mouth daily.   Nebulizer Mask Adult Misc 1 Device by Does not apply route as directed.   pimecrolimus 1 % cream Commonly known as: ELIDEL Apply to skin twice daily as needed   Spiriva Respimat 2.5 MCG/ACT Aers Generic drug: Tiotropium Bromide Monohydrate Inhale 2 puffs into the lungs every morning.   triamcinolone 55 MCG/ACT Aero nasal inhaler Commonly known as: NASACORT Place 2 sprays into the nose every morning.   triamcinolone ointment 0.1 % Commonly known as: KENALOG Apply 1 Application topically daily as needed (eczema flares). Can use below the neck. Avoid armpits and groin.    Past Medical History:  Diagnosis  Date   Asthma     Past Surgical History:  Procedure Laterality Date   CANNULATION FOR ECMO (EXTRACORPOREAL MEMBRANE OXYGENATION) N/A 02/07/2022   Procedure: CANNULATION FOR ECMO (EXTRACORPOREAL MEMBRANE OXYGENATION);  Surgeon: Mardell Shade, MD;  Location: [facility] OR;  Service: Cardiovascular;  Laterality: N/A;   ECMO CANNULATION N/A 02/07/2022   Procedure: ECMO CANNULATION;  Surgeon: Mardell Shade, MD;  Location: [facility] INVASIVE CV LAB;  Service:  Cardiovascular;  Laterality: N/A;   HERNIA REPAIR  2004   TEE WITHOUT CARDIOVERSION N/A 02/07/2022   Procedure: TRANSESOPHAGEAL ECHOCARDIOGRAM;  Surgeon: Mardell Shade, MD;  Location: [facility] INVASIVE CV LAB;  Service: Cardiovascular;  Laterality: N/A;    Review of systems negative except as noted in HPI / PMHx or noted below:  Review of Systems  Constitutional: Negative.   HENT: Negative.    Eyes: Negative.   Respiratory: Negative.    Cardiovascular: Negative.   Gastrointestinal: Negative.   Genitourinary: Negative.   Musculoskeletal: Negative.   Skin: Negative.   Neurological: Negative.   Endo/Heme/Allergies: Negative.   Psychiatric/Behavioral: Negative.       Objective:   Vitals:   04/18/23 1546 04/18/23 1641  BP: (!) 168/110 (!) 170/110  Pulse: 88   Resp: 14   Temp: 98.4 F (36.9 C)   SpO2: 98%    Height: 4' 11.84" (152 cm)  Weight: 173 lb 1.6 oz (78.5 kg)   Physical Exam Constitutional:      Appearance: She is not diaphoretic.  HENT:     Head: Normocephalic.     Right Ear: Tympanic membrane, ear canal and external ear normal.     Left Ear: Tympanic membrane, ear canal and external ear normal.     Nose: Nose normal. No mucosal edema or rhinorrhea.     Mouth/Throat:     Pharynx: Uvula midline. No oropharyngeal exudate.  Eyes:     Conjunctiva/sclera: Conjunctivae normal.  Neck:     Thyroid: No thyromegaly.     Trachea: Trachea normal. No tracheal tenderness or tracheal deviation.  Cardiovascular:     Rate and Rhythm: Normal rate and regular rhythm.     Heart sounds: Normal heart sounds, S1 normal and S2 normal. No murmur heard. Pulmonary:     Effort: No respiratory distress.     Breath sounds: Normal breath sounds. No stridor. No wheezing or rales.  Lymphadenopathy:     Head:     Right side of head: No tonsillar adenopathy.     Left side of head: No tonsillar adenopathy.     Cervical: No cervical adenopathy.  Skin:    Findings: No erythema or rash.      Nails: There is no clubbing.  Neurological:     Mental Status: She is alert.     Diagnostics: Spirometry was performed and demonstrated an FEV1 of 1.77 at 72 % of predicted.   Assessment and Plan:   1. Asthma, severe persistent, well-controlled   2. Perennial allergic rhinitis   3. Seasonal allergic rhinitis due to pollen   4. Other atopic dermatitis   5. Shellfish allergy    1. Treat and prevent inflammation of airway:   A. Dupilumab injections every 2 weeks  B. Dulera 200 - 2 inhalations 2 times per day (empty lungs)  C. Spiriva 2.5 Respimat - 2 inhalations 1 time per day (empty lungs)  D. Montelukast 10 mg - 1 tablet 1 time per day  E. OTC Nasacort - 2 sprays each nostril 1 time per  day  2. If needed:   A. Airsupra - 2 inhalations every 6 hours (replaces albuterol)  B. Cetirizine 10 mg - 1 tablet 1 time per day  C. Epi-Pen  3. "Action Plan" for flare up:   A. Duoneb + Budesonide 0.5 mg - TOGETHER 4 times per day  B. Prednisone 10 mg - take 4 tablets daily for 3 days C. Contact clinic on day one of flare up  4. Perform allergen avoidance measures - dust mite, animal, pollen, shellfish  5. Treat and prevent skin inflammation:   A. Triamcinolone 0.1% ointment - 1-7 times per day to body  B. Elidel - 1-7 times per day to face  7. Return to clinic in 12 weeks or earlier if problem  8. Influenza = Tamiflu. Covid = Paxlovid  Annleigh appears to be very stable on her current plan of using a large collection of anti-inflammatory agents for her airway which includes anti-IL-4/13 biologic agent and her skin issue appears to be under very good control as well.  Assuming she does well with this plan I will see her back in this clinic in 12 weeks or earlier if there is a problem.  Schuyler Custard, MD Allergy / Immunology [facility]

## 2023-04-18 NOTE — Patient Instructions (Addendum)
  1. Treat and prevent inflammation of airway:   A. Dupilumab injections every 2 weeks  B. Dulera 200 - 2 inhalations 2 times per day (empty lungs)  C. Spiriva 2.5 Respimat - 2 inhalations 1 time per day (empty lungs)  D. Montelukast 10 mg - 1 tablet 1 time per day  E. OTC Nasacort - 2 sprays each nostril 1 time per day  2. If needed:   A. Airsupra - 2 inhalations every 6 hours (replaces albuterol)  B. Cetirizine 10 mg - 1 tablet 1 time per day  C. Epi-Pen  3. "Action Plan" for flare up:   A. Duoneb + Budesonide 0.5 mg - TOGETHER 4 times per day  B. Prednisone 10 mg - take 4 tablets daily for 3 days C. Contact clinic on day one of flare up  4. Perform allergen avoidance measures - dust mite, animal, pollen, shellfish  5. Treat and prevent skin inflammation:   A. Triamcinolone 0.1% ointment - 1-7 times per day to body  B. Elidel - 1-7 times per day to face  7. Return to clinic in 12 weeks or earlier if problem  8. Influenza = Tamiflu. Covid = Paxlovid

## 2023-04-19 ENCOUNTER — Encounter: Payer: Self-pay | Admitting: Allergy and Immunology

## 2023-04-19 ENCOUNTER — Telehealth: Payer: Self-pay

## 2023-04-19 NOTE — Telephone Encounter (Signed)
*  Asthma/Allergy  Pharmacy Patient Advocate Encounter   Received notification from CoverMyMeds that prior authorization for Airsupra 90-80MCG/ACT aerosol  is required/requested.   Insurance verification completed.   The patient is insured through  [employer] and [employer]  .   Per test claim: PA required; PA started via CoverMyMeds. KEY [id number] . Please see clinical question(s) below that I am not finding the answer to in her chart and advise.   A brand name product has been requested. Is the patient able to use a generic albuterol HFA inhaler?

## 2023-04-20 NOTE — Telephone Encounter (Signed)
 Submitted with additional information on failure of albuterol.

## 2023-04-20 NOTE — Telephone Encounter (Signed)
 Airsupra has been denied due to no appropriate documentation in chart that patient has failed Albuterol HFA. Notes from CMA were included in request and they were not recognized. Notes in patient chart state that patient was stable on albuterol and had rare need to use it.

## 2023-04-20 NOTE — Telephone Encounter (Signed)
 I called the pharmacy and provided the Supra savings card. The Airsupra saving card did not work. Per he insurance it still wanted a PA to be completed.   Patient has tired Proventil HFA, Ventolin HFA, and Proventil/Ventolin HFA.

## 2023-04-24 ENCOUNTER — Other Ambulatory Visit: Payer: Self-pay | Admitting: Nurse Practitioner

## 2023-04-24 DIAGNOSIS — J4552 Severe persistent asthma with status asthmaticus: Secondary | ICD-10-CM

## 2023-04-24 NOTE — Telephone Encounter (Signed)
 Please advise specialty med

## 2023-04-25 NOTE — Telephone Encounter (Signed)
 Refill sent for DUPIXENT  to [facility]: [phone]  Dose: 300mg  subcut every 14 days  Last OV: 03/02/23 Provider: Canary Ceo, NP; will be establishing with Dr. Marygrace Snellen  Next OV: 07/04/23  Geraldene Kleine, PharmD, MPH, BCPS Clinical Pharmacist (Rheumatology and Pulmonology)

## 2023-04-26 NOTE — Telephone Encounter (Signed)
 Patient can't fail generic or brand version albuterol  due to it being an ingredient of the Airsupra  inhaler. Patient has tried both generic and name brand albuterol . Please advise what is needed documentation wise to get PAs for this medication approved.

## 2023-04-27 NOTE — Telephone Encounter (Signed)
 I called the Pharmacy with a different card and even though the PA was denied it still is requesting a PA to be completed in order for the patient to get Airsupra  per PA approval.    - Documentation is needed for PA to be approved

## 2023-04-27 NOTE — Telephone Encounter (Signed)
 Per PA team - Notes in patient's chart state that patient was stable on the albuterol  inhaler and had rare need to use it.    Prior Auths are getting denied due to no documentation of the patient having issues with using only the abluterol inhaler. This has been a common issue for Airsupra  Prior Authorizations. Some Insurances want a PA completed before we can use copay card for some patients    Per Dee Farber - patients should have documentation of being at risk of exacerbation from only using the albuterol  inhaler and in need of an inflammatory rescue inhaler.  ( Added steroid to their rescue inhaler)

## 2023-05-03 ENCOUNTER — Other Ambulatory Visit (HOSPITAL_COMMUNITY): Payer: Self-pay

## 2023-05-03 NOTE — Telephone Encounter (Signed)
 Per previous notes- PA has already been denied. Denial letter is in patients Media

## 2023-05-03 NOTE — Telephone Encounter (Signed)
 This could just be a processing issue at the patients pharmacy- or some processing issue with the copay card. If you wish to send it to a [facility] pharmacy along with the copay cards we can troubleshoot directly and try to get it filled.

## 2023-05-04 DIAGNOSIS — Z3200 Encounter for pregnancy test, result unknown: Secondary | ICD-10-CM | POA: Diagnosis not present

## 2023-05-04 DIAGNOSIS — Z202 Contact with and (suspected) exposure to infections with a predominantly sexual mode of transmission: Secondary | ICD-10-CM | POA: Diagnosis not present

## 2023-05-04 DIAGNOSIS — N898 Other specified noninflammatory disorders of vagina: Secondary | ICD-10-CM | POA: Diagnosis not present

## 2023-05-04 DIAGNOSIS — Z708 Other sex counseling: Secondary | ICD-10-CM | POA: Diagnosis not present

## 2023-05-19 ENCOUNTER — Other Ambulatory Visit: Payer: Self-pay

## 2023-05-19 MED ORDER — PIMECROLIMUS 1 % EX CREA: TOPICAL_CREAM | CUTANEOUS | 1 refills | Status: AC

## 2023-07-04 ENCOUNTER — Ambulatory Visit: Payer: BC Managed Care – PPO | Admitting: Pulmonary Disease

## 2023-07-04 ENCOUNTER — Encounter: Payer: Self-pay | Admitting: Pulmonary Disease

## 2023-07-04 DIAGNOSIS — J4552 Severe persistent asthma with status asthmaticus: Secondary | ICD-10-CM | POA: Diagnosis not present

## 2023-07-04 MED ORDER — DUPIXENT 300 MG/2ML ~~LOC~~ SOAJ: 300.0000 mg | SUBCUTANEOUS | 12 refills | Status: DC

## 2023-07-04 MED ORDER — DUPIXENT 300 MG/2ML ~~LOC~~ SOAJ: 300.0000 mg | SUBCUTANEOUS | 11 refills | Status: AC

## 2023-07-04 NOTE — Patient Instructions (Addendum)
 I am glad you are doing well  No changes in medication  Return clinic in 6 months or sooner as needed with Dr. Annella

## 2023-07-04 NOTE — Progress Notes (Signed)
 @Patient  ID: Kaitlyn Mcneil, female    DOB: 05-17-1995, 28 y.o.   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  Multiple prior pulmonary notes, most recent 3 via APP reviewed.  Most recent note from prior pulmonologist, Dr. Brenna, reviewed.  Overall doing well.  Started patient about a year ago per her report.  Had 1 exacerbation within the first 3 months after starting.  Since then none.  Breathing doing well.  Was using albuterol  on a daily basis prior.  Getting exacerbations 2-6 times a year.  Now using albuterol  once a month on average.  Reports good adherence to Dulera  and Spiriva .  Good adherence to Dupixent .  Most recent chest x-ray 02/2023 reviewed interpreted as clear lungs, mild hyperinflation noted on the lateral view.  We discussed expectant management.  Role for de-escalation in the future.  Questionaires / Pulmonary Flowsheets:   ACT:  Asthma Control Test ACT Total Score  03/14/2022  3:45 PM 12    MMRC:     No data to display          Epworth:      No data to display          Tests:   FENO:  No results found for: NITRICOXIDE  PFT:     No data to display          WALK:      No data to display          Imaging: Personally reviewed and as per EMR and discussion in this note No results found.  Lab Results: Personally reviewed CBC    Component Value Date/Time   WBC 11.9 (H) 02/28/2022 0645   RBC 3.56 (L) 02/28/2022 0645   HGB 9.9 (L) 02/28/2022 0645   HCT 29.9 (L) 02/28/2022 0645   PLT 333 02/28/2022 0645   MCV 84.0 02/28/2022 0645   MCH 27.8 02/28/2022 0645   MCHC 33.1 02/28/2022 0645   RDW 17.3 (H) 02/28/2022 0645   LYMPHSABS 1.8 02/26/2022 0549   MONOABS 0.9 02/26/2022 0549   EOSABS 0.7 (H) 02/26/2022 0549   BASOSABS 0.0  02/26/2022 0549    BMET    Component Value Date/Time   NA 135 02/28/2022 0645   K 3.8 02/28/2022 0645   CL 104 02/28/2022 0645   CO2 28 02/28/2022 0645   GLUCOSE 102 (H) 02/28/2022 0645   BUN 13 02/28/2022 0645   CREATININE 0.53 02/28/2022 0645   CALCIUM  9.2 02/28/2022 0645   GFRNONAA >60 02/28/2022 0645    BNP No results found for: BNP  ProBNP No results found for: PROBNP  Specialty Problems       Pulmonary Problems   Acute respiratory distress   Acute respiratory failure with hypoxia and hypercapnia (HCC)   Severe asthma without complication   Respiratory failure (HCC)    Allergies  Allergen Reactions   Shellfish Allergy Anaphylaxis and Swelling   Beta Adrenergic Blockers Other (See Comments)    Severe wheezing   Tomato Itching and Other (See Comments)    Eczema flares     There is no immunization history on file for this patient.  Past Medical History:  Diagnosis Date   Asthma     Tobacco History: Social History   Tobacco Use  Smoking Status Never  Passive exposure: Current  Smokeless Tobacco Never   Counseling given: Not Answered   Continue to not smoke  Outpatient Encounter Medications as of 07/04/2023  Medication Sig   Albuterol -Budesonide  (AIRSUPRA ) 90-80 MCG/ACT AERO Inhale 2 puffs into the lungs 4 (four) times daily as needed.   budesonide  (PULMICORT ) 0.5 MG/2ML nebulizer solution Take 2 mLs (0.5 mg total) by nebulization every 6 (six) hours as needed. Mix with albuterol  during flare ups.   cetirizine  (ZYRTEC ) 10 MG tablet Take 1 tablet (10 mg total) by mouth daily as needed (Can take an extra dose during flare ups.).   diltiazem  (CARDIZEM  CD) 180 MG 24 hr capsule Take 180 mg by mouth daily.   Dupilumab  (DUPIXENT ) 300 MG/2ML SOAJ Inject 300 mg into the skin every 14 (fourteen) days.   EPINEPHrine  0.3 mg/0.3 mL IJ SOAJ injection Inject 0.3 mg into the muscle as needed for anaphylaxis. Call 9-1-1 after use. May use second pen if needed    ipratropium-albuterol  (DUONEB) 0.5-2.5 (3) MG/3ML SOLN Take 3 mLs by nebulization every 6 (six) hours as needed. Mix with budesonide  during flare ups.   loratadine  (CLARITIN ) 10 MG tablet Take 1 tablet (10 mg total) by mouth daily.   mometasone -formoterol  (DULERA ) 200-5 MCG/ACT AERO Inhale 2 puffs into the lungs 2 (two) times daily.   montelukast  (SINGULAIR ) 10 MG tablet Take 1 tablet (10 mg total) by mouth at bedtime.   Multiple Vitamin (MULTIVITAMIN WITH MINERALS) TABS tablet Take 1 tablet by mouth daily.   pimecrolimus  (ELIDEL ) 1 % cream Apply 1%  topically 2(TWO) times a day as needed to face.   Respiratory Therapy Supplies (NEBULIZER MASK ADULT) MISC 1 Device by Does not apply route as directed.   Tiotropium Bromide  Monohydrate (SPIRIVA  RESPIMAT) 2.5 MCG/ACT AERS Inhale 2 puffs into the lungs every morning.   triamcinolone  (NASACORT ) 55 MCG/ACT AERO nasal inhaler Place 2 sprays into the nose every morning.   triamcinolone  ointment (KENALOG ) 0.1 % Apply 1 Application topically daily as needed (eczema flares). Can use below the neck. Avoid armpits and groin.   [DISCONTINUED] Dupilumab  (DUPIXENT ) 300 MG/2ML SOAJ Inject 300 mg into the skin every 14 (fourteen) days.   [DISCONTINUED] DUPIXENT  300 MG/2ML SOAJ INJECT 300 MG UNDER THE SKIN EVERY 14 DAYS   No facility-administered encounter medications on file as of 07/04/2023.     Review of Systems  Review of Systems  No chest pain with exertion.  Orthopnea or PND.  Comprehensive review seems otherwise negative. Physical Exam  BP (!) 140/84 (BP Location: Left Arm, Patient Position: Sitting, Cuff Size: Large)   Pulse 93   Ht 5' (1.524 m)   Wt 175 lb (79.4 kg)   SpO2 97%   BMI 34.18 kg/m   Wt Readings from Last 5 Encounters:  07/04/23 175 lb (79.4 kg)  04/18/23 173 lb 1.6 oz (78.5 kg)  03/02/23 173 lb (78.5 kg)  01/24/23 166 lb 14.4 oz (75.7 kg)  01/03/23 168 lb 4.8 oz (76.3 kg)    BMI Readings from Last 5 Encounters:  07/04/23  34.18 kg/m  04/18/23 33.98 kg/m  03/02/23 33.79 kg/m  01/24/23 32.60 kg/m  01/03/23 32.87 kg/m     Physical Exam General: Sitting in chair, no acute distress Eyes: EOMI, no icterus Neck: Supple, no JVP Pulmonary: Clear, no work of breathing Abdomen: Nondistended, bowel sounds present MSK: No synovitis, no genu varum Neuro: Normal gait, no weakness MSK: No synovitis, no joint effusion Psych: Normal mood, full affect  Assessment & Plan:  Severe persistent eosinophilic asthma: Escalated to Dupixent  summer 2025 after recurrent exacerbations.  Only 1 in the last year.  This was within the first 3 months of starting Dupixent .  Now well-controlled.  Albuterol  use about once a month.  On triple inhaled therapy.  To continue.  Dupixent  refilled today.   Return in about 6 months (around 01/04/2024) for f/u Dr. Annella.   Kaitlyn JONELLE Annella, MD 07/04/2023   This appointment required 33 minutes of patient care (this includes precharting, chart review, review of results, face-to-face care, etc.).

## 2023-07-12 NOTE — Progress Notes (Deleted)
   [address] [place] [address] Dept: [phone]  FOLLOW UP NOTE  [name], female    DOB: [birth year]  Age: 28 y.o. MRN: [id number] Date of Office Visit: 07/13/2023  Assessment  Chief Complaint: No chief complaint on file.  HPI Kaitlyn Mcneil is a 28 year old female who presents to clinic for follow-up visit.  She was last seen in this clinic on 04/18/2023 by Dr. Kozlow for evaluation of asthma, allergic rhinitis, atopic dermatitis, and food allergy to shellfish.  Discussed the use of AI scribe software for clinical note transcription with the patient, who gave verbal consent to proceed.  History of Present Illness      Drug Allergies:  Allergies  Allergen Reactions   Shellfish Allergy Anaphylaxis and Swelling   Beta Adrenergic Blockers Other (See Comments)    Severe wheezing   Tomato Itching and Other (See Comments)    Eczema flares    Physical Exam: There were no vitals taken for this visit.   Physical Exam  Diagnostics:    Assessment and Plan: No diagnosis found.  No orders of the defined types were placed in this encounter.   There are no Patient Instructions on file for this visit.  No follow-ups on file.    Thank you for the opportunity to care for this patient.  Please do not hesitate to contact me with questions.  Arlean Mutter, FNP [facility]

## 2023-07-12 NOTE — Patient Instructions (Incomplete)
  1. Treat and prevent inflammation of airway:   A. Dupilumab injections every 2 weeks  B. Dulera 200 - 2 inhalations 2 times per day (empty lungs)  C. Spiriva 2.5 Respimat - 2 inhalations 1 time per day (empty lungs)  D. Montelukast 10 mg - 1 tablet 1 time per day  E. OTC Nasacort - 2 sprays each nostril 1 time per day  2. If needed:   A. Airsupra - 2 inhalations every 6 hours (replaces albuterol)  B. Cetirizine 10 mg - 1 tablet 1 time per day  C. Epi-Pen  3. "Action Plan" for flare up:   A. Duoneb + Budesonide 0.5 mg - TOGETHER 4 times per day  B. Prednisone 10 mg - take 4 tablets daily for 3 days C. Contact clinic on day one of flare up  4. Perform allergen avoidance measures - dust mite, animal, pollen, shellfish  5. Treat and prevent skin inflammation:   A. Triamcinolone 0.1% ointment - 1-7 times per day to body  B. Elidel - 1-7 times per day to face  7. Return to clinic in 12 weeks or earlier if problem  8. Influenza = Tamiflu. Covid = Paxlovid

## 2023-07-13 ENCOUNTER — Ambulatory Visit: Payer: Self-pay | Admitting: Family Medicine

## 2023-07-13 DIAGNOSIS — J309 Allergic rhinitis, unspecified: Secondary | ICD-10-CM

## 2023-07-19 DIAGNOSIS — B3731 Acute candidiasis of vulva and vagina: Secondary | ICD-10-CM | POA: Diagnosis not present

## 2023-07-19 DIAGNOSIS — N760 Acute vaginitis: Secondary | ICD-10-CM | POA: Diagnosis not present

## 2023-07-19 DIAGNOSIS — Z7251 High risk heterosexual behavior: Secondary | ICD-10-CM | POA: Diagnosis not present

## 2023-07-19 DIAGNOSIS — R300 Dysuria: Secondary | ICD-10-CM | POA: Diagnosis not present

## 2023-07-24 DIAGNOSIS — Z7251 High risk heterosexual behavior: Secondary | ICD-10-CM | POA: Diagnosis not present

## 2023-07-24 DIAGNOSIS — R300 Dysuria: Secondary | ICD-10-CM | POA: Diagnosis not present

## 2023-09-08 DIAGNOSIS — J069 Acute upper respiratory infection, unspecified: Secondary | ICD-10-CM | POA: Diagnosis not present

## 2023-09-08 DIAGNOSIS — N760 Acute vaginitis: Secondary | ICD-10-CM | POA: Diagnosis not present

## 2023-09-21 ENCOUNTER — Observation Stay (HOSPITAL_COMMUNITY)
Admission: EM | Admit: 2023-09-21 | Discharge: 2023-09-22 | Disposition: A | Discharge status: Home / Self Care | Attending: Family Medicine | Admitting: Family Medicine

## 2023-09-21 ENCOUNTER — Emergency Department (HOSPITAL_COMMUNITY)

## 2023-09-21 ENCOUNTER — Ambulatory Visit (HOSPITAL_COMMUNITY): Admission: EM | Admit: 2023-09-21 | Discharge: 2023-09-21 | Disposition: A | Discharge status: Home / Self Care

## 2023-09-21 ENCOUNTER — Encounter (HOSPITAL_COMMUNITY): Payer: Self-pay

## 2023-09-21 ENCOUNTER — Other Ambulatory Visit: Payer: Self-pay

## 2023-09-21 DIAGNOSIS — I10 Essential (primary) hypertension: Secondary | ICD-10-CM | POA: Diagnosis not present

## 2023-09-21 DIAGNOSIS — R519 Headache, unspecified: Secondary | ICD-10-CM | POA: Diagnosis not present

## 2023-09-21 DIAGNOSIS — R200 Anesthesia of skin: Secondary | ICD-10-CM | POA: Diagnosis not present

## 2023-09-21 DIAGNOSIS — I160 Hypertensive urgency: Secondary | ICD-10-CM | POA: Diagnosis present

## 2023-09-21 DIAGNOSIS — H538 Other visual disturbances: Secondary | ICD-10-CM | POA: Diagnosis not present

## 2023-09-21 DIAGNOSIS — R202 Paresthesia of skin: Secondary | ICD-10-CM | POA: Insufficient documentation

## 2023-09-21 DIAGNOSIS — R531 Weakness: Secondary | ICD-10-CM | POA: Diagnosis not present

## 2023-09-21 DIAGNOSIS — I169 Hypertensive crisis, unspecified: Secondary | ICD-10-CM | POA: Diagnosis present

## 2023-09-21 DIAGNOSIS — R079 Chest pain, unspecified: Secondary | ICD-10-CM | POA: Diagnosis not present

## 2023-09-21 LAB — I-STAT CHEM 8, ED
BUN: 18 mg/dL (ref 6–20)
Calcium, Ion: 1.23 mmol/L (ref 1.15–1.40)
Chloride: 99 mmol/L (ref 98–111)
Creatinine, Ser: 1 mg/dL (ref 0.44–1.00)
Glucose, Bld: 97 mg/dL (ref 70–99)
HCT: 41 % (ref 36.0–46.0)
Hemoglobin: 13.9 g/dL (ref 12.0–15.0)
Potassium: 3.1 mmol/L — ABNORMAL LOW (ref 3.5–5.1)
Sodium: 139 mmol/L (ref 135–145)
TCO2: 26 mmol/L (ref 22–32)

## 2023-09-21 LAB — CBC
HCT: 39 % (ref 36.0–46.0)
Hemoglobin: 12.4 g/dL (ref 12.0–15.0)
MCH: 28.8 pg (ref 26.0–34.0)
MCHC: 31.8 g/dL (ref 30.0–36.0)
MCV: 90.5 fL (ref 80.0–100.0)
Platelets: 248 K/uL (ref 150–400)
RBC: 4.31 MIL/uL (ref 3.87–5.11)
RDW: 13.6 % (ref 11.5–15.5)
WBC: 12.4 K/uL — ABNORMAL HIGH (ref 4.0–10.5)
nRBC: 0 % (ref 0.0–0.2)

## 2023-09-21 LAB — BASIC METABOLIC PANEL WITH GFR
Anion gap: 13 (ref 5–15)
BUN: 18 mg/dL (ref 6–20)
CO2: 23 mmol/L (ref 22–32)
Calcium: 9.4 mg/dL (ref 8.9–10.3)
Chloride: 99 mmol/L (ref 98–111)
Creatinine, Ser: 0.81 mg/dL (ref 0.44–1.00)
GFR, Estimated: >60 mL/min (ref >60)
Glucose, Bld: 92 mg/dL (ref 70–99)
Potassium: 3.2 mmol/L — ABNORMAL LOW (ref 3.5–5.1)
Sodium: 135 mmol/L (ref 135–145)

## 2023-09-21 LAB — HCG, SERUM, QUALITATIVE: Preg, Serum: NEGATIVE

## 2023-09-21 LAB — TROPONIN I (HIGH SENSITIVITY): Troponin I (High Sensitivity): 29 ng/L — ABNORMAL HIGH (ref <18)

## 2023-09-21 MED ORDER — METOCLOPRAMIDE HCL 5 MG/ML IJ SOLN
10.0000 mg | Freq: Once | INTRAMUSCULAR | Status: AC
  Administered 2023-09-21: 10 mg via INTRAVENOUS
  Filled 2023-09-21: qty 2

## 2023-09-21 MED ORDER — HYDRALAZINE HCL 20 MG/ML IJ SOLN
10.0000 mg | Freq: Once | INTRAMUSCULAR | Status: AC
  Administered 2023-09-21: 10 mg via INTRAVENOUS
  Filled 2023-09-21: qty 1

## 2023-09-21 NOTE — ED Notes (Signed)
 Pt reports she is unsure of how often she should be taking cardizem  and last took it this morning around 5am. Pt c/o 3/10 headache and states she's had blurry vissioon and difficulty reading text on phone. MD notified of BP 232/150.

## 2023-09-21 NOTE — Discharge Instructions (Addendum)
 Due to the significantly elevated blood pressure along with the headache and blurred vision, we recommend further evaluation in the emergency room where more advanced workup can be obtained.

## 2023-09-21 NOTE — ED Triage Notes (Signed)
 Pt c/o HTN x2 months. C/o blurred visio with headaches x3 days. States was on Diltiazem  x4 a day and now just taken once a day.

## 2023-09-21 NOTE — ED Triage Notes (Signed)
 Pt seen at [facility] today and BP was 200. Pt having blurred vision, BL leg numbness and HA. Has hx of htn and is compliant with meds. Denies CP SOB

## 2023-09-21 NOTE — ED Triage Notes (Signed)
 Pt reports having high blood pressure. Pt is reporting vision changes and tingling in her feet.

## 2023-09-21 NOTE — ED Provider Notes (Signed)
 Kaitlyn Mcneil EMERGENCY DEPARTMENT AT [facility] Provider Note   CSN: [id number] Arrival date & time: 09/21/23  8086     Patient presents with: Hypertension   Kaitlyn Mcneil is a 28 y.o. female.  {Add pertinent medical, surgical, social history, OB history to [id number]} The history is provided by the patient and medical records.  Hypertension  Kaitlyn Mcneil is a 28 y.o. female who presents to the Emergency Department complaining of *** HA and blurry vision for three days.  HA started today. Numb in left leg started today - 430.  Walks slower . No weakness. Having trouble reading text and seeing screen. Left sided frontal headache, comes and goes.   No fever, n/v, CP, AP.  Hx/o asthma, eczema, HTN, PCOS.  Takes diltiazem180mg . Took today.  Missed yesterday.  Went to doctor two weeks ago and it was high.       Prior to Admission medications   Medication Sig Start Date End Date Taking? Authorizing Provider  Albuterol -Budesonide  (AIRSUPRA ) 90-80 MCG/ACT AERO Inhale 2 puffs into the lungs 4 (four) times daily as needed. 04/18/23   Kozlow, Camellia PARAS, MD  budesonide  (PULMICORT ) 0.5 MG/2ML nebulizer solution Take 2 mLs (0.5 mg total) by nebulization every 6 (six) hours as needed. Mix with albuterol  during flare ups. 12/20/22   Kozlow, Camellia PARAS, MD  cetirizine  (ZYRTEC ) 10 MG tablet Take 1 tablet (10 mg total) by mouth daily as needed (Can take an extra dose during flare ups.). 04/18/23   Kozlow, Camellia PARAS, MD  diltiazem  (CARDIZEM  CD) 180 MG 24 hr capsule Take 180 mg by mouth daily.    [provider]  Dupilumab  (DUPIXENT ) 300 MG/2ML SOAJ Inject 300 mg into the skin every 14 (fourteen) days. 07/04/23   Hunsucker, Donnice SAUNDERS, MD  EPINEPHrine  0.3 mg/0.3 mL IJ SOAJ injection Inject 0.3 mg into the muscle as needed for anaphylaxis. Call 9-1-1 after use. May use second pen if needed 04/18/23   Kozlow, Camellia PARAS, MD  ipratropium-albuterol  (DUONEB) 0.5-2.5 (3) MG/3ML SOLN Take 3 mLs by  nebulization every 6 (six) hours as needed. Mix with budesonide  during flare ups. 01/24/23   Kozlow, Camellia PARAS, MD  loratadine  (CLARITIN ) 10 MG tablet Take 1 tablet (10 mg total) by mouth daily. 03/01/22   Setzer, Sandra J, PA-C  mometasone -formoterol  (DULERA ) 200-5 MCG/ACT AERO Inhale 2 puffs into the lungs 2 (two) times daily. 04/18/23   Kozlow, Camellia PARAS, MD  montelukast  (SINGULAIR ) 10 MG tablet Take 1 tablet (10 mg total) by mouth at bedtime. 04/18/23   Kozlow, Eric J, MD  Multiple Vitamin (MULTIVITAMIN WITH MINERALS) TABS tablet Take 1 tablet by mouth daily. 03/01/22   Setzer, Sandra J, PA-C  pimecrolimus  (ELIDEL ) 1 % cream Apply 1%  topically 2(TWO) times a day as needed to face. 05/19/23   Kozlow, Camellia PARAS, MD  Respiratory Therapy Supplies (NEBULIZER MASK ADULT) MISC 1 Device by Does not apply route as directed. 12/20/22   Kozlow, Camellia PARAS, MD  Tiotropium Bromide  Monohydrate (SPIRIVA  RESPIMAT) 2.5 MCG/ACT AERS Inhale 2 puffs into the lungs every morning. 04/18/23   Kozlow, Camellia PARAS, MD  triamcinolone  (NASACORT ) 55 MCG/ACT AERO nasal inhaler Place 2 sprays into the nose every morning. 04/18/23   Kozlow, Eric J, MD  triamcinolone  ointment (KENALOG ) 0.1 % Apply 1 Application topically daily as needed (eczema flares). Can use below the neck. Avoid armpits and groin. 04/18/23   Kozlow, Camellia PARAS, MD    Allergies: Shellfish allergy, Beta adrenergic blockers, and  Tomato    Review of Systems  All other systems reviewed and are negative.   Updated Vital Signs BP (!) 232/150   Pulse 81   Temp 98.4 F (36.9 C)   Resp 17   Ht 5' (1.524 m)   Wt 77.1 kg   LMP 09/21/2023 (Exact Date)   SpO2 100%   BMI 33.20 kg/m   Physical Exam Vitals and nursing note reviewed.  Constitutional:      Appearance: She is well-developed.  HENT:     Head: Normocephalic and atraumatic.  Cardiovascular:     Rate and Rhythm: Normal rate and regular rhythm.     Heart sounds: No murmur heard. Pulmonary:     Effort: Pulmonary effort  is normal. No respiratory distress.     Breath sounds: Normal breath sounds.  Abdominal:     Palpations: Abdomen is soft.     Tenderness: There is no abdominal tenderness. There is no guarding or rebound.  Musculoskeletal:        General: No tenderness.  Skin:    General: Skin is warm and dry.  Neurological:     Mental Status: She is alert and oriented to person, place, and time.     Comments: No asymmetry of facial movements, visual fields grossly intact.  5/5 strength in BUE. Slight weakness in LLE. 5/5 strength in RLE.  Sensation to light touch intact in all four extremities.    Psychiatric:        Behavior: Behavior normal.     (all labs ordered are listed, but only abnormal results are displayed) Labs Reviewed  BASIC METABOLIC PANEL WITH GFR - Abnormal; Notable for the following components:      Result Value   Potassium 3.2 (*)    All other components within normal limits  CBC - Abnormal; Notable for the following components:   WBC 12.4 (*)    All other components within normal limits  I-STAT CHEM 8, ED - Abnormal; Notable for the following components:   Potassium 3.1 (*)    All other components within normal limits  TROPONIN I (HIGH SENSITIVITY) - Abnormal; Notable for the following components:   Troponin I (High Sensitivity) 29 (*)    All other components within normal limits  HCG, SERUM, QUALITATIVE  TROPONIN I (HIGH SENSITIVITY)    EKG: EKG Interpretation Date/Time:  Thursday September 21 2023 21:55:18 EDT Ventricular Rate:  80 PR Interval:  170 QRS Duration:  84 QT Interval:  384 QTC Calculation: 442 R Axis:   88  Text Interpretation: Normal sinus rhythm Nonspecific T wave abnormality Abnormal ECG Confirmed by Griselda Norris [phone]) on 09/21/2023 11:07:11 PM  Radiology: [facility] Chest 2 View Result Date: 09/21/2023 CLINICAL DATA:  Hypertension, blurred vision, chest pain EXAM: CHEST - 2 VIEW COMPARISON:  03/02/2023 FINDINGS: Frontal and lateral views of the chest  demonstrate an unremarkable cardiac silhouette. No acute airspace disease, effusion, or pneumothorax. No acute bony abnormalities. IMPRESSION: 1. No acute intrathoracic process. Electronically Signed   By: Ozell Daring M.D.   On: 09/21/2023 22:14    {Document cardiac monitor, telemetry assessment procedure when appropriate:32947} Procedures   Medications Ordered in the ED - No data to display    {Click here for ABCD2, HEART and other calculators REFRESH Note before signing:1}                              Medical Decision Making Amount and/or Complexity of  Data Reviewed Labs: ordered. Radiology: ordered.   ***  {Document critical care time when appropriate  Document review of labs and clinical decision tools ie CHADS2VASC2, etc  Document your independent review of radiology images and any outside records  Document your discussion with family members, caretakers and with consultants  Document social determinants of health affecting pt's care  Document your decision making why or why not admission, treatments were needed:32947:::1}   Final diagnoses:  None    ED Discharge Orders     None

## 2023-09-21 NOTE — ED Provider Notes (Signed)
 Briefly, 28 y.o. female who presents to urgent care with complaints of blurred vision, headache and severely elevated blood pressure for 3 days. Her symptoms have not improved and she was concerned as they are persistent. She has diltiazem  and has decreased her dose. She denies any slurred speech.   Due to the significantly elevated blood pressure along with the headache and blurred vision, we recommend further evaluation in the emergency room where more advanced workup can be obtained.   Teresa Almarie LABOR, [place] 09/21/23 1907

## 2023-09-21 NOTE — ED Notes (Signed)
 Patient is being discharged from the Urgent Care and sent to the Emergency Department via POV . Per Vertell, Kaitlyn Mcneil, patient is in need of higher level of care due to HTN. Patient is aware and verbalizes understanding of plan of care.  Vitals:   09/21/23 1857  BP: (!) 233/158  Pulse: 86  Resp: 18  Temp: 98.1 F (36.7 C)  SpO2: 100%

## 2023-09-22 ENCOUNTER — Emergency Department (HOSPITAL_COMMUNITY)

## 2023-09-22 DIAGNOSIS — I10 Essential (primary) hypertension: Secondary | ICD-10-CM | POA: Diagnosis not present

## 2023-09-22 DIAGNOSIS — I169 Hypertensive crisis, unspecified: Secondary | ICD-10-CM | POA: Diagnosis present

## 2023-09-22 DIAGNOSIS — R519 Headache, unspecified: Secondary | ICD-10-CM | POA: Diagnosis not present

## 2023-09-22 DIAGNOSIS — R531 Weakness: Secondary | ICD-10-CM | POA: Diagnosis not present

## 2023-09-22 LAB — COMPREHENSIVE METABOLIC PANEL WITH GFR
ALT: 34 U/L (ref 0–44)
AST: 27 U/L (ref 15–41)
Albumin: 3.9 g/dL (ref 3.5–5.0)
Alkaline Phosphatase: 62 U/L (ref 38–126)
Anion gap: 14 (ref 5–15)
BUN: 14 mg/dL (ref 6–20)
CO2: 23 mmol/L (ref 22–32)
Calcium: 9.2 mg/dL (ref 8.9–10.3)
Chloride: 100 mmol/L (ref 98–111)
Creatinine, Ser: 0.79 mg/dL (ref 0.44–1.00)
GFR, Estimated: >60 mL/min (ref >60)
Glucose, Bld: 106 mg/dL — ABNORMAL HIGH (ref 70–99)
Potassium: 3.5 mmol/L (ref 3.5–5.1)
Sodium: 137 mmol/L (ref 135–145)
Total Bilirubin: 0.8 mg/dL (ref 0.0–1.2)
Total Protein: 7.2 g/dL (ref 6.5–8.1)

## 2023-09-22 LAB — CBC WITH DIFFERENTIAL/PLATELET
Abs Immature Granulocytes: 0.09 K/uL — ABNORMAL HIGH (ref 0.00–0.07)
Basophils Absolute: 0.1 K/uL (ref 0.0–0.1)
Basophils Relative: 0 %
Eosinophils Absolute: 0.5 K/uL (ref 0.0–0.5)
Eosinophils Relative: 3 %
HCT: 37.7 % (ref 36.0–46.0)
Hemoglobin: 12.3 g/dL (ref 12.0–15.0)
Immature Granulocytes: 1 %
Lymphocytes Relative: 10 %
Lymphs Abs: 1.6 K/uL (ref 0.7–4.0)
MCH: 28.7 pg (ref 26.0–34.0)
MCHC: 32.6 g/dL (ref 30.0–36.0)
MCV: 87.9 fL (ref 80.0–100.0)
Monocytes Absolute: 1.2 K/uL — ABNORMAL HIGH (ref 0.1–1.0)
Monocytes Relative: 8 %
Neutro Abs: 12.2 K/uL — ABNORMAL HIGH (ref 1.7–7.7)
Neutrophils Relative %: 78 %
Platelets: 248 K/uL (ref 150–400)
RBC: 4.29 MIL/uL (ref 3.87–5.11)
RDW: 13.3 % (ref 11.5–15.5)
WBC: 15.7 K/uL — ABNORMAL HIGH (ref 4.0–10.5)
nRBC: 0 % (ref 0.0–0.2)

## 2023-09-22 LAB — MAGNESIUM: Magnesium: 1.9 mg/dL (ref 1.7–2.4)

## 2023-09-22 LAB — TROPONIN I (HIGH SENSITIVITY): Troponin I (High Sensitivity): 29 ng/L — ABNORMAL HIGH (ref <18)

## 2023-09-22 MED ORDER — POTASSIUM CHLORIDE CRYS ER 20 MEQ PO TBCR
40.0000 meq | EXTENDED_RELEASE_TABLET | Freq: Once | ORAL | Status: AC
  Administered 2023-09-22: 40 meq via ORAL
  Filled 2023-09-22: qty 2

## 2023-09-22 MED ORDER — CLONIDINE HCL 0.2 MG PO TABS
0.2000 mg | ORAL_TABLET | Freq: Once | ORAL | Status: DC
  Filled 2023-09-22: qty 1

## 2023-09-22 MED ORDER — DILTIAZEM HCL ER COATED BEADS 180 MG PO CP24
180.0000 mg | ORAL_CAPSULE | Freq: Every day | ORAL | Status: DC
  Administered 2023-09-22: 180 mg via ORAL
  Filled 2023-09-22: qty 1

## 2023-09-22 MED ORDER — ACETAMINOPHEN 325 MG PO TABS: 650.0000 mg | ORAL_TABLET | Freq: Four times a day (QID) | ORAL | Status: DC | PRN

## 2023-09-22 MED ORDER — HYDRALAZINE HCL 20 MG/ML IJ SOLN
10.0000 mg | INTRAMUSCULAR | Status: DC | PRN
  Administered 2023-09-22: 10 mg via INTRAVENOUS
  Filled 2023-09-22: qty 1

## 2023-09-22 MED ORDER — ONDANSETRON HCL 4 MG/2ML IJ SOLN: 4.0000 mg | Freq: Four times a day (QID) | INTRAMUSCULAR | Status: DC | PRN

## 2023-09-22 MED ORDER — POTASSIUM CHLORIDE CRYS ER 20 MEQ PO TBCR
20.0000 meq | EXTENDED_RELEASE_TABLET | Freq: Once | ORAL | Status: AC
  Administered 2023-09-22: 20 meq via ORAL
  Filled 2023-09-22: qty 1

## 2023-09-22 MED ORDER — CLONIDINE HCL 0.1 MG PO TABS: ORAL_TABLET | ORAL | 0 refills | Status: AC

## 2023-09-22 MED ORDER — DILTIAZEM HCL ER COATED BEADS 180 MG PO CP24: 180.0000 mg | ORAL_CAPSULE | Freq: Every day | ORAL | 6 refills | Status: AC

## 2023-09-22 MED ORDER — ACETAMINOPHEN 650 MG RE SUPP: 650.0000 mg | Freq: Four times a day (QID) | RECTAL | Status: DC | PRN

## 2023-09-22 MED ORDER — ALBUTEROL SULFATE (2.5 MG/3ML) 0.083% IN NEBU: 2.5000 mg | INHALATION_SOLUTION | RESPIRATORY_TRACT | Status: DC | PRN

## 2023-09-22 MED ORDER — GADOBUTROL 1 MMOL/ML IV SOLN
8.0000 mL | Freq: Once | INTRAVENOUS | Status: AC | PRN
  Administered 2023-09-22: 8 mL via INTRAVENOUS

## 2023-09-22 MED ORDER — MELATONIN 3 MG PO TABS: 3.0000 mg | ORAL_TABLET | Freq: Every evening | ORAL | Status: DC | PRN

## 2023-09-22 MED ORDER — ACETAMINOPHEN 325 MG PO TABS
650.0000 mg | ORAL_TABLET | Freq: Four times a day (QID) | ORAL | Status: DC | PRN
  Administered 2023-09-22: 650 mg via ORAL
  Filled 2023-09-22: qty 2

## 2023-09-22 NOTE — Progress Notes (Signed)
  Carryover admission to the Day Admitter.  I discussed this case with the EDP, Dr. Griselda.  Per these discussions:   This is a 28 year old female with history of moderate persistent asthma, essential hypertension, who is being admitted with hypertensive crisis after presenting with 3 days of blurry vision as well as 1 to 2 days of left lower extremity numbness in addition to headache.  She conveys that she missed her most recent dose of outpatient diltiazem .   Presenting systolic blood pressure noted to be 240, which is improved to 170 following a single dose of 10 mg of IV hydralazine .  Following improvement in blood pressure, the patient conveys that her headache and left lower extremity numbness have resolved.  Responding heart rates in the range of 70s to 90s.  MRI of the brain showed no evidence of acute process, including no evidence of acute infarct.  Her presenting troponin was mildly elevated but flat, and found to be 29 x 2 occurrences.  She is not experienced any recent chest pain.  Additional labs notable for presenting potassium level 3.2, prompting administration of 20 mill equivalents of oral potassium chloride  x 1 dose.   I have placed an order for observation for further evaluation management of presenting hypertensive crisis.  I have placed some additional preliminary admit orders via the adult multi-morbid admission order set. I have resumed her home to daily oral diltiazem , and have also ordered as needed IV hydralazine  for systolic blood pressure greater than 170 mmHg. also ordered an additional 40 mill equivalents of oral potassium chloride  x 1 dose, and ordered morning labs in the form of CMP, CBC, and magnesium  level.    Eva Pore, DO Hospitalist

## 2023-09-22 NOTE — ED Notes (Signed)
 [phone] [facility] MOM CALLED FOR PDATE

## 2023-09-22 NOTE — Hospital Course (Addendum)
 SABRA

## 2023-09-22 NOTE — ED Notes (Signed)
 Pt ambulated in room and endorses slight weakness, denies lightheadednedd or dizziness.

## 2023-09-22 NOTE — Final Consult Note (Signed)
 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, retail with a pretty stressful job-her supervisor was laid off and she has been taking on more responsibilities for the past 2 weeks culminating in increased frontal headaches as well as neck pain over the past 3 days with little bit of blurred vision She received her dose of Cardizem  in the emergency room which she had not been taking and 2 doses of hydralazine  She did not like the hydralazine  it caused her to have some diarrhea but her blood pressures come down nicely over her emergency room stay in the 150s over 90s  She had an MRI of the brain without evidence of acute process no infarct  troponin was slightly elevated at 29-her EKG has no damage pattern compared to the EKG 02/17/2023 Her labs are stable with BUN/creatinine 14/0.7 she has a little bit of a white count at 15 but no other symptoms and a chest x-ray is negative  Review of systems overall was negative except for diarrhea No dysuria no leukorrhea no nausea no vomiting no chest pain Headache is bandlike in the front no blurred double vision She has no cough no cold She has no dysuria She has no other real symptoms no rash  She was ambulatory to the restroom x 2 without issue although felt a little bit weak no dizziness no numbness no other issues  Family history Noncontributory  Allergies None  Social Drinks  occasionally never smoker   BP (!) 162/86 (BP Location: Right Arm)   Pulse 99   Temp 98.5 F (36.9 C) (Oral)   Resp (!) 22   Ht 5' (1.524 m)   Wt 77.1 kg   LMP 09/21/2023 (Exact Date)   SpO2 100%   BMI 33.20 kg/m  Awake alert pleasant coherent thick neck Mallampati 4 slightly hirsute Cannot obtain for endoscopy S1-S2 no murmur no rub no gallop power 5/5 Straight leg raise bilaterally equal Tattoos on lower extremities ROM intact   P Hypertensive urgency on admission to ED  This is resolved as she was able to obtain her Cardizem  She has been advised to be compliant I have refilled this for 6 months and have given her clonidine  in case her blood pressures are above 160/90 Given that she has no endorgan damage etc. I feel it is reasonable for her to follow-up as she is able to obtain the same She does not need hospitalization Leukocytosis no specific cause She has been treated recently for several UTIs-I would hold on treatment for now and have her follow-up with her PCP to repeat labs She is nontoxic-appearing Elevated troponin Significance unclear-appears to be tachycardia EKGs within normal limits Outpatient echo if felt necessary by PCP PCOS since age 78 First-line therapies OCP-given her habitus may elect to start metformin additionally or Aldactone have told her to talk about this with her primary BMI 33 class I obesity Advise diet exercise walking as a way to counter  stress that is going on at work and not to miss meds   From my perspective she is maximally met criteria for discharge from the emergency room does not need to be admitted She looks quite stable She knows to follow-up  > 85 minutes  Jai Germaine Ripp, MD [facility] 6:17 AM

## 2023-09-22 NOTE — ED Notes (Signed)
 Patient transported to MRI

## 2023-09-28 DIAGNOSIS — H3323 Serous retinal detachment, bilateral: Secondary | ICD-10-CM | POA: Diagnosis not present

## 2023-09-28 DIAGNOSIS — H35033 Hypertensive retinopathy, bilateral: Secondary | ICD-10-CM | POA: Diagnosis not present

## 2023-09-28 DIAGNOSIS — H43813 Vitreous degeneration, bilateral: Secondary | ICD-10-CM | POA: Diagnosis not present

## 2023-10-13 DIAGNOSIS — Z6832 Body mass index (BMI) 32.0-32.9, adult: Secondary | ICD-10-CM | POA: Diagnosis not present

## 2023-10-13 DIAGNOSIS — I10 Essential (primary) hypertension: Secondary | ICD-10-CM | POA: Diagnosis not present

## 2023-10-13 DIAGNOSIS — N926 Irregular menstruation, unspecified: Secondary | ICD-10-CM | POA: Diagnosis not present

## 2023-10-17 DIAGNOSIS — Z124 Encounter for screening for malignant neoplasm of cervix: Secondary | ICD-10-CM | POA: Diagnosis not present

## 2023-10-18 DIAGNOSIS — E282 Polycystic ovarian syndrome: Secondary | ICD-10-CM | POA: Diagnosis not present

## 2023-10-18 DIAGNOSIS — N921 Excessive and frequent menstruation with irregular cycle: Secondary | ICD-10-CM | POA: Diagnosis not present

## 2023-10-27 DIAGNOSIS — E282 Polycystic ovarian syndrome: Secondary | ICD-10-CM | POA: Diagnosis not present

## 2023-10-27 DIAGNOSIS — N921 Excessive and frequent menstruation with irregular cycle: Secondary | ICD-10-CM | POA: Diagnosis not present

## 2023-10-27 DIAGNOSIS — Z113 Encounter for screening for infections with a predominantly sexual mode of transmission: Secondary | ICD-10-CM | POA: Diagnosis not present

## 2023-11-09 ENCOUNTER — Other Ambulatory Visit: Payer: Self-pay

## 2023-11-09 DIAGNOSIS — I10 Essential (primary) hypertension: Secondary | ICD-10-CM | POA: Diagnosis not present

## 2023-11-09 DIAGNOSIS — J4550 Severe persistent asthma, uncomplicated: Secondary | ICD-10-CM | POA: Diagnosis not present

## 2023-11-09 MED ORDER — AIRSUPRA 90-80 MCG/ACT IN AERO: 2.0000 | INHALATION_SPRAY | RESPIRATORY_TRACT | 0 refills | Status: AC | PRN

## 2023-11-14 ENCOUNTER — Ambulatory Visit (HOSPITAL_COMMUNITY)
Admission: EM | Admit: 2023-11-14 | Discharge: 2023-11-14 | Disposition: A | Discharge status: Home / Self Care | Attending: Emergency Medicine | Admitting: Emergency Medicine

## 2023-11-14 ENCOUNTER — Encounter (HOSPITAL_COMMUNITY): Payer: Self-pay

## 2023-11-14 DIAGNOSIS — N898 Other specified noninflammatory disorders of vagina: Secondary | ICD-10-CM | POA: Diagnosis not present

## 2023-11-14 DIAGNOSIS — I10 Essential (primary) hypertension: Secondary | ICD-10-CM | POA: Diagnosis not present

## 2023-11-14 DIAGNOSIS — Z113 Encounter for screening for infections with a predominantly sexual mode of transmission: Secondary | ICD-10-CM | POA: Diagnosis not present

## 2023-11-14 LAB — HIV ANTIBODY (ROUTINE TESTING W REFLEX): HIV Screen 4th Generation wRfx: NONREACTIVE

## 2023-11-14 LAB — POCT URINE PREGNANCY: Preg Test, Ur: NEGATIVE

## 2023-11-14 MED ORDER — METRONIDAZOLE 500 MG PO TABS: 500.0000 mg | ORAL_TABLET | Freq: Two times a day (BID) | ORAL | 0 refills | Status: AC

## 2023-11-14 NOTE — ED Triage Notes (Addendum)
 Patient requesting Std testing, pregnancy testing, and to be checked for BV. Currently having vaginal odor, vaginal white discharge, denies any pain, urinary symptoms or itching.

## 2023-11-14 NOTE — ED Provider Notes (Signed)
 [facility]    CSN: [id number] Arrival date & time: 11/14/23  1814      History   Chief Complaint Chief Complaint  Patient presents with   Vaginal Discharge    HPI Kaitlyn Mcneil is a 28 y.o. female.   Patient presents to clinic over concern of malodorous vaginal discharge for the past 3 days or so.  She did recently learned that her sexual partner has been unfaithful and would like to have full STI screening.  Is having increased vaginal discharge.  Denies any abdominal pain, dysuria, urgency or frequency.  Blood pressure is elevated, patient reports this is at baseline.  Denies headache, chest pain or vision changes.  Did see her PCP last week who made some medication adjustments, she has not yet started these.  The history is provided by the patient and medical records.  Vaginal Discharge   Past Medical History:  Diagnosis Date   Asthma     Patient Active Problem List   Diagnosis Date Noted   Hypertensive crisis 09/22/2023   Environmental allergies 10/27/2022   Hypertension 10/27/2022   Respiratory failure (HCC) 02/25/2022   Severe sepsis (HCC) 02/20/2022   Sinus tachycardia 02/20/2022   Delirium 02/20/2022   Generalized weakness 02/20/2022   Obesity (BMI 30-39.9) 02/20/2022   Lactic acidosis 02/20/2022   Severe asthma without complication 02/18/2022   Acute respiratory failure with hypoxia and hypercapnia (HCC) 02/18/2022   Acute metabolic encephalopathy 02/18/2022   Acute respiratory distress 02/07/2022    Past Surgical History:  Procedure Laterality Date   CANNULATION FOR ECMO (EXTRACORPOREAL MEMBRANE OXYGENATION) N/A 02/07/2022   Procedure: CANNULATION FOR ECMO (EXTRACORPOREAL MEMBRANE OXYGENATION);  Surgeon: Cherrie Toribio SAUNDERS, MD;  Location: [facility] OR;  Service: Cardiovascular;  Laterality: N/A;   ECMO CANNULATION N/A 02/07/2022   Procedure: ECMO CANNULATION;  Surgeon: Cherrie Toribio SAUNDERS, MD;  Location: [facility] INVASIVE [facility] LAB;  Service:  Cardiovascular;  Laterality: N/A;   HERNIA REPAIR  2004   TEE WITHOUT CARDIOVERSION N/A 02/07/2022   Procedure: TRANSESOPHAGEAL ECHOCARDIOGRAM;  Surgeon: Cherrie Toribio SAUNDERS, MD;  Location: [facility] INVASIVE CV LAB;  Service: Cardiovascular;  Laterality: N/A;    OB History   No obstetric history on file.      Home Medications    Prior to Admission medications   Medication Sig Start Date End Date Taking? Authorizing Provider  AIRSUPRA  90-80 MCG/ACT AERO Inhale 2 puffs into the lungs as needed (every 6 hours for cough, wheeze, shortness of breath. Rinse, gargle, and spit after use.). 11/09/23  Yes Kozlow, Camellia PARAS, MD  budesonide  (PULMICORT ) 0.5 MG/2ML nebulizer solution Take 2 mLs (0.5 mg total) by nebulization every 6 (six) hours as needed. Mix with albuterol  during flare ups. 12/20/22  Yes Kozlow, Camellia PARAS, MD  cetirizine  (ZYRTEC ) 10 MG tablet Take 1 tablet (10 mg total) by mouth daily as needed (Can take an extra dose during flare ups.). 04/18/23  Yes Kozlow, Camellia PARAS, MD  cloNIDine  (CATAPRES ) 0.1 MG tablet Take 1 tablet if you find your blood pressure persistently above 160/90-recheck your blood pressure subsequently and if still elevated over a blood pressure of 200 systolic-present to the emergency room 09/22/23  Yes Samtani, Jai-Gurmukh, MD  diltiazem  (CARDIZEM  CD) 180 MG 24 hr capsule Take 1 capsule (180 mg total) by mouth daily. 09/22/23  Yes Samtani, Jai-Gurmukh, MD  Dupilumab  (DUPIXENT ) 300 MG/2ML SOAJ Inject 300 mg into the skin every 14 (fourteen) days. 07/04/23  Yes Hunsucker, Donnice SAUNDERS, MD  EPINEPHrine  0.3 mg/0.3  mL IJ SOAJ injection Inject 0.3 mg into the muscle as needed for anaphylaxis. Call 9-1-1 after use. May use second pen if needed 04/18/23  Yes Kozlow, Camellia PARAS, MD  ipratropium-albuterol  (DUONEB) 0.5-2.5 (3) MG/3ML SOLN Take 3 mLs by nebulization every 6 (six) hours as needed. Mix with budesonide  during flare ups. 01/24/23  Yes Kozlow, Eric J, MD  metFORMIN (GLUCOPHAGE-XR) 500 MG 24 hr  tablet Take 500 mg by mouth. 11/10/23  Yes [provider]  mometasone -formoterol  (DULERA ) 200-5 MCG/ACT AERO Inhale 2 puffs into the lungs 2 (two) times daily. Patient taking differently: Inhale 2 puffs into the lungs every morning. 04/18/23  Yes Kozlow, Camellia PARAS, MD  montelukast  (SINGULAIR ) 10 MG tablet Take 1 tablet (10 mg total) by mouth at bedtime. 04/18/23  Yes Kozlow, Camellia PARAS, MD  pimecrolimus  (ELIDEL ) 1 % cream Apply 1%  topically 2(TWO) times a day as needed to face. Patient taking differently: Apply 1 Application topically 2 (two) times daily as needed (eczema to the face). Apply 1%  topically two times a day as needed to face. 05/19/23  Yes Kozlow, Eric J, MD  Probiotic Product (CULTURELLE PRO & PREBIOTIC PO) Take 1 capsule by mouth 2 (two) times daily after a meal. Take one capsule by mouth daily after breakfast and after dinner.   Yes [provider]  Tiotropium Bromide  Monohydrate (SPIRIVA  RESPIMAT) 2.5 MCG/ACT AERS Inhale 2 puffs into the lungs every morning. 04/18/23  Yes Kozlow, Eric J, MD  triamcinolone  ointment (KENALOG ) 0.1 % Apply 1 Application topically daily as needed (eczema flares). Can use below the neck. Avoid armpits and groin. 04/18/23  Yes Kozlow, Camellia PARAS, MD    Family History Family History  Problem Relation Age of Onset   Thyroid  nodules Mother    High blood pressure Mother    Allergic rhinitis Father    Asthma Father    Eczema Sister    Asthma Brother     Social History Social History   Tobacco Use   Smoking status: Never    Passive exposure: Current   Smokeless tobacco: Never  Vaping Use   Vaping status: Former   Quit date: 12/02/2021   Substances: Flavoring  Substance Use Topics   Alcohol use: Yes    Comment: Social   Drug use: Never     Allergies   Beta adrenergic blockers, Shellfish allergy, and Tomato   Review of Systems Review of Systems  Per HPI  Physical Exam Triage Vital Signs ED Triage Vitals  Encounter Vitals  Group     BP 11/14/23 1907 (!) 213/134     Girls Systolic BP Percentile --      Girls Diastolic BP Percentile --      Boys Systolic BP Percentile --      Boys Diastolic BP Percentile --      Pulse Rate 11/14/23 1907 93     Resp 11/14/23 1907 18     Temp 11/14/23 1907 99.3 F (37.4 C)     Temp Source 11/14/23 1907 Oral     SpO2 11/14/23 1907 98 %     Weight --      Height 11/14/23 1900 5' (1.524 m)     Head Circumference --      Peak Flow --      Pain Score 11/14/23 1901 0     Pain Loc --      Pain Education --      Exclude from Growth Chart --    No  data found.  Updated Vital Signs BP (!) 213/134 (BP Location: Right Arm)   Pulse 93   Temp 99.3 F (37.4 C) (Oral)   Resp 18   Ht 5' (1.524 m)   LMP 10/30/2023 (Within Weeks)   SpO2 98%   BMI 33.20 kg/m   Visual Acuity Right Eye Distance:   Left Eye Distance:   Bilateral Distance:    Right Eye Near:   Left Eye Near:    Bilateral Near:     Physical Exam Vitals and nursing note reviewed.  Constitutional:      Appearance: Normal appearance.  HENT:     Head: Normocephalic and atraumatic.     Right Ear: External ear normal.     Left Ear: External ear normal.     Nose: Nose normal.     Mouth/Throat:     Mouth: Mucous membranes are moist.  Eyes:     Conjunctiva/sclera: Conjunctivae normal.  Cardiovascular:     Rate and Rhythm: Normal rate.  Pulmonary:     Effort: Pulmonary effort is normal. No respiratory distress.  Neurological:     General: No focal deficit present.     Mental Status: She is alert and oriented to person, place, and time.  Psychiatric:        Mood and Affect: Mood normal.        Behavior: Behavior normal.      UC Treatments / Results  Labs (all labs ordered are listed, but only abnormal results are displayed) Labs Reviewed  RPR  HIV ANTIBODY (ROUTINE TESTING W REFLEX)  POCT URINE PREGNANCY  CERVICOVAGINAL ANCILLARY ONLY    EKG   Radiology No results  found.  Procedures Procedures (including critical care time)  Medications Ordered in UC Medications - No data to display  Initial Impression / Assessment and Plan / UC Course  I have reviewed the triage vital signs and the nursing notes.  Pertinent labs & imaging results that were available during my care of the patient were reviewed by me and considered in my medical decision making (see chart for details).  Vitals and triage reviewed, patient is hemodynamically stable.  Blood pressure is quite elevated, asymptomatic.  Followed with PCP.  Strict emergency precautions given if symptoms evolve.  Urine pregnancy negative.  Malodorous vaginal discharge concerning for BV, will treat empirically with Flagyl.  STI screening obtained and several contact if additional treatment is needed.  Plan of care, follow-up care return precautions given, no questions at this time.    Final Clinical Impressions(s) / UC Diagnoses   Final diagnoses:  Vaginal discharge  Essential hypertension     Discharge Instructions      We are treating you empirically for bacterial vaginosis with Flagyl.  Take this twice daily with food.  Do not drink alcohol while taking this medication.  Results from sexually-transmitted infection screening will be back over the next few days and to be contacted if additional treatment is needed.  Abstain from intercourse until results have been received.     ED Prescriptions   None    PDMP not reviewed this encounter.   Dreama, Godwin Tedesco  N, FNP 11/14/23 1932

## 2023-11-14 NOTE — Discharge Instructions (Addendum)
 We are treating you empirically for bacterial vaginosis with Flagyl.  Take this twice daily with food.  Do not drink alcohol while taking this medication.  Results from sexually-transmitted infection screening will be back over the next few days and to be contacted if additional treatment is needed.  Abstain from intercourse until results have been received.

## 2023-11-15 ENCOUNTER — Ambulatory Visit (HOSPITAL_COMMUNITY): Payer: Self-pay

## 2023-11-15 LAB — CERVICOVAGINAL ANCILLARY ONLY
Bacterial Vaginitis (gardnerella): POSITIVE — AB
Candida Glabrata: NEGATIVE
Candida Vaginitis: NEGATIVE
Chlamydia: NEGATIVE
Comment: NEGATIVE
Comment: NEGATIVE
Comment: NEGATIVE
Comment: NEGATIVE
Comment: NEGATIVE
Comment: NORMAL
Neisseria Gonorrhea: NEGATIVE
Trichomonas: NEGATIVE

## 2023-11-15 LAB — RPR: RPR Ser Ql: NONREACTIVE

## 2023-11-23 DIAGNOSIS — H3323 Serous retinal detachment, bilateral: Secondary | ICD-10-CM | POA: Diagnosis not present

## 2023-11-23 DIAGNOSIS — H35033 Hypertensive retinopathy, bilateral: Secondary | ICD-10-CM | POA: Diagnosis not present

## 2023-11-23 DIAGNOSIS — H43813 Vitreous degeneration, bilateral: Secondary | ICD-10-CM | POA: Diagnosis not present

## 2023-12-08 DIAGNOSIS — Z6832 Body mass index (BMI) 32.0-32.9, adult: Secondary | ICD-10-CM | POA: Diagnosis not present

## 2023-12-08 DIAGNOSIS — I10 Essential (primary) hypertension: Secondary | ICD-10-CM | POA: Diagnosis not present

## 2023-12-08 DIAGNOSIS — J4550 Severe persistent asthma, uncomplicated: Secondary | ICD-10-CM | POA: Diagnosis not present

## 2024-01-23 ENCOUNTER — Telehealth: Payer: Self-pay | Admitting: Allergy and Immunology

## 2024-01-23 NOTE — Telephone Encounter (Signed)
 PT called stating she needs refills on Duoneb, Airsupra , Spiriva  Respimat and Prednisone . To send those 4 prescriptions to her pharmacy.

## 2024-01-23 NOTE — Telephone Encounter (Signed)
 Attempted to gather more information from the patient since it has almost been a year since we have last seen her in office. I attempted to call, there was no answer and mailbox was full. Will need to attempt to call again.

## 2024-01-25 ENCOUNTER — Other Ambulatory Visit: Payer: Self-pay | Admitting: *Deleted

## 2024-01-25 MED ORDER — BUDESONIDE 0.5 MG/2ML IN SUSP: 0.5000 mg | Freq: Four times a day (QID) | RESPIRATORY_TRACT | 0 refills | Status: AC | PRN

## 2024-01-25 MED ORDER — ALBUTEROL SULFATE HFA 108 (90 BASE) MCG/ACT IN AERS: 2.0000 | INHALATION_SPRAY | RESPIRATORY_TRACT | 0 refills | Status: AC | PRN

## 2024-01-25 MED ORDER — IPRATROPIUM-ALBUTEROL 0.5-2.5 (3) MG/3ML IN SOLN: 3.0000 mL | Freq: Four times a day (QID) | RESPIRATORY_TRACT | 0 refills | Status: AC | PRN

## 2024-01-25 MED ORDER — MOMETASONE FURO-FORMOTEROL FUM 200-5 MCG/ACT IN AERO: 2.0000 | INHALATION_SPRAY | Freq: Two times a day (BID) | RESPIRATORY_TRACT | 0 refills | Status: AC

## 2024-01-25 MED ORDER — SPIRIVA RESPIMAT 2.5 MCG/ACT IN AERS: 2.0000 | INHALATION_SPRAY | Freq: Every morning | RESPIRATORY_TRACT | 0 refills | Status: AC

## 2024-01-25 NOTE — Telephone Encounter (Signed)
 I called the patient and advised that it has almost been a year since we have seen her. I did inform her that I could send in courtesy refills for everything except for the prednisone . Patient verbalized understanding. She stated that she currently has an asthma flare and is sick, she tested negative for COVID but fears she may have tested too soon. I advised that I would go ahead and send in courtesy refills of her medication and scheduled an office visit for her to come in and see us  next week. I did advise that if her breathing gets worse to please call us  or proceed to the nearest emergency room. Patient verbalized understanding. Courtesy refills have been sent in.

## 2024-02-01 ENCOUNTER — Ambulatory Visit: Admitting: Family Medicine

## 2024-02-16 ENCOUNTER — Ambulatory Visit: Admitting: General Practice
